# Patient Record
Sex: Female | Born: 1970 | Race: White | Hispanic: No | Smoking: Former smoker
Health system: Southern US, Community
[De-identification: ages and names within clinical notes are randomized; demographics above are authoritative.]

## PROBLEM LIST (undated history)

## (undated) DIAGNOSIS — R102 Pelvic and perineal pain: Secondary | ICD-10-CM

## (undated) DIAGNOSIS — Z973 Presence of spectacles and contact lenses: Secondary | ICD-10-CM

## (undated) DIAGNOSIS — R350 Frequency of micturition: Secondary | ICD-10-CM

## (undated) DIAGNOSIS — F419 Anxiety disorder, unspecified: Secondary | ICD-10-CM

## (undated) DIAGNOSIS — Z8659 Personal history of other mental and behavioral disorders: Secondary | ICD-10-CM

## (undated) DIAGNOSIS — R3915 Urgency of urination: Secondary | ICD-10-CM

## (undated) DIAGNOSIS — F329 Major depressive disorder, single episode, unspecified: Secondary | ICD-10-CM

## (undated) DIAGNOSIS — L309 Dermatitis, unspecified: Secondary | ICD-10-CM

## (undated) DIAGNOSIS — F411 Generalized anxiety disorder: Secondary | ICD-10-CM

## (undated) DIAGNOSIS — F4310 Post-traumatic stress disorder, unspecified: Secondary | ICD-10-CM

## (undated) DIAGNOSIS — K589 Irritable bowel syndrome without diarrhea: Secondary | ICD-10-CM

## (undated) DIAGNOSIS — F32A Depression, unspecified: Secondary | ICD-10-CM

## (undated) DIAGNOSIS — G629 Polyneuropathy, unspecified: Secondary | ICD-10-CM

## (undated) DIAGNOSIS — E785 Hyperlipidemia, unspecified: Secondary | ICD-10-CM

## (undated) DIAGNOSIS — E119 Type 2 diabetes mellitus without complications: Secondary | ICD-10-CM

## (undated) DIAGNOSIS — K219 Gastro-esophageal reflux disease without esophagitis: Secondary | ICD-10-CM

## (undated) HISTORY — DX: Depression, unspecified: F32.A

## (undated) HISTORY — PX: VAGINAL HYSTERECTOMY: SUR661

## (undated) HISTORY — DX: Hyperlipidemia, unspecified: E78.5

## (undated) HISTORY — DX: Anxiety disorder, unspecified: F41.9

## (undated) HISTORY — PX: GYNECOLOGIC CRYOSURGERY: SHX857

## (undated) HISTORY — DX: Major depressive disorder, single episode, unspecified: F32.9

---

## 1997-11-17 ENCOUNTER — Other Ambulatory Visit: Admission: RE | Admit: 1997-11-17 | Discharge: 1997-11-17 | Payer: Self-pay | Admitting: Obstetrics and Gynecology

## 2000-04-02 ENCOUNTER — Other Ambulatory Visit: Admission: RE | Admit: 2000-04-02 | Discharge: 2000-04-02 | Payer: Self-pay | Admitting: Obstetrics and Gynecology

## 2000-06-10 ENCOUNTER — Ambulatory Visit: Admission: RE | Admit: 2000-06-10 | Discharge: 2000-06-10 | Payer: Self-pay | Admitting: Internal Medicine

## 2000-10-31 ENCOUNTER — Inpatient Hospital Stay (HOSPITAL_COMMUNITY): Admission: AD | Admit: 2000-10-31 | Discharge: 2000-10-31 | Payer: Self-pay | Admitting: Obstetrics and Gynecology

## 2000-11-14 ENCOUNTER — Ambulatory Visit (HOSPITAL_COMMUNITY): Admission: RE | Admit: 2000-11-14 | Discharge: 2000-11-14 | Payer: Self-pay | Admitting: Obstetrics and Gynecology

## 2000-12-06 ENCOUNTER — Ambulatory Visit (HOSPITAL_COMMUNITY): Admission: RE | Admit: 2000-12-06 | Discharge: 2000-12-06 | Payer: Self-pay | Admitting: Obstetrics and Gynecology

## 2000-12-06 ENCOUNTER — Encounter: Payer: Self-pay | Admitting: Obstetrics and Gynecology

## 2001-02-06 ENCOUNTER — Ambulatory Visit (HOSPITAL_COMMUNITY): Admission: AD | Admit: 2001-02-06 | Discharge: 2001-02-06 | Payer: Self-pay | Admitting: Obstetrics and Gynecology

## 2001-02-22 ENCOUNTER — Ambulatory Visit (HOSPITAL_COMMUNITY): Admission: RE | Admit: 2001-02-22 | Discharge: 2001-02-22 | Payer: Self-pay | Admitting: Obstetrics and Gynecology

## 2001-02-22 ENCOUNTER — Encounter: Payer: Self-pay | Admitting: Obstetrics and Gynecology

## 2001-03-03 ENCOUNTER — Inpatient Hospital Stay (HOSPITAL_COMMUNITY): Admission: AD | Admit: 2001-03-03 | Discharge: 2001-03-03 | Payer: Self-pay | Admitting: Obstetrics and Gynecology

## 2001-04-01 ENCOUNTER — Inpatient Hospital Stay (HOSPITAL_COMMUNITY): Admission: AD | Admit: 2001-04-01 | Discharge: 2001-04-01 | Payer: Self-pay | Admitting: Obstetrics and Gynecology

## 2001-04-05 ENCOUNTER — Inpatient Hospital Stay (HOSPITAL_COMMUNITY): Admission: AD | Admit: 2001-04-05 | Discharge: 2001-04-05 | Payer: Self-pay | Admitting: Obstetrics and Gynecology

## 2001-04-05 ENCOUNTER — Encounter: Payer: Self-pay | Admitting: Obstetrics and Gynecology

## 2001-05-03 ENCOUNTER — Inpatient Hospital Stay (HOSPITAL_COMMUNITY): Admission: AD | Admit: 2001-05-03 | Discharge: 2001-05-05 | Payer: Self-pay | Admitting: Obstetrics and Gynecology

## 2001-05-03 ENCOUNTER — Encounter (INDEPENDENT_AMBULATORY_CARE_PROVIDER_SITE_OTHER): Payer: Self-pay | Admitting: Specialist

## 2001-05-04 HISTORY — PX: TUBAL LIGATION: SHX77

## 2001-06-13 ENCOUNTER — Other Ambulatory Visit: Admission: RE | Admit: 2001-06-13 | Discharge: 2001-06-13 | Payer: Self-pay | Admitting: Obstetrics and Gynecology

## 2002-07-18 ENCOUNTER — Encounter (INDEPENDENT_AMBULATORY_CARE_PROVIDER_SITE_OTHER): Payer: Self-pay | Admitting: Specialist

## 2002-07-18 ENCOUNTER — Inpatient Hospital Stay (HOSPITAL_COMMUNITY): Admission: EM | Admit: 2002-07-18 | Discharge: 2002-07-19 | Payer: Self-pay | Admitting: Emergency Medicine

## 2002-07-18 ENCOUNTER — Encounter: Payer: Self-pay | Admitting: Emergency Medicine

## 2002-07-18 ENCOUNTER — Encounter: Payer: Self-pay | Admitting: General Surgery

## 2002-07-18 HISTORY — PX: LAPAROSCOPIC CHOLECYSTECTOMY: SUR755

## 2003-07-13 ENCOUNTER — Other Ambulatory Visit: Admission: RE | Admit: 2003-07-13 | Discharge: 2003-07-13 | Payer: Self-pay | Admitting: Obstetrics and Gynecology

## 2004-02-28 ENCOUNTER — Ambulatory Visit (HOSPITAL_COMMUNITY): Admission: RE | Admit: 2004-02-28 | Discharge: 2004-02-28 | Payer: Self-pay | Admitting: Internal Medicine

## 2004-07-11 ENCOUNTER — Other Ambulatory Visit: Admission: RE | Admit: 2004-07-11 | Discharge: 2004-07-11 | Payer: Self-pay | Admitting: Obstetrics and Gynecology

## 2005-02-24 ENCOUNTER — Encounter: Admission: RE | Admit: 2005-02-24 | Discharge: 2005-02-24 | Payer: Self-pay | Admitting: Family Medicine

## 2005-06-26 DIAGNOSIS — E119 Type 2 diabetes mellitus without complications: Secondary | ICD-10-CM | POA: Insufficient documentation

## 2005-11-24 ENCOUNTER — Ambulatory Visit: Payer: Self-pay | Admitting: Family Medicine

## 2005-11-30 ENCOUNTER — Ambulatory Visit: Payer: Self-pay | Admitting: Family Medicine

## 2005-12-14 ENCOUNTER — Encounter: Admission: RE | Admit: 2005-12-14 | Discharge: 2006-03-14 | Payer: Self-pay | Admitting: Family Medicine

## 2006-01-01 ENCOUNTER — Ambulatory Visit: Payer: Self-pay | Admitting: Family Medicine

## 2006-01-15 ENCOUNTER — Ambulatory Visit: Payer: Self-pay | Admitting: Family Medicine

## 2006-03-04 ENCOUNTER — Inpatient Hospital Stay (HOSPITAL_COMMUNITY): Admission: AD | Admit: 2006-03-04 | Discharge: 2006-03-04 | Payer: Self-pay | Admitting: Obstetrics and Gynecology

## 2006-03-20 ENCOUNTER — Ambulatory Visit: Payer: Self-pay | Admitting: Family Medicine

## 2006-05-29 ENCOUNTER — Ambulatory Visit: Payer: Self-pay | Admitting: Family Medicine

## 2006-07-09 ENCOUNTER — Encounter: Admission: RE | Admit: 2006-07-09 | Discharge: 2006-07-09 | Payer: Self-pay | Admitting: Family Medicine

## 2006-07-09 ENCOUNTER — Ambulatory Visit: Payer: Self-pay | Admitting: Family Medicine

## 2006-10-09 ENCOUNTER — Ambulatory Visit: Payer: Self-pay | Admitting: Family Medicine

## 2006-11-28 ENCOUNTER — Ambulatory Visit: Payer: Self-pay | Admitting: Family Medicine

## 2007-03-13 ENCOUNTER — Ambulatory Visit: Payer: Self-pay | Admitting: Family Medicine

## 2007-03-15 ENCOUNTER — Ambulatory Visit: Payer: Self-pay | Admitting: Family Medicine

## 2007-05-01 ENCOUNTER — Ambulatory Visit: Payer: Self-pay | Admitting: Family Medicine

## 2007-07-12 ENCOUNTER — Ambulatory Visit: Payer: Self-pay | Admitting: Family Medicine

## 2007-07-12 ENCOUNTER — Encounter: Admission: RE | Admit: 2007-07-12 | Discharge: 2007-07-12 | Payer: Self-pay | Admitting: Family Medicine

## 2007-07-17 ENCOUNTER — Ambulatory Visit: Payer: Self-pay | Admitting: Family Medicine

## 2007-07-22 ENCOUNTER — Ambulatory Visit: Payer: Self-pay | Admitting: Family Medicine

## 2007-07-22 ENCOUNTER — Encounter: Admission: RE | Admit: 2007-07-22 | Discharge: 2007-07-22 | Payer: Self-pay | Admitting: Family Medicine

## 2007-07-24 ENCOUNTER — Encounter: Admission: RE | Admit: 2007-07-24 | Discharge: 2007-07-24 | Payer: Self-pay | Admitting: Family Medicine

## 2007-10-01 ENCOUNTER — Ambulatory Visit: Payer: Self-pay | Admitting: Family Medicine

## 2007-10-14 ENCOUNTER — Ambulatory Visit: Payer: Self-pay | Admitting: Family Medicine

## 2007-10-31 ENCOUNTER — Ambulatory Visit: Payer: Self-pay | Admitting: Family Medicine

## 2007-11-14 ENCOUNTER — Ambulatory Visit: Payer: Self-pay | Admitting: Family Medicine

## 2007-11-19 ENCOUNTER — Ambulatory Visit: Payer: Self-pay | Admitting: Family Medicine

## 2008-02-20 ENCOUNTER — Ambulatory Visit: Payer: Self-pay | Admitting: Family Medicine

## 2008-04-02 ENCOUNTER — Ambulatory Visit: Payer: Self-pay | Admitting: Family Medicine

## 2008-06-12 ENCOUNTER — Ambulatory Visit: Payer: Self-pay | Admitting: Family Medicine

## 2008-08-21 ENCOUNTER — Ambulatory Visit: Payer: Self-pay | Admitting: Nurse Practitioner

## 2008-08-21 DIAGNOSIS — F410 Panic disorder [episodic paroxysmal anxiety] without agoraphobia: Secondary | ICD-10-CM | POA: Insufficient documentation

## 2008-08-21 DIAGNOSIS — F17200 Nicotine dependence, unspecified, uncomplicated: Secondary | ICD-10-CM | POA: Insufficient documentation

## 2008-08-21 DIAGNOSIS — E7800 Pure hypercholesterolemia, unspecified: Secondary | ICD-10-CM | POA: Insufficient documentation

## 2008-08-21 DIAGNOSIS — J209 Acute bronchitis, unspecified: Secondary | ICD-10-CM | POA: Insufficient documentation

## 2008-08-21 DIAGNOSIS — F341 Dysthymic disorder: Secondary | ICD-10-CM | POA: Insufficient documentation

## 2008-08-21 LAB — CONVERTED CEMR LAB
Blood in Urine, dipstick: NEGATIVE
Glucose, Urine, Semiquant: NEGATIVE
Ketones, urine, test strip: NEGATIVE
Nitrite: NEGATIVE
Urobilinogen, UA: 0.2
WBC Urine, dipstick: NEGATIVE

## 2008-08-24 ENCOUNTER — Encounter (INDEPENDENT_AMBULATORY_CARE_PROVIDER_SITE_OTHER): Payer: Self-pay | Admitting: Nurse Practitioner

## 2008-08-24 LAB — CONVERTED CEMR LAB
BUN: 17 mg/dL (ref 6–23)
CO2: 25 meq/L (ref 19–32)
Calcium: 9.2 mg/dL (ref 8.4–10.5)
Chloride: 104 meq/L (ref 96–112)
Creatinine, Ser: 0.73 mg/dL (ref 0.40–1.20)
Glucose, Bld: 203 mg/dL — ABNORMAL HIGH (ref 70–99)
Hemoglobin: 13.6 g/dL (ref 12.0–15.0)
Neutrophils Relative %: 53 % (ref 43–77)
Platelets: 197 10*3/uL (ref 150–400)
Potassium: 4.5 meq/L (ref 3.5–5.3)
RBC: 4.34 M/uL (ref 3.87–5.11)
RDW: 12.8 % (ref 11.5–15.5)
TSH: 0.652 microintl units/mL (ref 0.350–4.50)
Total Bilirubin: 0.3 mg/dL (ref 0.3–1.2)
WBC: 7.1 10*3/uL (ref 4.0–10.5)

## 2008-08-28 ENCOUNTER — Ambulatory Visit: Payer: Self-pay | Admitting: Nurse Practitioner

## 2008-09-01 ENCOUNTER — Encounter (INDEPENDENT_AMBULATORY_CARE_PROVIDER_SITE_OTHER): Payer: Self-pay | Admitting: Nurse Practitioner

## 2008-09-11 ENCOUNTER — Ambulatory Visit: Payer: Self-pay | Admitting: Nurse Practitioner

## 2008-09-11 LAB — CONVERTED CEMR LAB
Cholesterol, target level: 200 mg/dL
HDL goal, serum: 40 mg/dL
LDL Goal: 100 mg/dL

## 2008-09-24 ENCOUNTER — Ambulatory Visit: Payer: Self-pay | Admitting: Nurse Practitioner

## 2008-09-28 ENCOUNTER — Encounter (INDEPENDENT_AMBULATORY_CARE_PROVIDER_SITE_OTHER): Payer: Self-pay | Admitting: Nurse Practitioner

## 2008-09-28 LAB — CONVERTED CEMR LAB
Cholesterol: 196 mg/dL (ref 0–200)
HDL: 48 mg/dL (ref >39)
LDL Cholesterol: 127 mg/dL — ABNORMAL HIGH (ref 0–99)
Total CHOL/HDL Ratio: 4.1
Triglycerides: 107 mg/dL (ref <150)
VLDL: 21 mg/dL (ref 0–40)

## 2008-10-21 ENCOUNTER — Encounter (INDEPENDENT_AMBULATORY_CARE_PROVIDER_SITE_OTHER): Payer: Self-pay | Admitting: Nurse Practitioner

## 2008-10-22 ENCOUNTER — Encounter (INDEPENDENT_AMBULATORY_CARE_PROVIDER_SITE_OTHER): Payer: Self-pay | Admitting: Nurse Practitioner

## 2008-11-12 ENCOUNTER — Ambulatory Visit: Payer: Self-pay | Admitting: Nurse Practitioner

## 2008-11-12 LAB — CONVERTED CEMR LAB: Blood Glucose, Fingerstick: 68

## 2008-11-27 ENCOUNTER — Telehealth (INDEPENDENT_AMBULATORY_CARE_PROVIDER_SITE_OTHER): Payer: Self-pay | Admitting: Nurse Practitioner

## 2008-11-27 ENCOUNTER — Ambulatory Visit: Payer: Self-pay | Admitting: Nurse Practitioner

## 2008-11-27 DIAGNOSIS — R109 Unspecified abdominal pain: Secondary | ICD-10-CM | POA: Insufficient documentation

## 2008-11-27 LAB — CONVERTED CEMR LAB
Blood Glucose, Fingerstick: 90
Ketones, urine, test strip: NEGATIVE
Nitrite: NEGATIVE
Specific Gravity, Urine: >=1.03
Urobilinogen, UA: 0.2
WBC Urine, dipstick: NEGATIVE

## 2008-11-28 ENCOUNTER — Encounter (INDEPENDENT_AMBULATORY_CARE_PROVIDER_SITE_OTHER): Payer: Self-pay | Admitting: Nurse Practitioner

## 2009-01-07 ENCOUNTER — Telehealth (INDEPENDENT_AMBULATORY_CARE_PROVIDER_SITE_OTHER): Payer: Self-pay | Admitting: Nurse Practitioner

## 2009-01-07 ENCOUNTER — Ambulatory Visit: Payer: Self-pay | Admitting: Nurse Practitioner

## 2009-01-07 LAB — CONVERTED CEMR LAB
Albumin: 4.2 g/dL (ref 3.5–5.2)
Alkaline Phosphatase: 51 units/L (ref 39–117)
Bilirubin, Direct: 0.1 mg/dL (ref 0.0–0.3)
HDL: 45 mg/dL (ref >39)
Total Bilirubin: 0.5 mg/dL (ref 0.3–1.2)
Triglycerides: 103 mg/dL (ref <150)
VLDL: 21 mg/dL (ref 0–40)

## 2009-01-08 ENCOUNTER — Encounter (INDEPENDENT_AMBULATORY_CARE_PROVIDER_SITE_OTHER): Payer: Self-pay | Admitting: Nurse Practitioner

## 2009-01-14 ENCOUNTER — Telehealth (INDEPENDENT_AMBULATORY_CARE_PROVIDER_SITE_OTHER): Payer: Self-pay | Admitting: Internal Medicine

## 2009-02-22 ENCOUNTER — Ambulatory Visit: Payer: Self-pay | Admitting: Nurse Practitioner

## 2009-02-22 DIAGNOSIS — R21 Rash and other nonspecific skin eruption: Secondary | ICD-10-CM | POA: Insufficient documentation

## 2009-02-22 LAB — CONVERTED CEMR LAB: Blood Glucose, Fingerstick: 89

## 2009-03-04 ENCOUNTER — Telehealth (INDEPENDENT_AMBULATORY_CARE_PROVIDER_SITE_OTHER): Payer: Self-pay | Admitting: Nurse Practitioner

## 2009-03-05 ENCOUNTER — Ambulatory Visit: Payer: Self-pay | Admitting: Nurse Practitioner

## 2009-03-05 DIAGNOSIS — J309 Allergic rhinitis, unspecified: Secondary | ICD-10-CM | POA: Insufficient documentation

## 2009-04-20 ENCOUNTER — Emergency Department (HOSPITAL_COMMUNITY): Admission: EM | Admit: 2009-04-20 | Discharge: 2009-04-20 | Payer: Self-pay | Admitting: Emergency Medicine

## 2009-04-30 ENCOUNTER — Telehealth (INDEPENDENT_AMBULATORY_CARE_PROVIDER_SITE_OTHER): Payer: Self-pay | Admitting: Nurse Practitioner

## 2009-07-29 ENCOUNTER — Other Ambulatory Visit: Admission: RE | Admit: 2009-07-29 | Discharge: 2009-07-29 | Payer: Self-pay | Admitting: Family Medicine

## 2009-07-29 ENCOUNTER — Ambulatory Visit: Payer: Self-pay | Admitting: Family Medicine

## 2009-10-21 ENCOUNTER — Ambulatory Visit: Payer: Self-pay | Admitting: Family Medicine

## 2009-12-01 ENCOUNTER — Ambulatory Visit: Payer: Self-pay | Admitting: Family Medicine

## 2009-12-01 ENCOUNTER — Encounter: Admission: RE | Admit: 2009-12-01 | Discharge: 2009-12-01 | Payer: Self-pay | Admitting: Family Medicine

## 2010-02-25 ENCOUNTER — Ambulatory Visit: Payer: Self-pay | Admitting: Family Medicine

## 2010-03-22 ENCOUNTER — Ambulatory Visit: Payer: Self-pay | Admitting: Family Medicine

## 2010-05-18 ENCOUNTER — Ambulatory Visit: Payer: Self-pay | Admitting: Family Medicine

## 2010-07-04 ENCOUNTER — Ambulatory Visit
Admission: RE | Admit: 2010-07-04 | Discharge: 2010-07-04 | Payer: Self-pay | Source: Home / Self Care | Attending: Family Medicine | Admitting: Family Medicine

## 2010-07-21 ENCOUNTER — Ambulatory Visit: Admit: 2010-07-21 | Payer: Self-pay | Admitting: Family Medicine

## 2010-07-27 ENCOUNTER — Ambulatory Visit (INDEPENDENT_AMBULATORY_CARE_PROVIDER_SITE_OTHER): Payer: BC Managed Care – PPO | Admitting: Physician Assistant

## 2010-07-27 DIAGNOSIS — J069 Acute upper respiratory infection, unspecified: Secondary | ICD-10-CM

## 2010-07-28 ENCOUNTER — Ambulatory Visit: Payer: Self-pay | Admitting: Family Medicine

## 2010-09-29 LAB — GLUCOSE, CAPILLARY: Glucose-Capillary: 104 mg/dL — ABNORMAL HIGH (ref 70–99)

## 2010-10-19 ENCOUNTER — Encounter: Payer: BC Managed Care – PPO | Admitting: Family Medicine

## 2010-11-10 ENCOUNTER — Encounter: Payer: Self-pay | Admitting: Medical

## 2010-11-11 NOTE — H&P (Signed)
NAME:  [name], [name]                        ACCOUNT NO.:  [id number]   MEDICAL RECORD NO.:  [id number]                   PATIENT TYPE:  INP   LOCATION:  5007                                 FACILITY:  [facility]   PHYSICIAN:  Angelia Mould. Derrell Lolling, [name]             DATE OF BIRTH:  June 29, 1970   DATE OF ADMISSION:  07/18/2002  DATE OF DISCHARGE:                                HISTORY & PHYSICAL   CHIEF COMPLAINT:  Abdominal pain.   HISTORY OF PRESENT ILLNESS:  This is a 40 year old white female who had the  onset of epigastric pain at 10 p.m. last night after eating pizza for  supper.  The pain was steady in character, progressive in intensity, not  colicky.  She had nausea and vomited four times.  She took antacids but with  no relief.  She denies any recent diarrhea or constipation.  She has not had  any prior similar episodes but does have some mild indigestion and fatty  food intolerance.  She denies fever or chills.  She came to the emergency  room because of unrelenting pain.  She has been given narcotic analgesics  and the pain has essentially resolved now.   PAST MEDICAL HISTORY:  1. She had a bilateral tubal ligation.  2. She has had two pregnancies and two vaginal deliveries.  3. She has some anxiety.  4. She states she has a heart murmur and had an echocardiogram by Dr. Jarome Matin, which was unremarkable.  5. She has some mild gastroesophageal reflux disease.   CURRENT MEDICATIONS:  Effexor 225 mg q.d.   ALLERGIES:  CODEINE causes nausea.   SOCIAL HISTORY:  The patient is separated and has two children ages 1 and 1.  She lives in [place].  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  Denies the use of alcohol or tobacco.   FAMILY HISTORY:  Mother living, age 62, has hypertension.  Father living,  age 16, no medical problems.  One brother living and well.  One grandmother  with cancer of the thyroid.   REVIEW OF SYSTEMS:   GENERAL:  She is overweight.  EYES:  No visual problems  or eye problems.  ENT:  Denies any significant oral or pharyngeal problems.  Had mild sore throat yesterday that has resolved.  LUNGS:  Denies cough,  asthma, bronchitis, or other pulmonary problems.  HEART:  History of heart  murmur as above, otherwise asymptomatic.  ABDOMEN:  Mild gastroesophageal  reflux symptoms controlled with Tums.  No history of any ulcer, hepatitis,  liver disease, pancreatitis, colitis, Crohn's disease, or any other GI  problems.  GYN:  No active problems.  MUSCULOSKELETAL:  No joint or bone  pain.  NEUROLOGIC:  No history of seizures or syncopal episodes.   PHYSICAL EXAMINATION:  GENERAL:  Pleasant overweight young white female in  no distress  at this time.  She just received her second dose of IV narcotic.  VITAL SIGNS:  Temperature 97.9, pulse 90, respirations 18, blood pressure  115/61.  EYES:  Sclerae clear.  Extraocular movements intact.  ENT:  Oropharynx clear.  No lesions and no inflammation.  NECK:  Supple, nontender, no mass, no bruit, no jugular venous distention.  HEART:  Regular rhythm and rate, perhaps a very faint systolic murmur.  LUNGS:  Clear to auscultation.  BREASTS:  Not examined.  ABDOMEN:  Somewhat obese, soft, nontender, minimal bowel sounds, not  distended, no mass.  EXTREMITIES:  Free range of motion, no deformity, no edema, good pulses.  NEUROLOGIC:  Grossly within normal limits.   ADMISSION DATA:  ULTRASOUND:  Shows multiple gallstones with several  gallstones impacted in the gallbladder neck.   LABORATORY DATA:  White blood cell count is 12.2.  Liver function tests are  normal.  Serum amylase is 158.  Lipase is normal.   IMPRESSION:  1. Acute cholecystitis with cholelithiasis.  2. History of anxiety.  3. History of benign heart murmur.   PLAN:  1. The patient will be admitted to the hospital and will be taken to the     operating room today for a laparoscopic  cholecystectomy with     intraoperative cholangiogram.  2. I have discussed the indications and details of surgery with her.  The     risks and complications have been outlined, including but not limited to     bleeding, infection, conversion to open laparotomy, injury to the main     bile duct with major reconstructive surgery, injury to adjacent organs     such as the small intestine, large intestine, liver or stomach with     reconstruction, wound problems such as infection or hernia, and other     unforeseen problems.  She seems to understand these issues well.  At this     time, all of her questions are answered.  She would clearly like to go     ahead and have this done now and agrees with this plan.                                               Angelia Mould. Derrell Lolling, [name]    HMI/MEDQ  D:  07/18/2002  T:  07/18/2002  Job:  [id number]   cc:   Barry Dienes. Eloise Harman, [name]  [address]  [place]  [place] [address]  Fax: [phone]

## 2010-11-11 NOTE — Op Note (Signed)
[facility]  Patient:    [name], [name] Visit Number: [id number] MRN: [id number]          Service Type: OBS Location: 910A 9121 01 Attending Physician:  Oliver Pila Dictated by:   Malachi Pro. Ambrose Mantle, [name] Proc. Date: 05/04/01 Admit Date:  05/03/2001 Discharge Date: 05/05/2001                             Operative Report  PREOPERATIVE DIAGNOSES:       Voluntary sterilization.  POSTOPERATIVE DIAGNOSES:      Voluntary sterilization.  PROCEDURE:                    Bilateral tubal ligation.  OPERATOR:                     Malachi Pro. Ambrose Mantle, [name]  ANESTHESIA:                   Epidural.  PROCEDURE:                    The patient was brought to the operating room and placed under satisfactory epidural anesthesia.  The abdomen was prepped with Betadine solution and draped as a sterile field.  The patient was quite anxious, verging on having a panic attack.  We proceeded with the procedure. A semilunar incision was made in the inferior portion of the umbilicus.  It was carried down to the fascia.  The fascia was incised.  Peritoneum was opened.  I visualized both tubes and traced both tubes to their fimbriated ends.  I elevated the mid portion of both tubes, created a window in the mesosalpinx with the Bovie, and then placed two ties of 0 plain catgut proximally and distally in each tube and excised the central segment.  I confirmed that both were tubal segments.  There was no bleeding.  The ties were cut and the abdominal wall was closed.  I only saw a superior portion of the left ovary and a superior portion of the right ovary.  The abdominal wall was closed with interrupted figure-of-eight sutures of 0 Vicryl on the fascia and peritoneum.  I did not close the subcuticular tissue.  I placed 3-0 plain catgut in the skin.  The patient seemed to tolerate the procedure well.  Blood loss was less than 5 cc.  Sponge and needle counts were correct.   She was returned to recovery in satisfactory condition. Dictated by:   Malachi Pro. Ambrose Mantle, [name] Attending Physician:  Oliver Pila DD:  05/04/01 TD:  05/06/01 Job: [id number]

## 2010-11-11 NOTE — H&P (Signed)
[facility]  Patient:    [name], [name] Visit Number: [id number] MRN: [id number]          Service Type: OBS Location: 910A 9121 01 Attending Physician:  Oliver Pila Dictated by:   Malachi Pro. Ambrose Mantle, [name] Admit Date:  05/03/2001 Discharge Date: 05/05/2001                           History and Physical  HISTORY:                      A 40 year old white female para 1-0-0-1, gravida 2 with [facility] May 07, 2001 was scheduled for induction on May 03, 2001. However, she presented to labor and delivery at 1:30 a.m. with contractions every three minutes and the cervix 3 cm dilated.  She had spontaneous rupture of membranes after admission at 4:30 a.m.  Pregnancy was complicated by large for gestational age infant.  Estimated fetal weight by Leopolds was approximately 8.5-9 pounds.  Also had a history of depression.  She stopped Effexor at 14 weeks.  ALLERGIES:                    CODEINE, DOXYCYCLINE.  PAST OBSTETRICAL HISTORY:     Vaginal delivery in 1996 of an 8 pound 9 ounce infant.  PAST GYNECOLOGICAL HISTORY:   Cryo surgery at 40 years of age.  PAST MEDICAL HISTORY:         Depression.  PAST SURGICAL HISTORY:        None.  PHYSICAL EXAMINATION  VITAL SIGNS:                  Normal.  HEENT:                        Normal.  LUNGS:                        Clear.  HEART:                        Normal sinus and sounds.  ABDOMEN:                      Soft.  Fundal height now is postpartum at the umbilicus.  PELVIC:                       Deferred postpartum.  ADMITTING IMPRESSION:         Intrauterine pregnancy that was delivered 9 pound 0 ounce infant.  Voluntary sterilization.  The patient has been counseled thoroughly about the risks and benefits of proceeding with tubal ligation.  I told her that my personal feeling is that a couple should not have sterilization within a year of the delivery of the last child but she wants  to proceed.  She has been counseled again and she continues to want to proceed. Dictated by:   Malachi Pro. Ambrose Mantle, [name] Attending Physician:  Oliver Pila DD:  05/04/01 TD:  05/06/01 Job: [id number]

## 2010-11-11 NOTE — Op Note (Signed)
NAME:  [name], [name]                        ACCOUNT NO.:  [id number]   MEDICAL RECORD NO.:  [id number]                   PATIENT TYPE:  INP   LOCATION:  1845                                 FACILITY:  [facility]   PHYSICIAN:  Angelia Mould. Derrell Lolling, [name]             DATE OF BIRTH:  03/04/1971   DATE OF PROCEDURE:  07/18/2002  DATE OF DISCHARGE:                                 OPERATIVE REPORT   PREOPERATIVE DIAGNOSIS:  Acute cholecystitis with cholelithiasis.   POSTOPERATIVE DIAGNOSIS:  Acute cholecystitis with cholelithiasis.   OPERATION PERFORMED:  Laparoscopic cholecystectomy with intraoperative  cholangiogram.   SURGEON:  Angelia Mould. Derrell Lolling, [name]   FIRST ASSISTANT:  Ollen Gross. Carolynne Edouard, [name]   OPERATIVE INDICATIONS:  This is a 40 year old white female who presented to  the emergency room early this morning following a several-hour history of  progressive, steady epigastric pain and vomiting.  Blood work showed a white  count of 12,400, normal liver function tests, and negative pregnancy test.  A gallbladder ultrasound showed numerous gallstones, some of which were  impacted in the neck of the gallbladder.  Her abdominal pain improved with  narcotics.  I offered her a cholecystectomy, and she stated that she would  like to have that done at this time.  She was brought to the operating room  for that surgery.   OPERATIVE FINDINGS:  The gallbladder was acutely inflamed with significant  edema of the wall.  The liver looked healthy.  The hepatic artery was riding  high, and the cystic artery was somewhat short, but we were able to dissect  that out without too much trouble.  The anatomy of the cystic ducts, common  bile duct, and intrahepatic ducts were conventional. The cholangiogram was  normal, showing no filling defects, no obstruction, and normal intrahepatic  and extrahepatic bile duct anatomy.  The small intestine, large intestine,  and omentum were grossly normal, although the  omentum obscured much  visualization.   OPERATIVE TECHNIQUE:  Following the induction of general endotracheal  anesthesia, the patient's abdomen was prepped and draped in a sterile  fashion.  0.5% Marcaine with epinephrine was used as a local infiltration  anesthetic.  A transverse incision was made at the lower end of the  umbilicus, through the previous laparoscopy scar.  The fascia was incised in  the midline and the abdominal cavity entered under direct vision.  A 10 mm  Hasson trocar was inserted and secured with a pursestring suture of 0  Vicryl.  Pneumoperitoneum was treated.  The video camera was inserted with  visualization and findings as described above.  A 10 mm trocar was placed in  the subxiphoid region and two 5 mm trocars placed in the right midabdomen.  The gallbladder fundus was elevated.  We then dissected some adhesions off  of the infundibulum of the gallbladder and retracted that.  We then further  dissected adhesions down and  then carefully dissected out the cystic duct.  Neither the hepatic artery or an aberrant right hepatic artery was passing  behind the cystic duct, and we very carefully dissected the cystic duct away  from this.  A cholangiogram catheter was inserted into the cystic duct.  The  cholangiogram was obtained using the C-arm.  The cholangiogram was normal.  There was no filling defect.  There was prompt flow of contrast into the  duodenum, and the bile duct anatomy was normal.  The cholangiogram catheter  was removed.  The cystic duct was secured with multiple metal clips and  divided.  We then carefully dissected the cystic artery away from the  hepatic artery and isolated it with metal clips and divided it.  We were  then able to dissect the hepatic artery down away from the gallbladder and  the gallbladder up without too much trouble.  The gallbladder was dissected  from its bed with electrocautery and removed through the umbilical port.  The  operative field and subphrenic space was copiously irrigated with  saline.  At the completion of the case, there was no bleeding and no bile  leak whatsoever.  The irrigation fluid was completely clear.  The trocars  were removed under direct vision, and there was no bleeding from the trocar  sites.  Pneumoperitoneum was released.  The fascia at the umbilicus was  closed with 0 Vicryl sutures.  The skin incisions were closed with  subcuticular sutures of 4-0 Vicryl and Steri-Strips.  Clean bandages were  placed, and the patient was taken to the recovery room in a stable  condition.   ESTIMATED BLOOD LOSS:  About 10 cc.   COMPLICATIONS:  None.   SPONGE, NEEDLE, AND INSTRUMENT COUNTS:  Correct.                                               Angelia Mould. Derrell Lolling, [name]    HMI/MEDQ  D:  07/18/2002  T:  07/19/2002  Job:  [id number]   cc:   Barry Dienes. Eloise Harman, [name]  [address]  [place]  [place] [address]  Fax: [phone]

## 2010-11-11 NOTE — Discharge Summary (Signed)
[facility]  Patient:    [name], [name] Visit Number: [id number] MRN: [id number]          Service Type: OBS Location: 910A 9121 01 Attending Physician:  Oliver Pila Dictated by:   Malachi Pro. Ambrose Mantle, [name] Admit Date:  05/03/2001 Discharge Date: 05/05/2001                             Discharge Summary  REASON FOR ADMISSION:         This is a 40 year old white female, para 1, 0-0-1, gravida 2, admitted in labor. The patients blood group and type was 0- with a negative antibody. RPR was nonreactive.  Rubella immune. Hepatitis B surface antigen negative. HIV negative.  GC and Chlamydia negative. Group B strep negative.  One-hour Glucola was 178.  Three-hour GGT was 94, 182, 161, and 129.  Her prenatal course uncomplicated except for large for gestational age infant and history of depression. She had stopped Effexor at 14 weeks.  ALLERGIES:                    The patient was allergic to CODEINE and DOXYCYCLINE.  PAST MEDICAL HISTORY:         In 1996, she had had a spontaneous vaginal delivery of an 8 lb, 9 oz, infant.  She had had cryosurgery at age 49, history of depression.  No surgical history.  HOSPITAL COURSE:              The patient had been admitted with the cervix 3 cm.  She progressed to 9 cm and reached complete dilatation and pushed well with a spontaneous vaginal delivery of a vigorous female infant over a small, second-degree laceration by Alvino Chapel, [name]  The infant weighed 9 lb, 0 oz.  Apgars were 8 and 9 at 1 and 5 minutes.  Placenta delivered spontaneously.  A smaller second-degree laceration repaired with 2-0 Vicryl. The cervix and rectum were intact.  Blood loss was about 400 cc.  The patient requested tubal ligation.  I spoke to the patient on two occasions and emphasized the disadvantages, as well as advantages as well as advantages of proceeding with tubal ligation.  She wanted to proceed, so on the first  postpartum day, I performed a tubal ligation under epidural anesthesia.  On the first postop and second postpartum day, she was doing well and was ready for discharge.  Hemoglobin on admission was 9.1, hematocrit 27.0, white count 10,600, and platelet count 202,000.  The baby was Rh-, and the patient was not a candidate for RhoGAM.  FINAL DIAGNOSES:              1. Intrauterine pregnancy at 39 weeks,                                  delivered vertex.                               2. Voluntary sterilization.                               3. Spontaneous vaginal delivery.  4. Bilateral tubal ligation.  FINAL CONDITION:              Improved.  INSTRUCTIONS:                 Include our regular discharge instruction book. The patient is advised to avoid heavy lifting or strenuous activity.  Call with any fever above 100.4 degrees.  Call with any unusually heavy bleeding. Darvocet-N 100 16 tablets, one every 4-6 hours as needed for discharge, and the patient is advised to return in 10-14 days for followup examination. Dictated by:   Malachi Pro. Ambrose Mantle, [name] Attending Physician:  Oliver Pila DD:  05/05/01 TD:  05/06/01 Job: [id number]

## 2010-11-14 ENCOUNTER — Ambulatory Visit (INDEPENDENT_AMBULATORY_CARE_PROVIDER_SITE_OTHER): Payer: BC Managed Care – PPO | Admitting: Medical

## 2010-11-14 ENCOUNTER — Encounter: Payer: Self-pay | Admitting: Medical

## 2010-11-14 DIAGNOSIS — E785 Hyperlipidemia, unspecified: Secondary | ICD-10-CM

## 2010-11-14 DIAGNOSIS — F17200 Nicotine dependence, unspecified, uncomplicated: Secondary | ICD-10-CM

## 2010-11-14 DIAGNOSIS — E119 Type 2 diabetes mellitus without complications: Secondary | ICD-10-CM

## 2010-11-14 DIAGNOSIS — B360 Pityriasis versicolor: Secondary | ICD-10-CM

## 2010-11-14 DIAGNOSIS — Z0000 Encounter for general adult medical examination without abnormal findings: Secondary | ICD-10-CM

## 2010-11-14 LAB — POCT URINALYSIS DIPSTICK
Glucose, UA: NEGATIVE
Ketones, UA: NEGATIVE
Spec Grav, UA: 1.005
Urobilinogen, UA: NEGATIVE
pH, UA: 7

## 2010-11-14 LAB — CBC WITH DIFFERENTIAL/PLATELET
Eosinophils Relative: 3 % (ref 0–5)
HCT: 40.6 % (ref 36.0–46.0)
Hemoglobin: 13.5 g/dL (ref 12.0–15.0)
Lymphocytes Relative: 33 % (ref 12–46)
Lymphs Abs: 1.7 10*3/uL (ref 0.7–4.0)
Neutro Abs: 2.8 10*3/uL (ref 1.7–7.7)
Neutrophils Relative %: 52 % (ref 43–77)
RBC: 4.37 MIL/uL (ref 3.87–5.11)
RDW: 13.2 % (ref 11.5–15.5)
WBC: 5.3 10*3/uL (ref 4.0–10.5)

## 2010-11-14 LAB — COMPREHENSIVE METABOLIC PANEL
Calcium: 9.7 mg/dL (ref 8.4–10.5)
Chloride: 101 mEq/L (ref 96–112)
Creat: 0.64 mg/dL (ref 0.40–1.20)
Potassium: 4.4 mEq/L (ref 3.5–5.3)
Total Bilirubin: 0.5 mg/dL (ref 0.3–1.2)
Total Protein: 7 g/dL (ref 6.0–8.3)

## 2010-11-14 LAB — POCT GLYCOSYLATED HEMOGLOBIN (HGB A1C): Hemoglobin A1C: 5.4

## 2010-11-14 LAB — LIPID PANEL
LDL Cholesterol: 125 mg/dL — ABNORMAL HIGH (ref 0–99)
Total CHOL/HDL Ratio: 4.3 Ratio

## 2010-11-14 MED ORDER — PRAVASTATIN SODIUM 40 MG PO TABS: 40.0000 mg | ORAL_TABLET | Freq: Every day | ORAL | Status: DC

## 2010-11-14 MED ORDER — KETOCONAZOLE 2 % EX SHAM: MEDICATED_SHAMPOO | CUTANEOUS | Status: AC

## 2010-11-14 MED ORDER — METFORMIN HCL 1000 MG PO TABS: 1000.0000 mg | ORAL_TABLET | Freq: Two times a day (BID) | ORAL | Status: DC

## 2010-11-14 NOTE — Progress Notes (Signed)
Subjective:   HPI [name] is a 40 year old female here today for a complete physical. Overall she reports doing well, tries to eat pretty healthy, but does not exercise. She has lost some weight with diet changes. She is a smoker. She has had recent medical care through her gynecologist, and has been diagnosed with a rectocele and cystocele. She has followup pending with surgery for possible repair. She also sees her psychiatrist and counselor regularly for anxiety and depression.  Here for follow up of diabetes. Current symptoms include: Occasional hypoglycemia with Kombiglyze. Patient denies foot ulcerations, hyperglycemia, increased appetite, nausea, paresthesia of the feet and polydipsia. Evaluation to date has included: hemoglobin A1C.  Her last visit for diabetes was in January 2012. Her last hemoglobin A1c was 5.5% September 2011. Home sugars: BGs consistently in an acceptable range.  Last dilated eye exam over one year ago  She is also here today for followup on cholesterol. She takes pravastatin 40 mg, however she noticed that she has been out of this medicine for 6 months.  Thus, has not been taking it. She does try to eat fairly healthy. She has been using coconut milk to help with cholesterol and potassium. As she reports her mother runs a little potassium and uses coconut milk.  The following portions of the patient's history were reviewed and updated as appropriate: allergies, current medications, past family history, past medical history, past social history, past surgical history and problem list.  Past Medical History  Diagnosis Date  . Obesity   . Dyslipidemia   . Diabetes mellitus   . Hyperlipidemia   . Obesity   . Tobacco use disorder   . Anxiety     Dr. Donnie Aho, [facility]  . Depression     Clinical psychologist, Erlinda Hong  . ADD (attention deficit disorder)   . Cystocele     sees gyn, Dr. Reinaldo Meeker  . Rectocele      Review of  Systems Constitutional: denies fever, chills, sweats, unexpected weight change, anorexia, fatigue Allergy: +head pressure, itchy nose, sneezing, congestion;  Dermatology: denies changing moles, rash, lumps, new worrisome lesions ENT: +occasional sinus pain; no runny nose, ear pain, sore throat, hoarseness, teeth pain, tinnitus, hearing loss, epistaxis Cardiology: +occasional palpitations x years; denies chest pain, edema, orthopnea, paroxysmal nocturnal dyspnea Respiratory: denies cough, shortness of breath, dyspnea on exertion, wheezing, hemoptysis Gastroenterology: +constipation, occasional abdominal pain with constipation; denies nausea, vomiting, diarrhea, blood in stool, changes in bowel movement, dysphagia Hematology: denies bleeding or bruising problems Musculoskeletal: denies arthralgias, myalgias, joint swelling, back pain, neck pain, cramping, gait changes Ophthalmology: denies vision changes, eye redness, itching, discharge Urology: denies dysuria, difficulty urinating, hematuria, urinary frequency, urgency, incontinence Neurology: no headache, weakness, tingling, numbness, speech abnormality, memory loss, falls, dizziness Psychology: +anxiety, mood problems, sees psychiatry     Objective:   Physical Exam  General appearance: alert, no distress, WD/WN, white female , looks stated age, obese Skin: Upper chest and shoulders with scattered round, flat, hypopigmented 4-5 mm lesions that appears to be tinea versicolor, right lower back with raised pink/purple uniform appearing, papular lesion that appears to be a benign finding, otherwise a few scattered benign appearing macules throughout. HEENT: normocephalic, conjunctiva/corneas normal, sclerae anicteric, PERRLA, EOMi, nares patent, no discharge or erythema, pharynx normal Oral cavity: MMM, tongue normal, teeth normal Neck: supple, no lymphadenopathy, no thyromegaly, no masses, normal ROM Chest: non tender, normal shape and  expansion Heart: RRR, normal S1, S2, no murmurs Lungs: CTA bilaterally, no  wheezes, rhonchi, or rales Abdomen: +bs, soft, non tender, non distended, no masses, no hepatomegaly, no splenomegaly, no bruits Back: non tender, normal ROM, no scoliosis Musculoskeletal: upper extremities non tender, no obvious deformity, normal ROM throughout, lower extremities non tender, no obvious deformity, normal ROM throughout Extremities: no edema, no cyanosis, no clubbing Pulses: 2+ symmetric, upper and lower extremities, normal cap refill Neurological: alert, oriented x 3, CN2-12 intact, strength normal upper extremities and lower extremities, sensation normal throughout, DTRs 2+ throughout, no cerebellar signs, gait normal, normal monofilament sensory exam of the feet Psychiatric: normal affect, behavior normal, pleasant Gynecological: Deferred to gynecology Breast: Deferred to gynecology     Assessment & Plan:     Encounter Diagnoses  Name Primary?  . General medical examination Yes  . Type II or unspecified type diabetes mellitus without mention of complication, not stated as uncontrolled   . Hyperlipidemia   . Tinea versicolor   . Tobacco use disorder    Gen. medical exam-discussed preventative care, exercise, diet, weight loss, vaccines, safety. Handout given on preventative care. At that she followup with her gynecologist regarding her for screening mammogram.  Diabetes-advised to check her glucose once in the morning fasting and bring those numbers to her next visit, advised to follow up with her eye doctor and dentist for routine exam and care, discussed diabetic diet, exercise , weight loss, daily foot checks, and regular followup for diabetes care.  Hyperlipidemia-continue efforts to eat healthy and lose weight. Labs today and we will call results.  Tinea versicolor-Nizoral shampoo sent. Call or return if not improving.  Tobacco use disorder-advise smoking cessation, discussed the risk  of tobacco use. She is not ready to quit at this time.  Cystocele and rectocele-followup with surgereon as planned.  Anxiety, depression-she is followed by a psychiatrist and counselor , and has regular followup.

## 2010-11-14 NOTE — Patient Instructions (Addendum)
DIABETES CARE:  Schedule an appointment with your eye doctor for yearly diabetic eye exam  Schedule an appointment soon with your dentist for routine dental care  Check your glucose just fasting in the mornings only; bring your glucose numbers in at your next appointment  Eat healthy diabetic diet  You need to exercise, preferably 30-40 minutes daily such as walking  Continue your efforts to lose weight  The only medication changes today was that I switched you to Metformin 1000mg  twice daily instead of Kombigyze.  Preventative Care for Adults - Female      MAINTAIN REGULAR HEALTH EXAMS:  A routine yearly physical is a good way to check in with your primary care provider about your health and preventive screening. It is also an opportunity to share updates about your health and any concerns you have, and receive a thorough all-over exam.   Most health insurance companies pay for at least some preventative services.  Check with your health plan for specific coverages.  WHAT PREVENTATIVE SERVICES DO WOMEN NEED?  Adult women should have their weight and blood pressure checked regularly.   Women age 51 and older should have their cholesterol levels checked regularly.  Women should be screened for cervical cancer with a Pap smear and pelvic exam beginning at either age 17, or 3 years after they become sexually activity.    Breast cancer screening generally begins at age 50 with a mammogram and breast exam by your primary care provider.    Beginning at age 71 and continuing to age 58, women should be screened for colorectal cancer.  Certain people may need continued testing until age 73.  Updating vaccinations is part of preventative care.  Vaccinations help protect against diseases such as the flu.  Osteoporosis is a disease in which the bones lose minerals and strength as we age. Women ages 69 and over should discuss this with their caregivers, as should women after menopause who  have other risk factors.  Lab tests are generally done as part of preventative care to screen for anemia and blood disorders, to screen for problems with the kidneys and liver, to screen for bladder problems, to check blood sugar, and to check your cholesterol level.  Preventative services generally include counseling about diet, exercise, avoiding tobacco, drugs, excessive alcohol consumption, and sexually transmitted infections.    GENERAL RECOMMENDATIONS FOR GOOD HEALTH:  Healthy diet:  Eat a variety of foods, including fruit, vegetables, animal or vegetable protein, such as meat, fish, chicken, and eggs, or beans, lentils, tofu, and grains, such as rice.  Drink plenty of water daily.  Decrease saturated fat in the diet, avoid lots of red meat, processed foods, sweets, fast foods, and fried foods.  Exercise:  Aerobic exercise helps maintain good heart health. At least 30-40 minutes of moderate-intensity exercise is recommended. For example, a brisk walk that increases your heart rate and breathing. This should be done on most days of the week.   Find a type of exercise or a variety of exercises that you enjoy so that it becomes a part of your daily life.  Examples are running, walking, swimming, water aerobics, and biking.  For motivation and support, explore group exercise such as aerobic class, spin class, Zumba, Yoga,or  martial arts, etc.    Set exercise goals for yourself, such as a certain weight goal, walk or run in a race such as a 5k walk/run.  Speak to your primary care provider about exercise goals.  Disease  prevention:  If you smoke or chew tobacco, find out from your caregiver how to quit. It can literally save your life, no matter how long you have been a tobacco user. If you do not use tobacco, never begin.   Maintain a healthy diet and normal weight. Increased weight leads to problems with blood pressure and diabetes.   The Body Mass Index or BMI is a way of measuring  how much of your body is fat. Having a BMI above 27 increases the risk of heart disease, diabetes, hypertension, stroke and other problems related to obesity. Your caregiver can help determine your BMI and based on it develop an exercise and dietary program to help you achieve or maintain this important measurement at a healthful level.  High blood pressure causes heart and blood vessel problems.  Persistent high blood pressure should be treated with medicine if weight loss and exercise do not work.   Fat and cholesterol leaves deposits in your arteries that can block them. This causes heart disease and vessel disease elsewhere in your body.  If your cholesterol is found to be high, or if you have heart disease or certain other medical conditions, then you may need to have your cholesterol monitored frequently and be treated with medication.   Ask if you should have a cardiac stress test if your history suggests this. A stress test is a test done on a treadmill that looks for heart disease. This test can find disease prior to there being a problem.  Menopause can be associated with physical symptoms and risks. Hormone replacement therapy is available to decrease these. You should talk to your caregiver about whether starting or continuing to take hormones is right for you.   Osteoporosis is a disease in which the bones lose minerals and strength as we age. This can result in serious bone fractures. Risk of osteoporosis can be identified using a bone density scan. Women ages 65 and over should discuss this with their caregivers, as should women after menopause who have other risk factors. Ask your caregiver whether you should be taking a calcium supplement and Vitamin D, to reduce the rate of osteoporosis.   Avoid drinking alcohol in excess (more than two drinks per day).  Avoid use of street drugs. Do not share needles with anyone. Ask for professional help if you need assistance or instructions on  stopping the use of alcohol, cigarettes, and/or drugs.  Brush your teeth twice a day with fluoride toothpaste, and floss once a day. Good oral hygiene prevents tooth decay and gum disease. The problems can be painful, unattractive, and can cause other health problems. Visit your dentist for a routine oral and dental check up and preventive care every 6-12 months.   Look at your skin regularly.  Use a mirror to look at your back. Notify your caregivers of changes in moles, especially if there are changes in shapes, colors, a size larger than a pencil eraser, an irregular border, or development of new moles.  Safety:  Use seatbelts 100% of the time, whether driving or as a passenger.  Use safety devices such as hearing protection if you work in environments with loud noise or significant background noise.  Use safety glasses when doing any work that could send debris in to the eyes.  Use a helmet if you ride a bike or motorcycle.  Use appropriate safety gear for contact sports.  Talk to your caregiver about gun safety.  Use sunscreen with a SPF (  or skin protection factor) of 15 or greater.  Lighter skinned people are at a greater risk of skin cancer. Don't forget to also wear sunglasses in order to protect your eyes from too much damaging sunlight. Damaging sunlight can accelerate cataract formation.   Practice safe sex. Use condoms. Condoms are used for birth control and to help reduce the spread of sexually transmitted infections (or STIs).  Some of the STIs are gonorrhea (the clap), chlamydia, syphilis, trichomonas, herpes, HPV (human papilloma virus) and HIV (human immunodeficiency virus) which causes AIDS. The herpes, HIV and HPV are viral illnesses that have no cure. These can result in disability, cancer and death.   Keep carbon monoxide and smoke detectors in your home functioning at all times. Change the batteries every 6 months or use a model that plugs into the wall.   Vaccinations:  Stay  up to date with your tetanus shots and other required immunizations. You should have a booster for tetanus every 10 years. Be sure to get your flu shot every year, since 5%-20% of the [place] population comes down with the flu. The flu vaccine changes each year, so being vaccinated once is not enough. Get your shot in the fall, before the flu season peaks.   Other vaccines to consider:  Human Papilloma Virus or HPV causes cancer of the cervix, and other infections that can be transmitted from person to person. There is a vaccine for HPV, and females should get immunized between the ages of 64 and 58. It requires a series of 3 shots.   Pneumococcal vaccine to protect against certain types of pneumonia.  This is normally recommended for adults age 36 or older.  However, adults younger than 41 years old with certain underlying conditions such as diabetes, heart or lung disease should also receive the vaccine.  Shingles vaccine to protect against Varicella Zoster if you are older than age 40, or younger than 40 years old with certain underlying illness.  Hepatitis A vaccine to protect against a form of infection of the liver by a virus acquired from food.  Hepatitis B vaccine to protect against a form of infection of the liver by a virus acquired from blood or body fluids, particularly if you work in health care.  If you plan to travel internationally, check with your local health department for specific vaccination recommendations.  Cancer Screening:  Breast cancer screening is essential to preventive care for women. All women age 70 and older should perform a breast self-exam every month. At age 39 and older, women should have their caregiver complete a breast exam each year. Women at ages 14 and older should have a mammogram (x-ray film) of the breasts. Your caregiver can discuss how often you need mammograms.    Cervical cancer screening includes taking a Pap smear (sample of cells examined under a  microscope) from the cervix (end of the uterus). It also includes testing for HPV (Human Papilloma Virus, which can cause cervical cancer). Screening and a pelvic exam should begin at age 68, or 3 years after a woman becomes sexually active. Screening should occur every year, with a Pap smear but no HPV testing, up to age 84. After age 30, you should have a Pap smear every 3 years with HPV testing, if no HPV was found previously.   Most routine colon cancer screening begins at the age of 37. On a yearly basis, doctors may provide special easy to use take-home tests to check for hidden blood  in the stool. Sigmoidoscopy or colonoscopy can detect the earliest forms of colon cancer and is life saving. These tests use a small camera at the end of a tube to directly examine the colon. Speak to your caregiver about this at age 68, when routine screening begins (and is repeated every 5 years unless early forms of pre-cancerous polyps or small growths are found).

## 2010-11-16 ENCOUNTER — Telehealth: Payer: Self-pay | Admitting: *Deleted

## 2010-11-16 NOTE — Telephone Encounter (Addendum)
Message copied by Ellsworth Lennox on Wed Nov 16, 2010  3:07 PM ------      Message from: Aleen Campi, DAVID      Created: Tue Nov 15, 2010  8:56 AM       Overall labs look good.  Liver, kidney, lytes, thyroid, urine all normal.  Her diabetes marker HgbA1C was 5.4% which is good, and bad cholesterol is 125.  At this point, lets just have her restart her Pravastatin 40mg  which was refilled yesterday, we switched to Metformin instead of Kombiglyze, and lets have her f/u in 57mo.  See if she picked up the generic Metformin, Pravastatin, and Nizoral shampoo (for rash)?   Patient informed of lab results.  Patient picked up all rx's and has started all medications. Scheduled to come in on 01-12-2011 at 9:30am for follow up.  CM, LPN

## 2010-12-01 ENCOUNTER — Ambulatory Visit (INDEPENDENT_AMBULATORY_CARE_PROVIDER_SITE_OTHER): Payer: BC Managed Care – PPO | Admitting: Medical

## 2010-12-01 ENCOUNTER — Encounter: Payer: Self-pay | Admitting: Medical

## 2010-12-01 VITALS — BP 96/62 | HR 88 | Temp 98.5°F | Wt 206.0 lb

## 2010-12-01 DIAGNOSIS — J069 Acute upper respiratory infection, unspecified: Secondary | ICD-10-CM

## 2010-12-01 MED ORDER — AZITHROMYCIN 500 MG PO TABS: 500.0000 mg | ORAL_TABLET | Freq: Every day | ORAL | Status: AC

## 2010-12-01 MED ORDER — BENZONATATE 200 MG PO CAPS: 200.0000 mg | ORAL_CAPSULE | Freq: Three times a day (TID) | ORAL | Status: AC | PRN

## 2010-12-01 NOTE — Patient Instructions (Signed)
Upper Respiratory Infection (URI), Adult An upper respiratory infection (URI) is also known as the common cold. It is often caused by a virus. Colds are easily spread (contagious). You can pass it to others by touch or by drinking out of the same glass. You may have:  A runny nose.  Sneezing.   Coughing.   A stuffy nose (nasal congestion).  A sinus infection.   A sore throat.  A scratchy voice (hoarseness).   You can also have:  Tiredness (fatigue).  Muscle aches.   A headache.  A mild fever.   Usually, you get well in a week or two. Your doctor will know if you have a URI by talking to you and examining you. HOME CARE  Inhale heated mist or steam (vaporizer or shower).   Sip chicken soup.   Get plenty of rest.   Use lozenges for throat comfort.   Rinse your mouth (gargle) with warm water or salt water (1/4 teaspoon salt in 8 ounces of water).   Only take medicine as told by your doctor.   Drink enough water and fluids to keep your pee (urine) clear or pale yellow.   Rest as needed.   Return to work when your temperature has returned to normal or as told by your doctor. Use a face mask and wash your hands to stop your cold from spreading.  GET HELP IF:  You have a temperature by mouth above 101.   After the first few days, you feel you are getting worse, not better.   You have questions about your medicine.  GET HELP RIGHT AWAY IF:  You have a temperature by mouth above 101, not controlled by medicine.   You have a bad or lasting headache, ear pain, sinus pain, or chest pain.   You have trouble breathing or get short of breath.   You have a lasting cough, cough up blood, or have a change in your usual mucus.   You have sore muscles, a stiff neck, or a very bad headache, not controlled with medicine.  MAKE SURE YOU:  Understand these instructions.   Will watch your condition.   Will get help right away if you are not doing well or get worse.  Document  Released: 11/29/2007 Document Re-Released: 09/06/2009 [employer] Patient Information 2011 [employer].  

## 2010-12-01 NOTE — Progress Notes (Signed)
Subjective:     [name] is a 40 y.o. female who presents for evaluation of symptoms of a URI. Symptoms include achiness, congestion, lightheadedness, productive cough with  yellow colored sputum, sneezing and sore throat, ear pressure, bad cough at night, fatigue.  Onset of symptoms was 2 days ago, and has been gradually worsening since that time. Treatment to date: Dayquil and Nyquil, ibuprofen for fever and aches. Denies sick contacts.  No other aggravating or relieving factors.  No other c/o.  The following portions of the patient's history were reviewed and updated as appropriate: allergies, current medications, past family history, past medical history, past social history, past surgical history and problem list.  Past Medical History  Diagnosis Date  . Obesity   . Dyslipidemia   . Diabetes mellitus   . Hyperlipidemia   . Obesity   . Tobacco use disorder   . Anxiety     Dr. Donnie Aho, [facility]  . Depression     Clinical psychologist, Erlinda Hong  . ADD (attention deficit disorder)   . Cystocele     sees gyn, Dr. Reinaldo Meeker  . Rectocele     Review of Systems Constitutional: denies chills, sweats, anorexia Skin: denies rash HEENT: denies itchy watery eyes Cardiovascular: denies chest pain Lungs: denies wheezing, SOB Abdomen: denies abdominal pain, nausea, vomiting, diarrhea GU: denies dysuria  Objective:   Filed Vitals:   12/01/10 1011  BP: 96/62  Pulse: 88  Temp: 98.5 F (36.9 C)    General appearance: Alert, WD/WN, no distress, mildly ill appearing                             Skin: warm, no rash                           Head: mild maxillary sinus tenderness                            Eyes: conjunctiva normal, corneas clear, PERRLA                            Ears: pearly TMs, external ear canals normal                          Nose: septum midline, turbinates swollen, with erythema and clear discharge             Mouth/throat: MMM,  tongue normal, mild pharyngeal erythema                           Neck: supple, no adenopathy, no thyromegaly, nontender                          Heart: RRR, normal S1, S2, no murmurs                         Lungs: somewhat bronchial sounds, otherwise, no wheezes, rales, or rhonchi     Assessment:   Encounter Diagnosis  Name Primary?  . URI (upper respiratory infection) Yes    Plan:   Discussed diagnosis and treatment of URI.  Suggested symptomatic OTC remedies.  Nasal saline spray for congestion.  Tylenol or Ibuprofen OTC for  fever and malaise.  Call/return in 2-3 days if symptoms aren't resolving. If worsening over the weekend, particularly fever and productive sputum, can begin Zpak.  tesslaon perles for cough.

## 2010-12-14 ENCOUNTER — Other Ambulatory Visit: Payer: Self-pay | Admitting: Obstetrics and Gynecology

## 2010-12-14 ENCOUNTER — Ambulatory Visit (HOSPITAL_COMMUNITY)
Admission: RE | Admit: 2010-12-14 | Discharge: 2010-12-14 | Disposition: A | Discharge status: Home / Self Care | Payer: BC Managed Care – PPO | Source: Ambulatory Visit | Attending: Obstetrics and Gynecology | Admitting: Obstetrics and Gynecology

## 2010-12-14 ENCOUNTER — Encounter (HOSPITAL_COMMUNITY): Payer: BC Managed Care – PPO

## 2010-12-14 ENCOUNTER — Other Ambulatory Visit: Payer: Self-pay | Admitting: Anesthesiology

## 2010-12-14 ENCOUNTER — Other Ambulatory Visit (HOSPITAL_COMMUNITY): Payer: Self-pay | Admitting: Obstetrics and Gynecology

## 2010-12-14 DIAGNOSIS — Z01811 Encounter for preprocedural respiratory examination: Secondary | ICD-10-CM | POA: Insufficient documentation

## 2010-12-14 DIAGNOSIS — N814 Uterovaginal prolapse, unspecified: Secondary | ICD-10-CM | POA: Insufficient documentation

## 2010-12-14 DIAGNOSIS — F17200 Nicotine dependence, unspecified, uncomplicated: Secondary | ICD-10-CM | POA: Insufficient documentation

## 2010-12-14 DIAGNOSIS — R059 Cough, unspecified: Secondary | ICD-10-CM

## 2010-12-14 DIAGNOSIS — R05 Cough: Secondary | ICD-10-CM

## 2010-12-14 DIAGNOSIS — Z01818 Encounter for other preprocedural examination: Secondary | ICD-10-CM | POA: Insufficient documentation

## 2010-12-14 DIAGNOSIS — Z01812 Encounter for preprocedural laboratory examination: Secondary | ICD-10-CM | POA: Insufficient documentation

## 2010-12-14 DIAGNOSIS — J40 Bronchitis, not specified as acute or chronic: Secondary | ICD-10-CM | POA: Insufficient documentation

## 2010-12-14 DIAGNOSIS — E119 Type 2 diabetes mellitus without complications: Secondary | ICD-10-CM | POA: Insufficient documentation

## 2010-12-14 LAB — CBC
HCT: 40.2 % (ref 36.0–46.0)
MCV: 90.7 fL (ref 78.0–100.0)
RBC: 4.43 MIL/uL (ref 3.87–5.11)
WBC: 7.4 10*3/uL (ref 4.0–10.5)

## 2010-12-14 LAB — DIFFERENTIAL
Basophils Absolute: 0.1 10*3/uL (ref 0.0–0.1)
Lymphocytes Relative: 34 % (ref 12–46)
Lymphs Abs: 2.5 10*3/uL (ref 0.7–4.0)
Neutro Abs: 3.9 10*3/uL (ref 1.7–7.7)
Neutrophils Relative %: 53 % (ref 43–77)

## 2010-12-14 LAB — COMPREHENSIVE METABOLIC PANEL
AST: 14 U/L (ref 0–37)
BUN: 12 mg/dL (ref 6–23)
CO2: 28 mEq/L (ref 19–32)
Calcium: 9.6 mg/dL (ref 8.4–10.5)
Chloride: 99 mEq/L (ref 96–112)
Creatinine, Ser: 0.61 mg/dL (ref 0.50–1.10)
GFR calc Af Amer: >60 mL/min (ref >60)
GFR calc non Af Amer: >60 mL/min (ref >60)
Glucose, Bld: 84 mg/dL (ref 70–99)
Total Bilirubin: 0.2 mg/dL — ABNORMAL LOW (ref 0.3–1.2)

## 2010-12-14 LAB — PREGNANCY, URINE: Preg Test, Ur: NEGATIVE

## 2010-12-14 LAB — SURGICAL PCR SCREEN
MRSA, PCR: NEGATIVE
Staphylococcus aureus: POSITIVE — AB

## 2010-12-14 LAB — TYPE AND SCREEN

## 2010-12-14 LAB — ABO/RH: ABO/RH(D): O NEG

## 2010-12-19 ENCOUNTER — Ambulatory Visit (HOSPITAL_COMMUNITY)
Admission: RE | Admit: 2010-12-19 | Discharge: 2010-12-20 | Disposition: A | Discharge status: Home / Self Care | Payer: BC Managed Care – PPO | Source: Ambulatory Visit | Attending: Obstetrics and Gynecology | Admitting: Obstetrics and Gynecology

## 2010-12-19 ENCOUNTER — Other Ambulatory Visit: Payer: Self-pay | Admitting: Obstetrics and Gynecology

## 2010-12-19 DIAGNOSIS — E119 Type 2 diabetes mellitus without complications: Secondary | ICD-10-CM | POA: Insufficient documentation

## 2010-12-19 DIAGNOSIS — F79 Unspecified intellectual disabilities: Secondary | ICD-10-CM | POA: Insufficient documentation

## 2010-12-19 DIAGNOSIS — F341 Dysthymic disorder: Secondary | ICD-10-CM | POA: Insufficient documentation

## 2010-12-19 DIAGNOSIS — N814 Uterovaginal prolapse, unspecified: Secondary | ICD-10-CM | POA: Insufficient documentation

## 2010-12-19 DIAGNOSIS — N393 Stress incontinence (female) (male): Secondary | ICD-10-CM | POA: Insufficient documentation

## 2010-12-19 DIAGNOSIS — E7800 Pure hypercholesterolemia, unspecified: Secondary | ICD-10-CM | POA: Insufficient documentation

## 2010-12-19 DIAGNOSIS — Z79899 Other long term (current) drug therapy: Secondary | ICD-10-CM | POA: Insufficient documentation

## 2010-12-19 LAB — CBC
HCT: 33.1 % — ABNORMAL LOW (ref 36.0–46.0)
Hemoglobin: 10.8 g/dL — ABNORMAL LOW (ref 12.0–15.0)
MCH: 29.9 pg (ref 26.0–34.0)
MCV: 91.7 fL (ref 78.0–100.0)
RBC: 3.61 MIL/uL — ABNORMAL LOW (ref 3.87–5.11)

## 2010-12-19 LAB — GLUCOSE, CAPILLARY: Glucose-Capillary: 123 mg/dL — ABNORMAL HIGH (ref 70–99)

## 2010-12-20 LAB — CBC
HCT: 29.8 % — ABNORMAL LOW (ref 36.0–46.0)
Hemoglobin: 9.9 g/dL — ABNORMAL LOW (ref 12.0–15.0)
MCHC: 32.8 g/dL (ref 30.0–36.0)
MCHC: 32.6 g/dL (ref 30.0–36.0)
RBC: 3.27 MIL/uL — ABNORMAL LOW (ref 3.87–5.11)
RDW: 12.6 % (ref 11.5–15.5)
WBC: 9.4 10*3/uL (ref 4.0–10.5)

## 2010-12-20 LAB — GLUCOSE, CAPILLARY
Glucose-Capillary: 114 mg/dL — ABNORMAL HIGH (ref 70–99)
Glucose-Capillary: 117 mg/dL — ABNORMAL HIGH (ref 70–99)

## 2010-12-21 NOTE — Discharge Summary (Signed)
[name], [name] NO.:  [id number]  MEDICAL RECORD NO.:  [id number]  LOCATION:  1524                         FACILITY:  [facility]  PHYSICIAN:  Malachi Pro. Ambrose Mantle, [name] DATE OF BIRTH:  01-27-1971  DATE OF ADMISSION:  12/19/2010 DATE OF DISCHARGE:  12/20/2010                              DISCHARGE SUMMARY   This is a 40 year old white female para 2-0-2 who was admitted to the hospital for vaginal hysterectomy, A and P repair because of a large rectocele, cervical prolapse, and smaller cystocele and a sling procedure for stress urinary incontinence.  The patient underwent the vaginal hysterectomy, A and P repair by Dr. Ambrose Mantle with Dr. Senaida Ores assisting and the sling procedure by Dr. Vernie Ammons.  During the surgery, I estimated the blood loss at about 600 cc.  Tissue seemed to be quite vascular.  Preoperatively, MRSA by PCR was negative, but her Staphylococcus aureus by PCR was positive and she was treated with a mupirocin ointment.  Postoperatively, the patient initially had a slightly low urine output, but this responded quite well.  She passed flatus freely.  She ambulated without difficulty.  After catheter was removed, she voided well.  She did not have much of an appetite, but she was able to drink liquids, did not feel like eating much.  On the evening of the first postop day, she was feeling well enough for discharge.  Her hemoglobin had been checked 3 times postoperatively. Her initial hemoglobin was 13.4, hematocrit 40.2, white count 7400, and platelet count 239,000 with a normal differential.  Her followup hemoglobin on the afternoon of surgery was 10.8, hematocrit 33.1, white count 12,200.  Subsequent hemoglobin on the morning of the first postop day was 9.9 and since there had been a considerable drop, I wanted to observe her for 8-10 hours and her hemoglobin at 4 p.m. on the first postop day was 9.7.  Her capillary blood glucoses were 123, 116,  144, 114, 117, and 110.  Comprehensive metabolic profile was completely normal.  Pregnancy test was negative.  Blood group and type was O negative.  The pack was removed on the first postop day and she is being discharged in improved condition.  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  OPERATION:  Vaginal hysterectomy, A and P repair, sling cystourethropexy.  MEDICATIONS AT DISCHARGE:  She is to continue her, 1. Probiotics. 2. Prilosec 20 mg daily. 3. Ibuprofen 600 mg twice daily as needed. 4. Clonazepam 0.5 mg one and half tablets in the morning, one half     tablet in the afternoon, half tablet in the evening. 5. Pravachol 40 mg daily. 6. Melatonin 6 mg daily. 7. Metformin 1000 mg twice daily. 8. Celexa 40 mg twice daily. 9. Vicodin 5/325, 30 tablets one every 4-6 hours as needed for pain. 10.Phenergan 25 mg 12 tablets one every 6-8 hours as needed for     nausea.  The patient is advised to return to the office in 1 week for followup examination.     Malachi Pro. Ambrose Mantle, [name]     TFH/MEDQ  D:  12/20/2010  T:  12/21/2010  Job:  [id number]  Electronically Signed by Tracey Harries [name] on 12/21/2010 12:03:30 PM

## 2010-12-21 NOTE — Op Note (Signed)
[name], [name] NO.:  [id number]  MEDICAL RECORD NO.:  [id number]  LOCATION:  DAYL                         FACILITY:  [facility]  PHYSICIAN:  Malachi Pro. Ambrose Mantle, [name] DATE OF BIRTH:  03-13-1971  DATE OF PROCEDURE:  12/19/2010 DATE OF DISCHARGE:                              OPERATIVE REPORT   PREOPERATIVE DIAGNOSIS:  Pelvic relaxation, cystocele, rectocele, cervical prolapse, stress urinary incontinence.  POSTOPERATIVE DIAGNOSIS:  Pelvic relaxation, cystocele, rectocele, cervical prolapse, stress urinary incontinence.  OPERATION:  Vaginal hysterectomy, A&P repair, sling cystourethropexy.  OPERATOR FOR THE VAGINAL HYSTERECTOMY A&P REPAIR:  Malachi Pro. Ambrose Mantle, [name]  Threasa HeadsSenaida Ores.  OPERATOR FOR THE SLING:  Mark C. Vernie Ammons, [name]  PROCEDURE IN DETAIL:  The patient was brought to the operating room, placed under satisfactory general anesthesia, placed in lithotomy position in the [place] stirrups.  The vulva, vagina, perineum and urethra were prepped with Betadine solution and draped as a sterile field.  A Foley catheter was inserted to straight drain.  Exam revealed a third to fourth degree rectocele, third-degree cervical prolapse, smaller cystocele.  The uterus felt to be upper limit of normal size.  The adnexa were free of masses.  A weighted speculum was used posteriorly. Cervix was grasped with Leahy clamps.  A dilute solution of Neo- Synephrine was injected at the cervicovaginal junction and a circumferential incision was made around the cervix at the cervicovaginal junction.  The bladder was pushed anteriorly.  The posterior cul-de-sac was entered with sharp dissection.  The uterosacral ligaments were clamped, cut and suture ligated and held.  The cardinal ligaments were clamped, cut and suture ligated.  The anterior peritoneum was searched for, I could not see it, so I made additional bites of the side of the uterus, clamping, cutting and suture  ligating with 0 Vicryl. I then entered the anterior peritoneum, retracted the bladder away, inverted the uterus through the incision in the cul-de-sac and with one clamp, was able to clamp across both upper pedicles.  The uterus was removed.  The upper pedicles were doubly suture ligated with 0 Vicryl. After this, there seemed to be some bleeding from the right upper pedicle.  I inspected it and with several interrupted figure-of-eight sutures of 0 Vicryl, felt I had the bleeding completely controlled.  In order not to mask any bleeding, I left the peritoneal cavity open.  I then ran the posterior vaginal cuff with a running locked suture of 0 Vicryl from uterosacral ligament to uterosacral ligament, reinspected the upper pedicles, found no significant bleeding, and I left the peritoneal cavity open.  I then dissected the anterior vaginal mucosa away from the smaller cystocele, developed the cystocele and imbricated a cystocele in the midline with interrupted sutures of 0 Vicryl.  I did cut away redundant vaginal mucosa but I did not close the entire anterior vaginal mucosa.  I began the closure at the cuff and proceeded anteriorly after I had placed a suture through the uterosacral ligaments above where they had been cut and obliterated the cul-de-sac with this sutured.  Then, I tied the initial uterosacral ligament sutures together in the midline, closed more vaginal mucosa and then turned  my attention to the rectocele.  I dissected the rectocele all the way to the vaginal cuff, cut away redundant mucosa, developed the rectocele, did a rectal exam, confirmed there was no rectal injury, imbricated the rectocele with multiple interrupted sutures of 0 Vicryl.  The vaginal mucosa was then closed in the midline with interrupted figure-of-eight sutures of 0 Vicryl and the perineum was sutured with 3-0 Vicryl.  At this point, Dr. Vernie Ammons came into the operating room and he suggested I go ahead  and close the entire vaginal mucosa because he would actually be working closer to the urethra than I had dissected the vaginal mucosa.  So, I closed the vaginal mucosa with interrupted figure-of-eight sutures of 0 Vicryl during the procedure.  Both ovaries were seen and were normal. She had a previous tubal ligation.  Sponge and needle counts were correct at this point.  Dr. Vernie Ammons did his procedure, came back into the operating room.  He had already closed his incision in the vaginal mucosa and I checked for hemostasis, placed one more figure-of-eight suture through the vaginal mucosa posteriorly where there seemed to be a little bit of bleeding and placed 2 inches iodoform backpack into the vagina.  The patient seemed to tolerate the procedure well.  Blood loss was estimated to be about 600 mL, the anesthetist felt it was more like 400 or 450.  Sponge and needle counts were correct and she was returned to recovery in satisfactory condition.     Malachi Pro. Ambrose Mantle, [name]     TFH/MEDQ  D:  12/19/2010  T:  12/19/2010  Job:  [id number]  cc:   Loraine Leriche C. Vernie Ammons, [name] Fax: [phone]  Electronically Signed by Tracey Harries [name] on 12/21/2010 12:03:23 PM

## 2010-12-21 NOTE — Op Note (Signed)
NAMEALTIE, [name] NO.:  [id number]  MEDICAL RECORD NO.:  [id number]  LOCATION:  DAYL                         FACILITY:  [facility]  PHYSICIAN:  [name], [name]  DATE OF BIRTH:  1971-04-12  DATE OF PROCEDURE:  12/19/2010 DATE OF DISCHARGE:                              OPERATIVE REPORT   PREOPERATIVE DIAGNOSIS:  Stress urinary incontinence.  POSTOPERATIVE DIAGNOSIS:  Stress urinary incontinence.  PROCEDURES: 1. Transobturator sling. 2. Cystoscopy.  SURGEON:  [name], [name]  ANESTHESIA:  General.  SPECIMENS:  None.  DRAINS:  A 16-French Foley catheter.  BLOOD LOSS:  Minimal.  COMPLICATIONS:  None.  INDICATIONS:  The patient is a 40 year old female who reported stress urinary incontinence.  In addition, she had pelvic prolapse and had elected to proceed with surgical correction of that.  She had experienced incontinence that occurred with laughing, coughing and horseback riding.  She said it improved slightly with weight loss but persisted and was evaluated with urodynamics that revealed pure stress urinary incontinence with no detrusor instability.  We discussed the treatment options and she has elected to proceed with mid urethral sling surgery.  The risks, complications and alternatives have been discussed.  DESCRIPTION OF OPERATION:  After informed consent, the patient was brought to the major OR, placed on table, administered general anesthesia and then moved to the dorsal lithotomy position.  Genitalia and vagina were sterilely prepped and draped and a 16-French Foley catheter was inserted in the bladder.  Dr. Ambrose Nelson then proceeded with his portion of the surgery which will be dictated separately by him.  I palpated the mid urethral region by placing a gentle traction on the Foley catheter and then injected 0.5% Marcaine with epinephrine in the subvaginal mucosa.  After allowing adequate time for epinephrine effect, a midline  incision was then made over the mid urethra and then using Metzenbaum scissors, I dissected laterally beneath the vaginal mucosa and around the urethra which was palpable with Foley catheter in place. This allowed in-situation of my index finger and I could palpate the undersurface of the symphysis pubis and its posterior aspect.  This was performed on the right and then left side identically.  I then palpated the location of the obturator fossa and at the level of the clitoris made a [name] in the medial aspect of the obturator fossa and injected local anesthetic in this location and then made stab incisions on each side.  The bladder was then fully drained with the catheter and I passed trocar through this the skin incision, through the obturator fossa and posterior behind the symphysis pubis and then directed this out at the mid urethral level, first on the left side and then right side.  The sling material was attached to the trocar and then this was withdrawn back through the skin incisions.  The catheter was removed and cystoscopy was performed using a 22-French cystoscope and a 70-degree lens.  The bladder was fully inspected and noted to be free of any tumor, stones or inflammatory lesions.  There was no evidence of injury or perforation.  There was no bleeding. Ureteral orifices were in normal configuration and position.  The cystoscope was removed and the Foley catheter was reinserted.  I then positioned the sling over the mid urethra and then removed the plastic sheathing over each side of the sling.  This allowed the sling to be positioned over the mid urethra with minimal tension.  I therefore irrigated with antibiotic solution and closed the skin incisions with Dermabond.  The vaginal incision was then closed with running 2-0 Vicryl suture and the catheter was left indwelling.  The patient tolerated the procedure well.  There were no intraoperative complications.  She  will remain in hospital overnight and undergo voiding trial in the morning.     [name] C. Vernie Nelson, [name]     MCO/MEDQ  D:  12/19/2010  T:  12/19/2010  Job:  [id number]  cc:   [name], [name] Fax: [phone]  Electronically Signed by [name] [name] on 12/21/2010 05:32:12 PM

## 2010-12-21 NOTE — H&P (Signed)
NAMEGRAYLYN, [name] NO.:  [id number]  MEDICAL RECORD NO.:  [id number]  LOCATION:  DAY                          FACILITY:  [facility]  PHYSICIAN:  Malachi Pro. Ambrose Mantle, [name] DATE OF BIRTH:  [birth year]  DATE OF ADMISSION:  12/09/2010 DATE OF DISCHARGE:                             HISTORY & PHYSICAL   HISTORY OF PRESENT ILLNESS:  This is a 40 year old white female para 2-0- 0-2, who is admitted for vaginal hysterectomy, A and P repair because of a large rectocele, uterine prolapse, and smaller cystocele as well as stress urinary incontinence.  This patient's last menstrual period was November 30, 2010 that lasted 5 days.  Her periods have severe cramps and moderate to heavy flow.  Previous period was 26 days prior lasting 5 days, same amount of flow and pain.  This patient has been noted to have a rectocele and prolapse of the cervix for over 3 years.  She came to the office one time complaining that there were whelps close to the opening to the vagina, which turned out to be the rectocele.  She also complains of stress urinary incontinence and she has been evaluated by Dr. Vernie Ammons and he felt that she has pure stress urinary incontinence and should have a mid urethral sling at the time of the hysterectomy if she chose to proceed with hysterectomy.  She is decided to proceed with the surgery and she is admitted now.  MEDICATIONS:  Celexa, metformin, Klonopin, pravastatin, and ibuprofen.  PAST MEDICAL HISTORY:  High cholesterol, diabetes, anxiety, depression, and recently diagnosed with mental retardation.  PAST SURGICAL HISTORY:  Cholecystectomy, tubal ligation.  ALLERGIES:  She is allergic to [facility].  Additional allergies will be listed on her chart at the hospital.  I do not have any additional ones at this point.  FAMILY HISTORY:  Mother with diabetes, high blood pressure, and high cholesterol.  Father with high cholesterol.  SOCIAL HISTORY:  The patient does  smoke 1-1/2 packs of cigarettes a day, but does not drink or take drugs.  PHYSICAL EXAM:  GENERAL:  A Well-developed, somewhat obese white female in no distress. VITAL SIGNS:  She is afebrile.  Blood pressure is 120/80, pulse is 80. HEAD, EYES, EARS, NOSE, AND THROAT:  No cranial abnormalities. Extraocular movements are intact.  Nose and pharynx clear. LUNGS:  Clear to auscultation. HEART:  Normal size and sounds.  No murmurs. ABDOMEN:  Soft and flat, nontender.  It is obese. GU:  Vulva and vagina are clean.  There is a third-degree rectocele and third-degree prolapse of the cervix.  There is a smaller cystocele.  The uterus is difficult to feel, but is thought to be midplane and normal size.  Adnexa are free of masses. RECTAL:  Exam confirms the rectocele.  ADMITTING IMPRESSION:  Pelvic relaxation with a third-degree rectocele, third-degree cervical prolapse, and smaller cystocele, stress urinary incontinence, mild mental retardation, diabetes, or hyperlipidemia.  The patient is admitted for vaginal hysterectomy, A and P repair and sling cyst urethropexy.  She has been informed of the risks of surgery including, but not limited to thromboembolic phenomenon, blood loss with need for reoperation and/or transfusion, fistula formation, nerve  injury, intestinal obstruction, failure of the procedure to correct her hernias or stress urinary incontinence, shortening and narrowing of the vagina.  The patient understands and wishes to proceed with the surgery.     Malachi Pro. Ambrose Mantle, [name]     TFH/MEDQ  D:  12/18/2010  T:  12/18/2010  Job:  [id number]  cc:   Veverly Fells. Vernie Ammons, [name] Fax: [phone]  Electronically Signed by Tracey Harries [name] on 12/21/2010 12:03:16 PM

## 2011-01-07 ENCOUNTER — Encounter: Payer: Self-pay | Admitting: Internal Medicine

## 2011-01-12 ENCOUNTER — Ambulatory Visit: Payer: BC Managed Care – PPO | Admitting: Internal Medicine

## 2011-01-26 ENCOUNTER — Telehealth: Payer: Self-pay | Admitting: *Deleted

## 2011-01-26 NOTE — Telephone Encounter (Signed)
I called pt back, and apparently she's had diarrhea for 6+wks since starting generic Metformin.  She also had surgery in the past month for cystocele.  She notes up to 6-7 loose stools daily for weeks.  Denies fever.  She was on antibiotics during the surgery period.  I reviewed her last labs and physical visit in May.  I advised she stop the Metformin for now, continue monitoring glucose and call if much different than normal, such as above 200+ fasting, hydrate well with water, G2 Gatorade, milk, etc., and call mid next wk for update.  If worse or not improving, may need stool studies.

## 2011-02-01 ENCOUNTER — Telehealth: Payer: Self-pay | Admitting: *Deleted

## 2011-02-01 NOTE — Telephone Encounter (Addendum)
Message copied by Dorthula Perfect on Wed Feb 01, 2011  8:26 AM ------      Message from: Aleen Campi, DAVID S      Created: Wed Feb 01, 2011  7:59 AM       Since her diabetic marker looked so good, just stop Metformin, c/t trying to eat healthy diabetic diet, exercise regularly, and lets recheck in 19mo.  Have her check fasting glucose daily.   As long as the morning fasting numbers are less than 130, then just f/u in 19mo.  Otherwise, if running higher than 130 regularly in the morning, let me know.  Have her check glucose in mornings only, 3-4 times per week, not necessarily every day.   Pt notified of above message.  Pt is already scheduled to come in at the end of the month.  Pt will write down morning glucose and bring with her at visit.  CM, LPN

## 2011-02-15 ENCOUNTER — Telehealth: Payer: Self-pay | Admitting: Medical

## 2011-02-15 NOTE — Telephone Encounter (Signed)
DONE

## 2011-02-20 ENCOUNTER — Ambulatory Visit (INDEPENDENT_AMBULATORY_CARE_PROVIDER_SITE_OTHER): Payer: BC Managed Care – PPO | Admitting: Medical

## 2011-02-20 ENCOUNTER — Encounter: Payer: Self-pay | Admitting: Medical

## 2011-02-20 VITALS — BP 110/78 | HR 64 | Temp 98.1°F | Resp 16 | Ht 66.0 in | Wt 199.0 lb

## 2011-02-20 DIAGNOSIS — E785 Hyperlipidemia, unspecified: Secondary | ICD-10-CM

## 2011-02-20 DIAGNOSIS — E119 Type 2 diabetes mellitus without complications: Secondary | ICD-10-CM

## 2011-02-20 DIAGNOSIS — R3129 Other microscopic hematuria: Secondary | ICD-10-CM

## 2011-02-20 DIAGNOSIS — Z23 Encounter for immunization: Secondary | ICD-10-CM

## 2011-02-20 DIAGNOSIS — K5900 Constipation, unspecified: Secondary | ICD-10-CM

## 2011-02-20 LAB — POCT URINALYSIS DIPSTICK
Bilirubin, UA: POSITIVE
Blood, UA: NEGATIVE
Ketones, UA: NEGATIVE
Nitrite, UA: NEGATIVE
pH, UA: 7

## 2011-02-20 LAB — POCT GLYCOSYLATED HEMOGLOBIN (HGB A1C): Hemoglobin A1C: 6

## 2011-02-20 NOTE — Progress Notes (Signed)
Subjective:   HPI [name] is a 40 y.o. female who presents for routine f/u on chronic issues.  Here for recheck on diabetes.  Since last visit had started back on Metformin, but this caused lots of diarrhea daily x over 32mo.  At this point she stopped Metformin 2-3 wks ago and diarrhea has completely resolved.  Fasting glucose has been running in 114-130 range routinely.  Denies hypoglycemia. She notes hx/o IBS, has seen Dr. Lavonia Drafts in the past.  Was initially put on regimen of probiotic and Miralax but hasn't been taking Miralax regularly.  She notes lately getting constipation, having BM about 3 x/ wk.  Denies blood in stool.  No other aggravating or relieving factors.  No other c/o.  Of note, since last visit had gynecological and urologic surgery for various pelvic abnormalities/weakness in pelvic wall.  The following portions of the patient's history were reviewed and updated as appropriate: allergies, current medications, past family history, past medical history, past social history, past surgical history and problem list.  Past Medical History  Diagnosis Date  . Obesity   . Dyslipidemia   . Diabetes mellitus   . Hyperlipidemia   . Obesity   . Tobacco use disorder   . Anxiety     Dr. Donnie Aho, [facility]  . Depression     Clinical psychologist, Erlinda Hong  . ADD (attention deficit disorder)   . Cystocele     sees gyn, Dr. Reinaldo Meeker  . Rectocele     Review of Systems Gen: No fever, chills, sweats, weight changes Skin: no foot lesions Lungs: No cough, shortness of breath Heart: No chest pain, palpitations, lower extremity edema Abdomen: No abdominal pain Neuro: no weakness, numbness GU: mild pressure with voiding. No burning, frequency, urgency     Objective:   Physical Exam  General appearance: alert, no distress, WD/WN, white female Oral: MMM, no lesions Lungs: CTA Heart: RRR, normal S1, S2 Extremities: no edema, cyanosis, clubbing Neuro: no  focal findings Abdomen: nontender, no masses, no organomegaly, non distended    Assessment :    Encounter Diagnoses  Name Primary?  . Hyperlipidemia Yes  . Type II or unspecified type diabetes mellitus without mention of complication, not stated as uncontrolled   . Constipation   . Microscopic hematuria   . Need for prophylactic vaccination and inoculation against influenza      Plan:     Hyperlipidemia - started on Pravastatin last visit.  Repeat labs today.  C/t low fat/low chol diet.  DM type II - due to diarrhea, she stopped Metformin and diarrhea resolved.  HgbA1C 6.0% today.  Advised diet control for now, c/t checking morning fasting glucose.  As long as she is running <130 will hold off on medication.  If worsening, will consider restarting Kombiglyze or other oral medication.  C/t diabetic diet, exercise regularly.  Constipation - begin Miralax daily.  C/t probiotic.  Call if not improving in 3-4 wks.  Microscopic hematuria - no hematuria on urinalysis today.  However, given other findings on UA, will send for culture.  Flu shot given today, VIS given. She notes hx/o pneumococcal vaccine in last 5 years or so.

## 2011-02-21 LAB — LIPID PANEL
HDL: 46 mg/dL (ref >39)
LDL Cholesterol: 64 mg/dL (ref 0–99)
Total CHOL/HDL Ratio: 2.8 Ratio
VLDL: 19 mg/dL (ref 0–40)

## 2011-02-21 LAB — ALT: ALT: 11 U/L (ref 0–35)

## 2011-02-21 NOTE — Progress Notes (Signed)
Pt notified of lab results.  Will call when urine culture results come in.  CM, LPN

## 2011-02-24 ENCOUNTER — Telehealth: Payer: Self-pay | Admitting: *Deleted

## 2011-02-24 ENCOUNTER — Other Ambulatory Visit: Payer: Self-pay | Admitting: Medical

## 2011-02-24 MED ORDER — SULFAMETHOXAZOLE-TRIMETHOPRIM 800-160 MG PO TABS: 1.0000 | ORAL_TABLET | Freq: Two times a day (BID) | ORAL | Status: AC

## 2011-02-24 NOTE — Telephone Encounter (Addendum)
Message copied by Dorthula Perfect on Fri Feb 24, 2011  5:24 PM ------      Message from: Jac Canavan      Created: Fri Feb 24, 2011  6:32 AM       Urine culture has multiple bacteria colonies and a decent amount of bacteria. Thus, call out Macrobid 100mg  BID x 7 days, #14, no refill.  Have her repeat UA and possibly culture in 2wk.  Recheck OV with me in 57mo.   Pt informed Septra Ds was sent to pharmacy.  Will return in 2 weeks for a repeat UA.  CM, LPN

## 2011-03-01 ENCOUNTER — Telehealth: Payer: Self-pay | Admitting: Family Medicine

## 2011-03-01 NOTE — Telephone Encounter (Signed)
Lets hold off on any further antibiotic for now.

## 2011-03-01 NOTE — Telephone Encounter (Signed)
Called pt and pt does not have any symptoms.  Pt did state that she has taken Cipro in the past.  The  Bactrim is making her anxiety worse.  What would you like to do?  CM, LPN

## 2011-03-01 NOTE — Telephone Encounter (Signed)
How many days has she taken the Bactrim?  Is she still having any urinary symptoms such as burning, pain, frequency, urgency?    Lets confirm, she can't take Macrobid or Bactrim at this point correct?  Has she been on Cipro in the past without problems?

## 2011-03-01 NOTE — Telephone Encounter (Signed)
Pt informed to hold on antibiotic for now.  CM,LPN

## 2011-03-01 NOTE — Telephone Encounter (Signed)
Pt called and said Bactrim is making her anxiety 3 times worse.  Please call [phone]

## 2011-03-07 ENCOUNTER — Other Ambulatory Visit: Payer: Self-pay | Admitting: *Deleted

## 2011-03-07 ENCOUNTER — Telehealth: Payer: Self-pay | Admitting: Medical

## 2011-03-07 ENCOUNTER — Other Ambulatory Visit: Payer: Self-pay | Admitting: Medical

## 2011-03-07 ENCOUNTER — Encounter: Payer: Self-pay | Admitting: Medical

## 2011-03-07 DIAGNOSIS — N390 Urinary tract infection, site not specified: Secondary | ICD-10-CM

## 2011-03-07 MED ORDER — SULFAMETHOXAZOLE-TRIMETHOPRIM 800-160 MG PO TABS: 1.0000 | ORAL_TABLET | Freq: Two times a day (BID) | ORAL | Status: AC

## 2011-03-07 MED ORDER — CIPROFLOXACIN HCL 500 MG PO TABS: 500.0000 mg | ORAL_TABLET | Freq: Two times a day (BID) | ORAL | Status: AC

## 2011-03-07 NOTE — Telephone Encounter (Signed)
Please call, UTI back.  Had to stop previous rx due to reaction  [facility]  In [place]

## 2011-03-07 NOTE — Telephone Encounter (Signed)
Pt notified of prescription sent to pharmacy and to hydrate well.  Advised pt to drink cranberry juice for the next 2-3 days and to call if not resolving in 3-4 days.  CM, LPN

## 2011-03-07 NOTE — Telephone Encounter (Signed)
i sent Bactrim antibiotic to pharmacy.  Hydrate well, drink some cranberry juice daily next 2-3 days.  Call if not resolving in 3-4 days.

## 2011-03-07 NOTE — Telephone Encounter (Signed)
Called in Cipro 500 mg 1 po BID x 5 days #10 with 0 refills to [facility] at [phone] and pt is aware.  CM, LPN

## 2011-03-24 ENCOUNTER — Encounter: Payer: BC Managed Care – PPO | Admitting: Internal Medicine

## 2011-03-24 ENCOUNTER — Encounter: Payer: Self-pay | Admitting: Internal Medicine

## 2011-03-30 ENCOUNTER — Ambulatory Visit: Payer: BC Managed Care – PPO | Admitting: Medical

## 2011-05-12 ENCOUNTER — Ambulatory Visit (INDEPENDENT_AMBULATORY_CARE_PROVIDER_SITE_OTHER): Payer: Self-pay | Admitting: Family Medicine

## 2011-05-12 ENCOUNTER — Encounter: Payer: Self-pay | Admitting: Family Medicine

## 2011-05-12 DIAGNOSIS — F410 Panic disorder [episodic paroxysmal anxiety] without agoraphobia: Secondary | ICD-10-CM

## 2011-05-12 DIAGNOSIS — R300 Dysuria: Secondary | ICD-10-CM

## 2011-05-12 DIAGNOSIS — E119 Type 2 diabetes mellitus without complications: Secondary | ICD-10-CM

## 2011-05-12 DIAGNOSIS — F17200 Nicotine dependence, unspecified, uncomplicated: Secondary | ICD-10-CM

## 2011-05-12 DIAGNOSIS — N390 Urinary tract infection, site not specified: Secondary | ICD-10-CM | POA: Insufficient documentation

## 2011-05-12 LAB — POCT UA - MICROSCOPIC ONLY

## 2011-05-12 LAB — POCT URINALYSIS DIPSTICK
Bilirubin, UA: NEGATIVE
Glucose, UA: NEGATIVE
Ketones, UA: NEGATIVE
Spec Grav, UA: 1.015

## 2011-05-12 MED ORDER — CEPHALEXIN 500 MG PO CAPS: 500.0000 mg | ORAL_CAPSULE | Freq: Two times a day (BID) | ORAL | Status: AC

## 2011-05-12 NOTE — Assessment & Plan Note (Signed)
Intermittent urinary symptoms since last June (bladder sling/partial hysterectomy). Today urine positive for UTI Plan: U culture. Keflex 500 BID for 10 days.Increase liquid intake. Awaiting for UCx results.

## 2011-05-12 NOTE — Assessment & Plan Note (Signed)
Normal A1C. Non pharmacologic intervention since last August.  Plan: Close monitoring of CBG's. A1C every 3 months or sooner if warrant. If glycemia abnormal. Consider glyburide as alternative or start with low dose metformin if necessary to minimize GI symptoms.

## 2011-05-12 NOTE — Progress Notes (Signed)
  Subjective:    Patient ID: [name], female    DOB: [birth year], 40 y.o.   MRN: [id number]  HPI Pt that comes today as a new patient to establish care in our practice. She has hx of psychiatric disorders for which she has been followed by Psychology and Psychiatry specialists. She has also hx of DM and hyperlipidemia. Today her complaints are urinary symptoms that she has been having on and off for about every month since last June when she had  bladder sling and  partial hysterectomy surgeries. This symptoms are burning with urination and frequency; also occasional nausea with no vomiting. She has not had fevers, chills, abdominal or flank pain. DM: she was taking Metformin in the past and that was stopped by her prior PCP due to diarrhea. A1C last August was 6.0. Today her A1C still 6.0. No pharmacologic treatment for DM since August, no symptoms of hypoglycemia, no more diarrhea. Pt is doing exercise and states she has intentionally lost weight. Psychiatric disorders: Pt with hx of anxiety, depressive mood and panic attacks on celexa and clonazepam, also on psychotherapy treatment. She mentions she was on 3 mg clonazepam daily and she has been weaning down with specialist supervision to 1.5 mg daily, dose she is currently taking. She still has quite frequent episodes of panic attacks. She has no suicidal ideations or plans. Pt is a smoker and this relieves her anxiety, has showed interest in smoking cessation but has no immediate plans to quit  Review of Systems Per HPI    Objective:   Physical Exam  Constitutional: She is oriented to person, place, and time. No distress.  HENT:  Head: Normocephalic and atraumatic.  Right Ear: External ear normal.  Left Ear: External ear normal.  Mouth/Throat: Oropharynx is clear and moist. No oropharyngeal exudate.  Eyes: Conjunctivae and EOM are normal. Pupils are equal, round, and reactive to light.       Normal funduscopy bilaterally. No  hemorrhages or exudates.  Neck: Neck supple. No thyromegaly present.  Cardiovascular: Normal rate, regular rhythm and normal heart sounds.   No murmur heard. Pulmonary/Chest: Breath sounds normal. No respiratory distress. She has no wheezes. She has no rales.  Abdominal: Soft. Bowel sounds are normal. She exhibits no distension. There is no tenderness.  Genitourinary:       No CVA tenderness.   Musculoskeletal: Normal range of motion. She exhibits no edema.  Lymphadenopathy:    She has no cervical adenopathy.  Neurological: She is alert and oriented to person, place, and time.  Psychiatric: She has a normal mood and affect. Her behavior is normal. Judgment and thought content normal.          Assessment & Plan:

## 2011-05-12 NOTE — Patient Instructions (Addendum)
It has been a pleasure to see you today. Please take the medications as prescribed. I will call you if the urine culture comes back abnormal otherwise you will receive a letter Make your next appointment in 3 months to f/u your chronic medical problems or sooner if your symptoms don't improve with this treatment.    Urinary Tract Infection Infections of the urinary tract can start in several places. A bladder infection (cystitis), a kidney infection (pyelonephritis). They usually get better if treated with medicines (antibiotics) that kill germs. Take all the medicine until it is gone.  You may feel better in a few days, but TAKE ALL MEDICINE or the infection may not respond and may become more difficult to treat. HOME CARE INSTRUCTIONS   Drink enough water and fluids to keep the urine clear or pale yellow. Cranberry juice is especially recommended, in addition to large amounts of water.   Avoid caffeine, tea, and carbonated beverages. They tend to irritate the bladder.   Alcohol may irritate the prostate.   Only take over-the-counter or prescription medicines for pain, discomfort, or fever as directed by your caregiver.  To prevent further infections:  Empty the bladder often. Avoid holding urine for long periods of time.   After a bowel movement, women should cleanse from front to back. Use each tissue only once.   Empty the bladder before and after sexual intercourse.  FINDING OUT THE RESULTS OF YOUR TEST Not all test results are available during your visit. It is important for you to follow up on all test results. SEEK MEDICAL CARE IF:   There is back pain. SEEK IMMEDIATE MEDICAL CARE IF:   There is severe back pain or lower abdominal pain.   You develop chills.   You have a fever.   There is nausea or vomiting.   There is continued burning or discomfort with urination.  MAKE SURE YOU:   Understand these instructions.   Will watch your condition.   Will get help  right away if you are not doing well or get worse.  Document Released: 03/22/2005 Document Revised: 02/22/2011 Document Reviewed: 10/25/2006 [facility] Patient Information 2012 [place], [place].

## 2011-05-12 NOTE — Assessment & Plan Note (Signed)
She has them about 3 times per week. (she has been with this problem since she is 40 y/o). On Clonazepam,  Celexa max dose,  psychotherapy, and under psychiatry f/u. Plan: Continue with same treatment

## 2011-05-12 NOTE — Assessment & Plan Note (Addendum)
No intention of quitting at this time, but showed interest. Plan: We counseled about smoking cessation. We will address again on her next visit.

## 2011-05-17 ENCOUNTER — Telehealth: Payer: Self-pay | Admitting: Family Medicine

## 2011-05-17 MED ORDER — FLUCONAZOLE 150 MG PO TABS: 150.0000 mg | ORAL_TABLET | Freq: Once | ORAL | Status: AC

## 2011-05-17 NOTE — Telephone Encounter (Signed)
Was given abx last Friday and now has a yeast inf.  Wants to know if something can be called in. [facility]- [place], [place]

## 2011-05-17 NOTE — Telephone Encounter (Signed)
States she has vaginal itching, no discharge. She is not pregnant . Will send message to Dr. Leveda Anna to send in RX . [facility] , [place]. She has not finished antibiotic yet .

## 2011-05-17 NOTE — Telephone Encounter (Signed)
Rx sent 

## 2011-06-07 ENCOUNTER — Ambulatory Visit: Payer: Self-pay | Admitting: Family Medicine

## 2011-07-26 ENCOUNTER — Ambulatory Visit (INDEPENDENT_AMBULATORY_CARE_PROVIDER_SITE_OTHER): Payer: Self-pay | Admitting: Family Medicine

## 2011-07-26 VITALS — BP 111/73 | HR 72 | Wt 201.0 lb

## 2011-07-26 DIAGNOSIS — R350 Frequency of micturition: Secondary | ICD-10-CM

## 2011-07-26 DIAGNOSIS — E119 Type 2 diabetes mellitus without complications: Secondary | ICD-10-CM

## 2011-07-26 DIAGNOSIS — R300 Dysuria: Secondary | ICD-10-CM

## 2011-07-26 LAB — POCT URINALYSIS DIPSTICK
Leukocytes, UA: NEGATIVE
Urobilinogen, UA: 1
pH, UA: 7.5

## 2011-07-26 MED ORDER — IBUPROFEN 600 MG PO TABS: 600.0000 mg | ORAL_TABLET | Freq: Three times a day (TID) | ORAL | Status: DC | PRN

## 2011-07-26 NOTE — Assessment & Plan Note (Signed)
Urinary frequency and pelvic pressure most likely due to underlying pelvic weakness and history of surgically repaired cystocele,rectocele.   Will send urine for culture for reassurance.  Rxed ibuprofen.   Advised working on regular soft BM's to prevent undue pressure on bladder from constipation.  Reassured she does not have signs of mesh infection.  Will follow-up with PCP for symptoms after regulating BM's.  Unable to see urologist or pelvic PT due to uninsured.  May benefit from empiric treatment for overactive bladder.

## 2011-07-26 NOTE — Patient Instructions (Signed)
Aim for a bowel movement every other day Miralax daily, or colace (stool softener) Add fiber to diet Will send you an rx

## 2011-07-26 NOTE — Progress Notes (Signed)
  Subjective:    Patient ID: [name], female    DOB: 09-17-1970, 41 y.o.   MRN: [id number]  HPI 1 week of urinary frequency  Had hysterectomy and bladder sling in June 2012 for rectocele and cystocele with urologist.    Since the surgery has had intermittent urinary frequency, pinching feeling near urethra.  Also has low back pain and abdominal pain, intermittent, no aggravating or alleviating factors.  Blood sugars have been fasting unde 130's.  Has history of chronic constipation.  BM's twice per week  No vaginal discharge.  Very concerned for possible infection, possible "mesh failure" she read about.  States she is uninsured and unable to return to see urologist. Review of Systems     Objective:   Physical Exam GEN: Alert & Oriented, No acute distress, here with her mom for support. CV:  Regular Rate & Rhythm, no murmur Respiratory:  Normal work of breathing, CTAB Abd:  + BS, soft, no tenderness to palpation.  Mildly tender in lower pelvic area         Assessment & Plan:

## 2011-07-26 NOTE — Assessment & Plan Note (Signed)
Has proteinuria today.  Would advise recheck at next appointment and consider low dose ACE-I given DM for renal protection

## 2011-07-27 LAB — URINE CULTURE: Colony Count: 30000

## 2011-07-28 ENCOUNTER — Telehealth: Payer: Self-pay | Admitting: Family Medicine

## 2011-07-28 NOTE — Telephone Encounter (Signed)
Pt is asking for results of labs °

## 2011-08-03 ENCOUNTER — Telehealth: Payer: Self-pay | Admitting: *Deleted

## 2011-08-03 NOTE — Telephone Encounter (Signed)
Called to inform pt that she Has proteinuria. Will need to check urine again at next office visit.Laureen Ochs, Viann Shove

## 2011-08-07 ENCOUNTER — Telehealth: Payer: Self-pay | Admitting: Family Medicine

## 2011-08-07 ENCOUNTER — Ambulatory Visit (INDEPENDENT_AMBULATORY_CARE_PROVIDER_SITE_OTHER): Payer: Self-pay | Admitting: Family Medicine

## 2011-08-07 ENCOUNTER — Encounter: Payer: Self-pay | Admitting: Family Medicine

## 2011-08-07 VITALS — BP 109/73 | HR 76 | Temp 98.1°F | Ht 66.0 in | Wt 202.8 lb

## 2011-08-07 DIAGNOSIS — J209 Acute bronchitis, unspecified: Secondary | ICD-10-CM | POA: Insufficient documentation

## 2011-08-07 MED ORDER — ALBUTEROL SULFATE HFA 108 (90 BASE) MCG/ACT IN AERS: 2.0000 | INHALATION_SPRAY | RESPIRATORY_TRACT | Status: DC | PRN

## 2011-08-07 MED ORDER — AZITHROMYCIN 250 MG PO TABS: ORAL_TABLET | ORAL | Status: AC

## 2011-08-07 NOTE — Progress Notes (Signed)
  Subjective:     [name] is a 41 y.o. female here for evaluation of a cough. Onset of symptoms was 4 days ago. Symptoms have been gradually worsening since that time. The cough is productive of green sputum and is aggravated by nothing. Associated symptoms include: sputum production, wheezing and cough, congestion. Patient does not have a history of asthma. Patient does not have a history of environmental allergens. Patient has not traveled recently. Patient does have a history of smoking. Patient has not had a previous chest x-ray. Patient has not had a PPD done. She is actively smoking despite current symptoms.   Review of Systems Pertinent items are noted in HPI. No fever, chills, night sweats, weight loss.   Objective:   Filed Vitals:   08/07/11 1341  BP: 109/73  Pulse: 76  Temp: 98.1 F (36.7 C)  TempSrc: Oral  Height: 5\' 6"  (1.676 m)  Weight: 202 lb 12.8 oz (91.989 kg)  Lungs: wheezing in both lower lung fields bilaterally. No dullness to percussion, no egophony. Heart - Regular rate and rhythm.  No murmurs, gallops or rubs.    Neck:  No deformities, thyromegaly, masses, or tenderness noted.   Supple with full range of motion without pain. Mouth - no lesions, mucous membranes are moist, no decaying teeth  Nose:  External nasal examination shows no deformity or inflammation. Nasal mucosa are pink and moist without lesions or exudates. No septal dislocation or dislocation.No obstruction to airflow. Ears:  External ear exam shows no significant lesions or deformities.  Otoscopic examination reveals clear canals, tympanic membranes are intact bilaterally without bulging, retraction, inflammation or discharge. Hearing is grossly normal bilaterall   Assessment:

## 2011-08-07 NOTE — Telephone Encounter (Signed)
Pt was here 2 weeks ago and wants to know if she can have abx called in instead of coming in today.  Already has appt at 1:30

## 2011-08-07 NOTE — Telephone Encounter (Signed)
Returned call to patient.  States she has sinus pressure and green phlegm.  Wants to know if abx can be called in without office visit.  Informed patient that she will require appt to be evaluated to see if abx is needed.  Patient already has appt scheduled today at 1:30pm with Dr. Mikel Cella and will keep this appt.  Gaylene Brooks, RN

## 2011-08-07 NOTE — Assessment & Plan Note (Signed)
Advised to quit smoking and gave her the resources she needs to do this. Advised nicotine gum and quitline. Albuterol. Azithromycin for three days.

## 2011-08-07 NOTE — Patient Instructions (Signed)
It was great to see you today!  Schedule an appointment to see me as needed.  To quit smoking I suggest you call 1 800 quit now Please buy the 4mg  gum and chew only half every time you have a craving.   I have prescribed you antibiotics for 3 days and an albuterol inhaler.

## 2011-08-14 ENCOUNTER — Ambulatory Visit (INDEPENDENT_AMBULATORY_CARE_PROVIDER_SITE_OTHER): Payer: Self-pay | Admitting: Family Medicine

## 2011-08-14 ENCOUNTER — Encounter: Payer: Self-pay | Admitting: Family Medicine

## 2011-08-14 VITALS — BP 90/60 | HR 70 | Temp 98.8°F | Ht 67.0 in | Wt 202.0 lb

## 2011-08-14 DIAGNOSIS — E119 Type 2 diabetes mellitus without complications: Secondary | ICD-10-CM

## 2011-08-14 DIAGNOSIS — E7800 Pure hypercholesterolemia, unspecified: Secondary | ICD-10-CM

## 2011-08-14 DIAGNOSIS — F17200 Nicotine dependence, unspecified, uncomplicated: Secondary | ICD-10-CM

## 2011-08-14 DIAGNOSIS — F410 Panic disorder [episodic paroxysmal anxiety] without agoraphobia: Secondary | ICD-10-CM

## 2011-08-14 LAB — POCT GLYCOSYLATED HEMOGLOBIN (HGB A1C): Hemoglobin A1C: 6.1

## 2011-08-14 NOTE — Patient Instructions (Addendum)
It has been a pleasure to see you today. Please take the medications as prescribed. Make your next appointment in 3-4 months ro sooner if needed. Make a separate appointment for laboratory (lipid profile) Smoking cessation consult to help you maintain this achievement!

## 2011-08-15 ENCOUNTER — Telehealth: Payer: Self-pay | Admitting: Family Medicine

## 2011-08-15 NOTE — Telephone Encounter (Signed)
Spoke with patient about this issue, she says she has been waking up more frequently and is more irritable. I explained to her that stopping smoking may do this to her

## 2011-08-15 NOTE — Telephone Encounter (Signed)
Patient is calling because she forgot to ask a question yesterday.  She is wondering if stopping smoking could cause her panic attacks to get worse.

## 2011-08-19 NOTE — Progress Notes (Signed)
  Subjective:    Patient ID: [name], female    DOB: [birth year], 41 y.o.   MRN: [id number]  HPI Pt that came to the office today to f/o her chronic conditions of DM, HLP. She also mention since Friday she stopped smoking and is planing to quit. She has had a supply of Nicotine gum that she is ussing and has found effective. Also she called the 1800-quit-now line and will be receiving shortly the supply of nic- gum for 2 weeks or two months if she qualifies. She is very possitive in changes life style changes. #1 DM.  Controlling only with diet. No symptoms of polyuria, polyphagia or polydipsia. No visual difficulties, no change of sensation on extremities. No complaints. #2 HLP. On Pravastatin, no taking it consistently as prescribed. No muscular pain, no other side effects.  #3 Recurrent UTI s/p hysterectomy and  bladder sling surgery: No symptoms at this time, no other complaints. #4 Panic attacks:on Clonopin now on 1mg  daily. (reduced from 2mg ) with good symptom control.  #5 Bronchitis resolved.  Review of Systems  Constitutional: Negative for fatigue.  HENT: Negative.   Eyes: Negative.   Respiratory: Negative for cough, chest tightness, shortness of breath and wheezing.   Cardiovascular: Negative for chest pain, palpitations and leg swelling.  Genitourinary: Negative for dysuria, flank pain and difficulty urinating.  Musculoskeletal: Negative.   Neurological: Negative for dizziness, weakness, numbness and headaches.  Psychiatric/Behavioral: Negative for behavioral problems, confusion and agitation.       Objective:   Physical Exam  Constitutional: She is oriented to person, place, and time. No distress.  HENT:  Mouth/Throat: Oropharynx is clear and moist.  Eyes: Conjunctivae and EOM are normal. Pupils are equal, round, and reactive to light.  Neck: Neck supple.  Cardiovascular: Normal rate, regular rhythm, normal heart sounds and intact distal pulses.  Exam reveals no gallop  and no friction rub.   No murmur heard. Pulmonary/Chest: Effort normal and breath sounds normal. No respiratory distress. She has no wheezes. She has no rales.  Abdominal: Soft. Bowel sounds are normal. She exhibits no distension and no mass. There is no tenderness.  Musculoskeletal: Normal range of motion. She exhibits no edema.  Neurological: She is alert and oriented to person, place, and time. She has normal reflexes.       No neurologic focalization.          Assessment & Plan:

## 2011-08-21 NOTE — Assessment & Plan Note (Signed)
Stop smoking since last Friday Feb/15th 2013. Has joined 1800 quick now. Pt enthusiastic.  Plan: Discussed benefits and gave positive reinforcement during visit.

## 2011-08-21 NOTE — Assessment & Plan Note (Signed)
On Clonopin. Dose being reduced. Pt tolerating well.  Plan:  Continue Psychiatry f/u.

## 2011-08-21 NOTE — Assessment & Plan Note (Signed)
A1C 6.1 prior 6.0. Pt is not taking medication for this. Pt controlled on diet. Plan: Continue with diet and exercise.  Recheck A1C in 3 months if still on target we continue present management if not will consider restart oral DM therapy.

## 2011-08-21 NOTE — Assessment & Plan Note (Addendum)
Last check was 6 month ago. WNL. Pt taking inconsistently Pravastatin. Plan: Recheck in 3 months.

## 2011-10-14 ENCOUNTER — Encounter (HOSPITAL_COMMUNITY): Payer: Self-pay | Admitting: *Deleted

## 2011-10-14 ENCOUNTER — Emergency Department (INDEPENDENT_AMBULATORY_CARE_PROVIDER_SITE_OTHER)
Admission: EM | Admit: 2011-10-14 | Discharge: 2011-10-14 | Disposition: A | Discharge status: Home / Self Care | Payer: Self-pay | Source: Home / Self Care | Attending: Family Medicine | Admitting: Family Medicine

## 2011-10-14 DIAGNOSIS — K5900 Constipation, unspecified: Secondary | ICD-10-CM

## 2011-10-14 DIAGNOSIS — K5909 Other constipation: Secondary | ICD-10-CM

## 2011-10-14 DIAGNOSIS — J309 Allergic rhinitis, unspecified: Secondary | ICD-10-CM

## 2011-10-14 DIAGNOSIS — J302 Other seasonal allergic rhinitis: Secondary | ICD-10-CM

## 2011-10-14 LAB — POCT URINALYSIS DIP (DEVICE)
Glucose, UA: NEGATIVE mg/dL
Hgb urine dipstick: NEGATIVE
Nitrite: NEGATIVE
Specific Gravity, Urine: 1.01 (ref 1.005–1.030)
Urobilinogen, UA: 0.2 mg/dL (ref 0.0–1.0)
pH: 7 (ref 5.0–8.0)

## 2011-10-14 MED ORDER — FLUTICASONE PROPIONATE 50 MCG/ACT NA SUSP: 1.0000 | Freq: Two times a day (BID) | NASAL | Status: DC

## 2011-10-14 MED ORDER — CETIRIZINE HCL 10 MG PO TABS: 10.0000 mg | ORAL_TABLET | Freq: Every day | ORAL | Status: DC

## 2011-10-14 NOTE — ED Provider Notes (Signed)
History     CSN: [id number]  Arrival date & time 10/14/11  1130   First MD Initiated Contact with Patient 10/14/11 1152      Chief Complaint  Patient presents with  . Sore Throat  . Abdominal Pain  . Back Pain  . Otalgia    (Consider location/radiation/quality/duration/timing/severity/associated sxs/prior treatment) Patient is a 41 y.o. female presenting with pharyngitis, abdominal pain, back pain, and ear pain. The history is provided by the patient and a parent.  Sore Throat This is a new problem. The current episode started yesterday. The problem has not changed since onset.Associated symptoms include abdominal pain. The symptoms are aggravated by swallowing.  Abdominal Pain The primary symptoms of the illness include abdominal pain. The primary symptoms of the illness do not include nausea, vomiting or diarrhea. The current episode started more than 2 days ago. The onset of the illness was gradual. The problem has not changed since onset. The patient has had a change in bowel habit. Additional symptoms associated with the illness include constipation and back pain.  Back Pain  Associated symptoms include abdominal pain.  Otalgia Associated symptoms include rhinorrhea, sore throat and abdominal pain. Pertinent negatives include no diarrhea and no vomiting.    Past Medical History  Diagnosis Date  . Obesity   . Dyslipidemia   . Diabetes mellitus   . Hyperlipidemia   . Obesity   . Tobacco use disorder   . Anxiety     Dr. Donnie Aho, [facility]  . Depression     Clinical psychologist, Erlinda Hong  . ADD (attention deficit disorder)   . Cystocele     sees gyn, Dr. Reinaldo Meeker  . Rectocele     Past Surgical History  Procedure Date  . Tubal ligation   . Cholecystectomy   . Tubal ligation   . Diagnostic mammogram     never; sees Dr. Reinaldo Meeker  . Abdominal hysterectomy   . Incontinence surgery June 2012    Family History  Problem Relation Age of Onset    . Depression Mother   . Hyperlipidemia Mother   . Hypertension Mother   . Diabetes Mother   . Hyperlipidemia Father   . Depression Father   . Nephrolithiasis Brother     History  Substance Use Topics  . Smoking status: Former Smoker -- 1.0 packs/day for 7 years    Quit date: 09/13/2011  . Smokeless tobacco: Former Neurosurgeon    Quit date: 08/11/2011  . Alcohol Use: No    OB History    Grav Para Term Preterm Abortions TAB SAB Ect Mult Living                  Review of Systems  Constitutional: Negative.   HENT: Positive for ear pain, congestion, sore throat, rhinorrhea and postnasal drip.   Gastrointestinal: Positive for abdominal pain and constipation. Negative for nausea, vomiting and diarrhea.  Musculoskeletal: Positive for back pain.    Allergies  Bactrim; Codeine; Macrobid; and Nitrofurantoin  Home Medications   Current Outpatient Rx  Name Route Sig Dispense Refill  . CITALOPRAM HYDROBROMIDE 40 MG PO TABS Oral Take 40 mg by mouth daily.      Marland Kitchen CLONAZEPAM 2 MG PO TABS Oral Take 1.5 mg by mouth daily.     Marland Kitchen PRAVASTATIN SODIUM 40 MG PO TABS Oral Take 1 tablet (40 mg total) by mouth daily. 30 tablet 5  . RANITIDINE HCL 300 MG PO TABS Oral Take 300 mg by mouth at  bedtime.    . ALBUTEROL SULFATE HFA 108 (90 BASE) MCG/ACT IN AERS Inhalation Inhale 2 puffs into the lungs every 4 (four) hours as needed for wheezing. 1 Inhaler 0  . CETIRIZINE HCL 10 MG PO TABS Oral Take 1 tablet (10 mg total) by mouth daily. One tab daily for allergies 30 tablet 1  . VITAMIN D-3 PO Oral Take 1 tablet by mouth daily.      Marland Kitchen FLUTICASONE PROPIONATE 50 MCG/ACT NA SUSP Nasal Place 1 spray into the nose 2 (two) times daily. 1 g 2  . IBUPROFEN 600 MG PO TABS Oral Take 1 tablet (600 mg total) by mouth every 8 (eight) hours as needed for pain. 30 tablet 0  . MELATONIN 3 MG PO CAPS Oral Take by mouth 1 dose over 24 hours.      Marland Kitchen PREDNISONE 50 MG PO TABS  1 tab daily for 2 days, then 1/2 tab daily for 2  days. 3 tablet 1  . SOLUBLE FIBER/PROBIOTICS PO Oral Take 1 tablet by mouth daily.        BP 111/73  Pulse 86  Temp(Src) 98.6 F (37 C) (Oral)  Resp 16  SpO2 99%  LMP 11/12/2010  Physical Exam  Nursing note and vitals reviewed. Constitutional: She appears well-developed and well-nourished.  HENT:  Head: Normocephalic.  Right Ear: External ear normal.  Left Ear: External ear normal.  Nose: Mucosal edema and rhinorrhea present.  Mouth/Throat: Oropharynx is clear and moist.  Abdominal: Soft. Bowel sounds are normal. She exhibits no distension and no mass. There is no hepatosplenomegaly. There is tenderness in the right upper quadrant and left upper quadrant. There is no rebound, no guarding and no CVA tenderness.  Skin: Skin is warm and dry.    ED Course  Procedures (including critical care time)   Labs Reviewed  POCT URINALYSIS DIP (DEVICE)  LAB REPORT - SCANNED   No results found.   1. Seasonal allergic rhinitis   2. Constipation, chronic       MDM          Linna Hoff, MD 10/18/11 2047

## 2011-10-14 NOTE — ED Notes (Signed)
Pt with onset of back pain and abdominal pain x 1.5 months - sore throat onset last night - c/o bilateral ear pain

## 2011-10-17 ENCOUNTER — Emergency Department (INDEPENDENT_AMBULATORY_CARE_PROVIDER_SITE_OTHER)
Admission: EM | Admit: 2011-10-17 | Discharge: 2011-10-17 | Disposition: A | Discharge status: Home / Self Care | Payer: Self-pay | Source: Home / Self Care | Attending: Family Medicine | Admitting: Family Medicine

## 2011-10-17 ENCOUNTER — Encounter (HOSPITAL_COMMUNITY): Payer: Self-pay | Admitting: *Deleted

## 2011-10-17 DIAGNOSIS — H6990 Unspecified Eustachian tube disorder, unspecified ear: Secondary | ICD-10-CM

## 2011-10-17 DIAGNOSIS — H6980 Other specified disorders of Eustachian tube, unspecified ear: Secondary | ICD-10-CM

## 2011-10-17 MED ORDER — TRIAMCINOLONE ACETONIDE 40 MG/ML IJ SUSP
40.0000 mg | Freq: Once | INTRAMUSCULAR | Status: AC
  Administered 2011-10-17: 40 mg via INTRAMUSCULAR

## 2011-10-17 MED ORDER — TRIAMCINOLONE ACETONIDE 40 MG/ML IJ SUSP
INTRAMUSCULAR | Status: AC
  Filled 2011-10-17: qty 5

## 2011-10-17 MED ORDER — PREDNISONE 50 MG PO TABS: ORAL_TABLET | ORAL | Status: AC

## 2011-10-17 NOTE — ED Provider Notes (Signed)
History     CSN: [id number]  Arrival date & time 10/17/11  1191   First MD Initiated Contact with Patient 10/17/11 [phone]      Chief Complaint  Patient presents with  . Otalgia    (Consider location/radiation/quality/duration/timing/severity/associated sxs/prior treatment) Patient is a 41 y.o. female presenting with plugged ear sensation. The history is provided by the patient.  Ear Fullness This is a new problem. The current episode started more than 2 days ago. The problem has not changed since onset.   Past Medical History  Diagnosis Date  . Obesity   . Dyslipidemia   . Diabetes mellitus   . Hyperlipidemia   . Obesity   . Tobacco use disorder   . Anxiety     Dr. Donnie Aho, [facility]  . Depression     Clinical psychologist, Erlinda Hong  . ADD (attention deficit disorder)   . Cystocele     sees gyn, Dr. Reinaldo Meeker  . Rectocele     Past Surgical History  Procedure Date  . Tubal ligation   . Cholecystectomy   . Tubal ligation   . Diagnostic mammogram     never; sees Dr. Reinaldo Meeker  . Abdominal hysterectomy   . Incontinence surgery June 2012    Family History  Problem Relation Age of Onset  . Depression Mother   . Hyperlipidemia Mother   . Hypertension Mother   . Diabetes Mother   . Hyperlipidemia Father   . Depression Father   . Nephrolithiasis Brother     History  Substance Use Topics  . Smoking status: Former Smoker -- 1.0 packs/day for 7 years    Quit date: 09/13/2011  . Smokeless tobacco: Former Neurosurgeon    Quit date: 08/11/2011  . Alcohol Use: No    OB History    Grav Para Term Preterm Abortions TAB SAB Ect Mult Living                  Review of Systems  Constitutional: Negative.   HENT: Positive for ear pain, congestion and postnasal drip.     Allergies  Bactrim; Codeine; Macrobid; and Nitrofurantoin  Home Medications   Current Outpatient Rx  Name Route Sig Dispense Refill  . CETIRIZINE HCL 10 MG PO TABS Oral Take 1  tablet (10 mg total) by mouth daily. One tab daily for allergies 30 tablet 1  . VITAMIN D-3 PO Oral Take 1 tablet by mouth daily.      Marland Kitchen CITALOPRAM HYDROBROMIDE 40 MG PO TABS Oral Take 40 mg by mouth daily.      Marland Kitchen CLONAZEPAM 2 MG PO TABS Oral Take 1.5 mg by mouth daily.     Marland Kitchen FLUTICASONE PROPIONATE 50 MCG/ACT NA SUSP Nasal Place 1 spray into the nose 2 (two) times daily. 1 g 2  . PRAVASTATIN SODIUM 40 MG PO TABS Oral Take 1 tablet (40 mg total) by mouth daily. 30 tablet 5  . SOLUBLE FIBER/PROBIOTICS PO Oral Take 1 tablet by mouth daily.      Marland Kitchen RANITIDINE HCL 300 MG PO TABS Oral Take 300 mg by mouth at bedtime.    . ALBUTEROL SULFATE HFA 108 (90 BASE) MCG/ACT IN AERS Inhalation Inhale 2 puffs into the lungs every 4 (four) hours as needed for wheezing. 1 Inhaler 0  . IBUPROFEN 600 MG PO TABS Oral Take 1 tablet (600 mg total) by mouth every 8 (eight) hours as needed for pain. 30 tablet 0  . MELATONIN 3 MG PO CAPS Oral  Take by mouth 1 dose over 24 hours.      Marland Kitchen PREDNISONE 50 MG PO TABS  1 tab daily for 2 days, then 1/2 tab daily for 2 days. 3 tablet 1    BP 113/71  Pulse 70  Temp(Src) 98.6 F (37 C) (Oral)  Resp 16  SpO2 98%  LMP 11/12/2010  Physical Exam  Nursing note and vitals reviewed. Constitutional: She appears well-developed and well-nourished.  HENT:  Head: Normocephalic.  Right Ear: External ear normal. Tympanic membrane is retracted. Tympanic membrane is not injected and not erythematous.  Left Ear: External ear normal. Tympanic membrane is retracted. Tympanic membrane is not injected and not erythematous.  Nose: Nose normal.  Mouth/Throat: Oropharynx is clear and moist.    ED Course  Procedures (including critical care time)  Labs Reviewed - No data to display No results found.   1. Eustachian tube dysfunction       MDM          Linna Hoff, MD 10/18/11 2102

## 2011-10-17 NOTE — ED Notes (Signed)
Pt reports URI the past 5 days.  Today she c/o bilateral ear pressure and pain, sinus congestion and productive cough.   Denies fever

## 2011-10-17 NOTE — Discharge Instructions (Signed)
Use medicine as prescribed, return as needed. Continue your other medicines.

## 2011-11-17 ENCOUNTER — Encounter: Payer: Self-pay | Admitting: Family Medicine

## 2011-11-17 ENCOUNTER — Ambulatory Visit (INDEPENDENT_AMBULATORY_CARE_PROVIDER_SITE_OTHER): Payer: Medicare Other | Admitting: Family Medicine

## 2011-11-17 VITALS — BP 122/78 | HR 76 | Ht 65.0 in | Wt 211.0 lb

## 2011-11-17 DIAGNOSIS — S93409A Sprain of unspecified ligament of unspecified ankle, initial encounter: Secondary | ICD-10-CM

## 2011-11-17 DIAGNOSIS — S93401A Sprain of unspecified ligament of right ankle, initial encounter: Secondary | ICD-10-CM

## 2011-11-17 DIAGNOSIS — J309 Allergic rhinitis, unspecified: Secondary | ICD-10-CM

## 2011-11-17 NOTE — Progress Notes (Signed)
Chief Complaint  Patient presents with  . twisted right ankle    twisted right ankle yesterday around 3. brusied.   HPI:  R leg gave way after she came down the stairs, at bottom step. Twisted ankle.  She was able to bear weight on it the rest of the day. Didn't swell immediately.  Put icy hot on it (didn't have access to ice), and taking ibuprofen for the pain.  Took 2 pills every 6 hours. Last night, it looked bruised on the bottom of the foot. Has pain at lateral ankle and entire side of foot.  Hurts worse when barefoot, feels better when wearing a shoe. She woke up this morning, noticed more swelling, more bruising.  Pain is about the same--still hurts to walk on it, but able to bear weight.   Feeling tired today, more than usual.  She has been taking Zyrtec (for sneezing) along with her benadryl and klonopin, and wondering if that could be why.  Past Medical History  Diagnosis Date  . Obesity   . Dyslipidemia   . Diabetes mellitus   . Hyperlipidemia   . Obesity   . Current smoker   . Anxiety     Dr. Donnie Aho, [facility]  . Depression     Clinical psychologist, Erlinda Hong  . ADD (attention deficit disorder)   . Cystocele     sees gyn, Dr. Reinaldo Meeker  . Rectocele    History   Social History  . Marital Status: Married    Spouse Name: N/A    Number of Children: 2  . Years of Education: N/A   Occupational History  . Not on file.   Social History Main Topics  . Smoking status: Former Smoker -- 1.0 packs/day for 7 years    Quit date: 09/13/2011  . Smokeless tobacco: Former Neurosurgeon    Quit date: 08/11/2011  . Alcohol Use: No  . Drug Use: No  . Sexually Active: Yes    Birth Control/ Protection: Surgical     trying to get disability; lives with husband; has children, but mother has custody   Other Topics Concern  . Not on file   Social History Narrative  . No narrative on file   Current Outpatient Prescriptions on File Prior to Visit  Medication Sig  Dispense Refill  . cetirizine (ZYRTEC) 10 MG tablet Take 1 tablet (10 mg total) by mouth daily. One tab daily for allergies  30 tablet  1  . citalopram (CELEXA) 40 MG tablet Take 40 mg by mouth daily.        . clonazePAM (KLONOPIN) 2 MG tablet Take 1.5 mg by mouth daily.       . fluticasone (FLONASE) 50 MCG/ACT nasal spray Place 1 spray into the nose 2 (two) times daily.  1 g  2  . pravastatin (PRAVACHOL) 40 MG tablet Take 1 tablet (40 mg total) by mouth daily.  30 tablet  5  . Probiotic Product (SOLUBLE FIBER/PROBIOTICS PO) Take 1 tablet by mouth daily.        Marland Kitchen albuterol (PROVENTIL HFA;VENTOLIN HFA) 108 (90 BASE) MCG/ACT inhaler Inhale 2 puffs into the lungs every 4 (four) hours as needed for wheezing.  1 Inhaler  0  . Cholecalciferol (VITAMIN D-3 PO) Take 1 tablet by mouth daily.        Marland Kitchen ibuprofen (ADVIL,MOTRIN) 600 MG tablet Take 1 tablet (600 mg total) by mouth every 8 (eight) hours as needed for pain.  30 tablet  0  .  Melatonin 3 MG CAPS Take by mouth 1 dose over 24 hours.        . ranitidine (ZANTAC) 300 MG tablet Take 300 mg by mouth at bedtime.       Allergies  Allergen Reactions  . Bactrim   . Codeine     vomiting  . Macrobid (Nitrofurantoin Monohydrate Macrocrystals)   . Nitrofurantoin    ROS:  Denies easy bleeding, bruising, fevers.  +whole leg pain.  +anxiety.  + sneezing  PHYSICAL EXAM: BP 122/78  Pulse 76  Ht 5\' 5"  (1.651 m)  Wt 211 lb (95.709 kg)  BMI 35.11 kg/m2  LMP 11/12/2010 Well developed, obese female, in no distress. Asking many questions, anxious R ankle--full range of motion. Some pain with ankle eversion. Tender at ATFL.  No bony tenderness.  Slight soft tissue swelling and hyperpigmentation vs bruise at same area as swelling.  Negative drawer test. Gait--walking normally down the hall.  ASSESSMENT/PLAN: 1. Ankle sprain, right, initial encounter   2. Allergic rhinitis, cause unspecified     ACE wrap--shown how to put on properly, and given one. Ice,  elevation  F/u if persists/worsens   Allergies--try changing to allegra or claritin (less sedating), or it may be having allergies itself that is causing you to feel tired (and not from the medications)

## 2011-11-17 NOTE — Patient Instructions (Signed)
Ankle Sprain An ankle sprain is an injury to the strong, fibrous tissues (ligaments) that hold the bones of your ankle joint together.  CAUSES Ankle sprain usually is caused by a fall or by twisting your ankle. People who participate in sports are more prone to these types of injuries.  SYMPTOMS  Symptoms of ankle sprain include:  Pain in your ankle. The pain may be present at rest or only when you are trying to stand or walk.   Swelling.   Bruising. Bruising may develop immediately or within 1 to 2 days after your injury.   Difficulty standing or walking.  DIAGNOSIS  Your caregiver will ask you details about your injury and perform a physical exam of your ankle to determine if you have an ankle sprain. During the physical exam, your caregiver will press and squeeze specific areas of your foot and ankle. Your caregiver will try to move your ankle in certain ways. An X-ray exam may be done to be sure a bone was not broken or a ligament did not separate from one of the bones in your ankle (avulsion).  TREATMENT  Certain types of braces can help stabilize your ankle. Your caregiver can make a recommendation for this. Your caregiver may recommend the use of medication for pain. If your sprain is severe, your caregiver may refer you to a surgeon who helps to restore function to parts of your skeletal system (orthopedist) or a physical therapist. HOME CARE INSTRUCTIONS  Apply ice to your injury for 1 to 2 days or as directed by your caregiver. Applying ice helps to reduce inflammation and pain.  Put ice in a plastic bag.   Place a towel between your skin and the bag.   Leave the ice on for 15 to 20 minutes at a time, every 2 hours while you are awake.   Take over-the-counter or prescription medicines for pain, discomfort, or fever only as directed by your caregiver.   Keep your injured leg elevated, when possible, to lessen swelling.   If your caregiver recommends crutches, use them as  instructed. Gradually, put weight on the affected ankle. Continue to use crutches or a cane until you can walk without feeling pain in your ankle.   If you have a plaster splint, wear the splint as directed by your caregiver. Do not rest it on anything harder than a pillow the first 24 hours. Do not put weight on it. Do not get it wet. You may take it off to take a shower or bath.   You may have been given an elastic bandage to wear around your ankle to provide support. If the elastic bandage is too tight (you have numbness or tingling in your foot or your foot becomes cold and blue), adjust the bandage to make it comfortable.   If you have an air splint, you may blow more air into it or let air out to make it more comfortable. You may take your splint off at night and before taking a shower or bath.   Wiggle your toes in the splint several times per day if you are able.  SEEK MEDICAL CARE IF:   You have an increase in bruising, swelling, or pain.   Your toes feel cold.   Pain relief is not achieved with medication.  SEEK IMMEDIATE MEDICAL CARE IF: Your toes are numb or blue or you have severe pain. MAKE SURE YOU:   Understand these instructions.   Will watch your condition.     Will get help right away if you are not doing well or get worse.  Document Released: 06/12/2005 Document Revised: 06/01/2011 Document Reviewed: 01/15/2008 [facility] Patient Information 2012 [place], [place].   Allergies--try changing to allegra or claritin (less sedating), or it may be having allergies itself that is causing you to feel tired (and not from the medications)  Ice, elevate, ibuprofen for pain  Follow up if ankle pain persists/worsens

## 2011-11-27 ENCOUNTER — Encounter: Payer: Self-pay | Admitting: Family Medicine

## 2011-11-27 ENCOUNTER — Ambulatory Visit (INDEPENDENT_AMBULATORY_CARE_PROVIDER_SITE_OTHER): Payer: Medicare Other | Admitting: Family Medicine

## 2011-11-27 VITALS — BP 112/70 | HR 68 | Ht 66.0 in | Wt 213.6 lb

## 2011-11-27 DIAGNOSIS — E119 Type 2 diabetes mellitus without complications: Secondary | ICD-10-CM

## 2011-11-27 DIAGNOSIS — T148 Other injury of unspecified body region: Secondary | ICD-10-CM

## 2011-11-27 DIAGNOSIS — E7800 Pure hypercholesterolemia, unspecified: Secondary | ICD-10-CM

## 2011-11-27 DIAGNOSIS — T148XXA Other injury of unspecified body region, initial encounter: Secondary | ICD-10-CM

## 2011-11-27 DIAGNOSIS — W57XXXA Bitten or stung by nonvenomous insect and other nonvenomous arthropods, initial encounter: Secondary | ICD-10-CM

## 2011-11-27 DIAGNOSIS — F17200 Nicotine dependence, unspecified, uncomplicated: Secondary | ICD-10-CM

## 2011-11-27 LAB — BASIC METABOLIC PANEL
BUN: 16 mg/dL (ref 6–23)
Chloride: 101 mEq/L (ref 96–112)
Creat: 0.63 mg/dL (ref 0.50–1.10)
Glucose, Bld: 101 mg/dL — ABNORMAL HIGH (ref 70–99)
Potassium: 4.2 mEq/L (ref 3.5–5.3)

## 2011-11-27 MED ORDER — MOMETASONE FUROATE 0.1 % EX CREA: TOPICAL_CREAM | Freq: Every day | CUTANEOUS | Status: DC

## 2011-11-27 NOTE — Progress Notes (Signed)
  Subjective:    Patient ID: [name], female    DOB: [birth year], 41 y.o.   MRN: [id number]  HPI Pt that comes today complaining of insect bites on her upper and lower extremities. She recently moved to a new house and has been outside more lately taking care of her dog. The skin lesions are multiple, small, very pruriginous and present only on exposed areas of upper and lower extremities. There is no fever, headaches, no joint or palms/soles involvement. Pt has also DM and HLP and also comes for f/u. DM: no symptoms of hyper or hypoglycemia. No vision changes. Pt is not currently on pharmacologic treatment. She continues control with diet and exercise regimen. She is concerned that since she has quit smoking has increased about 10 lbs.  HLP: inconsistent with Pravachol. No side effect reported. No chest pain or palpitations.    Review of Systems  Constitutional: Negative for fever, chills, activity change, appetite change and fatigue.  HENT: Negative.   Eyes: Negative.   Respiratory: Negative.   Cardiovascular: Negative.   Gastrointestinal: Negative.   Musculoskeletal: Negative.   Skin: Positive for rash.       Skin rash consistent with insect bites. Most likely mosquitoes due to distribution.  Neurological: Negative.        Objective:   Physical Exam  Constitutional: She is oriented to person, place, and time. No distress.  HENT:  Mouth/Throat: Oropharynx is clear and moist.  Eyes: Conjunctivae and EOM are normal. Pupils are equal, round, and reactive to light.  Neck: Normal range of motion. Neck supple.  Cardiovascular: Normal rate, regular rhythm and normal heart sounds.   Pulmonary/Chest: Effort normal and breath sounds normal.  Abdominal: Soft. Bowel sounds are normal.  Musculoskeletal: Normal range of motion. She exhibits no edema and no tenderness.  Neurological: She is alert and oriented to person, place, and time. She has normal reflexes.  Skin:       Multiple  papular lesions of about 0.2-0.5 cm diameter, with central excoriation due to intense pruritus. Different stages of lesions. Only present on areas of exposure of upper and lower extremities. No a single lesion on back, abdomen or proximal UE - LE.          Assessment & Plan:

## 2011-11-27 NOTE — Patient Instructions (Addendum)
It has been a pleasure to see you today Use OFF when going outdors Apply cream on legs and arms twice a day. Take zyrtec 10 mg daily. I will call you with the labs results if they come back abnormal otherwise you will receive a letter. Make a f/u appointment in 3-4 months.

## 2011-11-28 ENCOUNTER — Telehealth: Payer: Self-pay | Admitting: Family Medicine

## 2011-11-28 LAB — LIPID PANEL
Cholesterol: 177 mg/dL (ref 0–200)
Total CHOL/HDL Ratio: 3.1 Ratio
VLDL: 14 mg/dL (ref 0–40)

## 2011-11-28 NOTE — Assessment & Plan Note (Signed)
Last A1C was 6.1, today 5.5. Continue improving Plan: Continue diet and exercise. Recheck in 3-4 months

## 2011-11-28 NOTE — Assessment & Plan Note (Signed)
Last Lipid profile 9 months ago (WNL) Pt on Pravastatin inconsistently.  Plan: Lipid profile ordered.

## 2011-11-28 NOTE — Telephone Encounter (Signed)
Wants to know results of her labs 

## 2011-11-28 NOTE — Assessment & Plan Note (Signed)
Present on upper and lower extremities, only on exposed areas. No fever, no generalized symptoms. No other complaints. Plan: Mometasone cream on lesions. Use of OFF while outdoors as a preventive. Zyrtec 10 mg daily for pruritus. Red flags of worsening condition discussed. Pt voices understanding. F/u as needed.

## 2011-11-28 NOTE — Assessment & Plan Note (Addendum)
Pt has quit smoking since Feb/2013. Concerned of 10 lbs weight gain. Plan: Positive reinforcement with smoking cessation achievements. For weight gain we talk as this can be a side effect of quitting smoking but managing diet and exercise can help with this. Pt positive in continuing abstinent from smoking.

## 2011-11-29 NOTE — Telephone Encounter (Signed)
Called pt with results. Pt voiced understanding. The skin lesions have improved with treatment.

## 2011-11-30 ENCOUNTER — Other Ambulatory Visit: Payer: Self-pay | Admitting: Medical

## 2011-12-06 ENCOUNTER — Ambulatory Visit (INDEPENDENT_AMBULATORY_CARE_PROVIDER_SITE_OTHER): Payer: Medicare Other | Admitting: Family

## 2011-12-06 ENCOUNTER — Encounter: Payer: Self-pay | Admitting: Family

## 2011-12-06 VITALS — BP 110/70 | Ht 64.5 in | Wt 213.0 lb

## 2011-12-06 DIAGNOSIS — F341 Dysthymic disorder: Secondary | ICD-10-CM

## 2011-12-06 DIAGNOSIS — F481 Depersonalization-derealization syndrome: Secondary | ICD-10-CM

## 2011-12-06 DIAGNOSIS — J302 Other seasonal allergic rhinitis: Secondary | ICD-10-CM

## 2011-12-06 DIAGNOSIS — F419 Anxiety disorder, unspecified: Secondary | ICD-10-CM

## 2011-12-06 DIAGNOSIS — J309 Allergic rhinitis, unspecified: Secondary | ICD-10-CM

## 2011-12-06 DIAGNOSIS — F32A Depression, unspecified: Secondary | ICD-10-CM

## 2011-12-06 DIAGNOSIS — E119 Type 2 diabetes mellitus without complications: Secondary | ICD-10-CM

## 2011-12-06 DIAGNOSIS — I10 Essential (primary) hypertension: Secondary | ICD-10-CM

## 2011-12-06 DIAGNOSIS — F329 Major depressive disorder, single episode, unspecified: Secondary | ICD-10-CM

## 2011-12-06 NOTE — Progress Notes (Signed)
Subjective:    Patient ID: [name], female    DOB: Oct 04, 1970, 41 y.o.   MRN: [id number]  HPI 41 year old white female, one cigarette per day smoker, new patient to the practice is in to be established. She has a history of type 2 diabetes, depression, anxiety, GERD,   Derealization disorder. she currently sees psychiatry and is stable on her medications. Fasting blood sugars are typically in the 120s. Last A1c was 5.5. She's not currently exercising, but this time a followup diet. She has a family history of type 2 diabetes. In June 2012, she had a hysterectomy.    Review of Systems  Constitutional: Negative.   HENT: Negative.   Eyes: Negative.   Respiratory: Negative.   Cardiovascular: Negative.   Gastrointestinal: Negative.   Genitourinary: Negative.   Musculoskeletal: Negative.   Skin: Negative.   Neurological: Negative.   Hematological: Negative.   Psychiatric/Behavioral: Negative.    Past Medical History  Diagnosis Date  . Obesity   . Dyslipidemia   . Diabetes mellitus   . Hyperlipidemia   . Obesity   . Tobacco use disorder   . Anxiety     Dr. Donnie Aho, [facility]  . Depression     Clinical psychologist, Erlinda Hong  . ADD (attention deficit disorder)   . Cystocele     sees gyn, Dr. Reinaldo Meeker  . Rectocele     History   Social History  . Marital Status: Married    Spouse Name: N/A    Number of Children: 2  . Years of Education: N/A   Occupational History  . Not on file.   Social History Main Topics  . Smoking status: Former Smoker -- 1.0 packs/day for 7 years    Quit date: 09/13/2011  . Smokeless tobacco: Former Neurosurgeon    Quit date: 08/11/2011  . Alcohol Use: No  . Drug Use: No  . Sexually Active: Yes    Birth Control/ Protection: Surgical     trying to get disability; lives with husband; has children, but mother has custody   Other Topics Concern  . Not on file   Social History Narrative  . No narrative on file    Past  Surgical History  Procedure Date  . Tubal ligation   . Cholecystectomy   . Diagnostic mammogram     never; sees Dr. Reinaldo Meeker  . Abdominal hysterectomy   . Incontinence surgery June 2012    Family History  Problem Relation Age of Onset  . Depression Mother   . Hyperlipidemia Mother   . Hypertension Mother   . Diabetes Mother   . Hyperlipidemia Father   . Depression Father   . Nephrolithiasis Brother     Allergies  Allergen Reactions  . Bactrim   . Codeine     vomiting  . Macrobid (Nitrofurantoin Monohydrate Macrocrystals)   . Nitrofurantoin     Current Outpatient Prescriptions on File Prior to Visit  Medication Sig Dispense Refill  . albuterol (PROVENTIL HFA;VENTOLIN HFA) 108 (90 BASE) MCG/ACT inhaler Inhale 2 puffs into the lungs every 4 (four) hours as needed for wheezing.  1 Inhaler  0  . cetirizine (ZYRTEC) 10 MG tablet Take 1 tablet (10 mg total) by mouth daily. One tab daily for allergies  30 tablet  1  . Cholecalciferol (VITAMIN D-3 PO) Take 1 tablet by mouth daily.        . citalopram (CELEXA) 40 MG tablet Take 40 mg by mouth daily.        Marland Kitchen  clonazePAM (KLONOPIN) 2 MG tablet Take 1.5 mg by mouth daily.       . fluticasone (FLONASE) 50 MCG/ACT nasal spray Place 1 spray into the nose 2 (two) times daily.  1 g  2  . Melatonin 3 MG CAPS Take by mouth 1 dose over 24 hours.        . mometasone (ELOCON) 0.1 % cream Apply topically daily.  45 g  0  . pravastatin (PRAVACHOL) 40 MG tablet TAKE ONE TABLET BY MOUTH EVERY DAY  30 tablet  0  . Probiotic Product (SOLUBLE FIBER/PROBIOTICS PO) Take 1 tablet by mouth daily.        . ranitidine (ZANTAC) 300 MG tablet Take 300 mg by mouth at bedtime.      Marland Kitchen ibuprofen (ADVIL,MOTRIN) 600 MG tablet Take 1 tablet (600 mg total) by mouth every 8 (eight) hours as needed for pain.  30 tablet  0    BP 110/70  Ht 5' 4.5" (1.638 m)  Wt 213 lb (96.616 kg)  BMI 36.00 kg/m2  LMP 05/19/2012chart    Objective:   Physical Exam  Constitutional:  She is oriented to person, place, and time. She appears well-developed and well-nourished.  Neck: Normal range of motion. Neck supple.  Cardiovascular: Normal rate, regular rhythm and normal heart sounds.   Pulmonary/Chest: Effort normal and breath sounds normal.  Abdominal: Soft. Bowel sounds are normal.  Musculoskeletal: Normal range of motion.  Neurological: She is alert and oriented to person, place, and time.  Skin: Skin is warm.  Psychiatric: She has a normal mood and affect.          Assessment & Plan:  assessment: Type 2 diabetes, hyperlipidemia, anxiety, depression, tobacco abuse  Plan: She recently had labs done, therefore, will await 3 months and obtain with her complete physical exam. Continue current medications. Continue to see psychiatry. I'll schedule patient to have a mammogram. Followup with our office as needed prior to 3 months.

## 2011-12-06 NOTE — Patient Instructions (Signed)

## 2011-12-11 ENCOUNTER — Encounter (HOSPITAL_COMMUNITY): Payer: Self-pay | Admitting: *Deleted

## 2011-12-11 ENCOUNTER — Emergency Department (INDEPENDENT_AMBULATORY_CARE_PROVIDER_SITE_OTHER): Payer: Medicare Other

## 2011-12-11 ENCOUNTER — Emergency Department (HOSPITAL_COMMUNITY)
Admission: EM | Admit: 2011-12-11 | Discharge: 2011-12-11 | Disposition: A | Discharge status: Home / Self Care | Payer: Medicare Other | Source: Home / Self Care | Attending: Family Medicine | Admitting: Family Medicine

## 2011-12-11 DIAGNOSIS — S91009A Unspecified open wound, unspecified ankle, initial encounter: Secondary | ICD-10-CM

## 2011-12-11 DIAGNOSIS — S99929A Unspecified injury of unspecified foot, initial encounter: Secondary | ICD-10-CM

## 2011-12-11 DIAGNOSIS — S8990XA Unspecified injury of unspecified lower leg, initial encounter: Secondary | ICD-10-CM

## 2011-12-11 DIAGNOSIS — S81009A Unspecified open wound, unspecified knee, initial encounter: Secondary | ICD-10-CM

## 2011-12-11 DIAGNOSIS — Z23 Encounter for immunization: Secondary | ICD-10-CM

## 2011-12-11 DIAGNOSIS — S81019A Laceration without foreign body, unspecified knee, initial encounter: Secondary | ICD-10-CM

## 2011-12-11 HISTORY — DX: Post-traumatic stress disorder, unspecified: F43.10

## 2011-12-11 MED ORDER — HYDROCODONE-ACETAMINOPHEN 5-325 MG PO TABS
ORAL_TABLET | ORAL | Status: AC
  Filled 2011-12-11: qty 1

## 2011-12-11 MED ORDER — IBUPROFEN 600 MG PO TABS: 600.0000 mg | ORAL_TABLET | Freq: Three times a day (TID) | ORAL | Status: DC | PRN

## 2011-12-11 MED ORDER — CEPHALEXIN 500 MG PO CAPS: 500.0000 mg | ORAL_CAPSULE | Freq: Two times a day (BID) | ORAL | Status: AC

## 2011-12-11 MED ORDER — HYDROCODONE-ACETAMINOPHEN 5-325 MG PO TABS
1.0000 | ORAL_TABLET | Freq: Once | ORAL | Status: AC
Start: 2011-12-11 — End: 2011-12-11
  Administered 2011-12-11: 1 via ORAL

## 2011-12-11 MED ORDER — TRAMADOL HCL 50 MG PO TABS: 50.0000 mg | ORAL_TABLET | Freq: Four times a day (QID) | ORAL | Status: AC | PRN

## 2011-12-11 MED ORDER — TETANUS-DIPHTH-ACELL PERTUSSIS 5-2.5-18.5 LF-MCG/0.5 IM SUSP
0.5000 mL | Freq: Once | INTRAMUSCULAR | Status: AC
  Administered 2011-12-11: 0.5 mL via INTRAMUSCULAR

## 2011-12-11 MED ORDER — TETANUS-DIPHTH-ACELL PERTUSSIS 5-2.5-18.5 LF-MCG/0.5 IM SUSP
INTRAMUSCULAR | Status: AC
  Filled 2011-12-11: qty 0.5

## 2011-12-11 NOTE — ED Notes (Signed)
Patient states she would like to take the hydrocodone if we provide some crackers.  Husband is driving home.

## 2011-12-11 NOTE — Discharge Instructions (Signed)
There is no obvious fracture in your x-rays but there is an image that give the slight possibility of a fracture or in your lower leg bone. Call the number provided above to make an appointment with the orthopedic in the next few days. Avoid putting weight on your right knee by using crutches. Take the prescribed medications as instructed. Keep wound clean and dry. Can remove dressing tomorrow. Follow handout instructions for wound care. Return in 8-10 days for suture removal or return earlier if redness swelling or drainage around the wound. Go to the emergency department if worsening pain despite following treatment, if you cannot see orthopedics in the next 1-3 days.

## 2011-12-11 NOTE — ED Notes (Signed)
States was walking and suddenly fell with RLE against a cement cinder block @ approx 1700 today.  Laceration with slight oozing blood noted to right knee w/ surrounding tenderness, swelling, and erythema.  Large superficial abrasion noted to right anterior lower leg.  Small superficial abrasion noted to right forearm.  Unsure of tetanus status.

## 2011-12-11 NOTE — ED Notes (Signed)
Crackers provided.

## 2011-12-12 NOTE — ED Provider Notes (Signed)
History     CSN: [id number]  Arrival date & time 12/11/11  1813   First MD Initiated Contact with Patient 12/11/11 1821      Chief Complaint  Patient presents with  . Fall  . Laceration  . Knee Pain    (Consider location/radiation/quality/duration/timing/severity/associated sxs/prior treatment) HPI Comments: 41 y/o smoker and diabetic female here c/o right knee injury about 2 hours ago. Was getting out of home porch to smoke and tripped landing on right knee making contact first with floor step stone, causing a laceration and several  abrasions. Able to bear weight. Reports pain with touching the knee around the wound. Minimal bleeding still present. Denies trauma to the head or any other areas than right leg. Denies dizziness, syncope or palpitations before her fall.    Past Medical History  Diagnosis Date  . Obesity   . Dyslipidemia   . Diabetes mellitus   . Hyperlipidemia   . Obesity   . Tobacco use disorder   . Anxiety     Dr. Donnie Aho, [facility]  . Depression     Clinical psychologist, Erlinda Hong  . ADD (attention deficit disorder)   . Cystocele     sees gyn, Dr. Reinaldo Meeker  . Rectocele   . PTSD (post-traumatic stress disorder)     Past Surgical History  Procedure Date  . Tubal ligation   . Cholecystectomy   . Diagnostic mammogram     never; sees Dr. Reinaldo Meeker  . Abdominal hysterectomy   . Incontinence surgery June 2012  . Bladder suspension   . Rectocele repair   . Cystocele repair     Family History  Problem Relation Age of Onset  . Depression Mother   . Hyperlipidemia Mother   . Hypertension Mother   . Diabetes Mother   . Hyperlipidemia Father   . Depression Father   . Nephrolithiasis Brother     History  Substance Use Topics  . Smoking status: Current Everyday Smoker -- 1.0 packs/day for 7 years    Last Attempt to Quit: 09/13/2011  . Smokeless tobacco: Former Neurosurgeon    Quit date: 08/11/2011  . Alcohol Use: No    OB History      Grav Para Term Preterm Abortions TAB SAB Ect Mult Living                  Review of Systems  Constitutional:       10 systems reviewed and  pertinent negative and positive symptoms are as per HPI.     Musculoskeletal:       As per HPI  All other systems reviewed and are negative.    Allergies  Bactrim; Codeine; Macrobid; Nitrofurantoin; and Sulfa antibiotics  Home Medications   Current Outpatient Rx  Name Route Sig Dispense Refill  . ALBUTEROL SULFATE HFA 108 (90 BASE) MCG/ACT IN AERS Inhalation Inhale 2 puffs into the lungs every 4 (four) hours as needed for wheezing. 1 Inhaler 0  . CETIRIZINE HCL 10 MG PO TABS Oral Take 1 tablet (10 mg total) by mouth daily. One tab daily for allergies 30 tablet 1  . CITALOPRAM HYDROBROMIDE 40 MG PO TABS Oral Take 20 mg by mouth 2 (two) times daily.     Marland Kitchen CLONAZEPAM 2 MG PO TABS Oral Take 0.5 mg by mouth 3 (three) times daily as needed.     Marland Kitchen PRAVASTATIN SODIUM 40 MG PO TABS  TAKE ONE TABLET BY MOUTH EVERY DAY 30 tablet 0  Patient needs med check [phone]  . SOLUBLE FIBER/PROBIOTICS PO Oral Take 1 tablet by mouth daily.      . CEPHALEXIN 500 MG PO CAPS Oral Take 1 capsule (500 mg total) by mouth 2 (two) times daily. 14 capsule 0  . VITAMIN D-3 PO Oral Take 1 tablet by mouth daily.      Marland Kitchen FLUTICASONE PROPIONATE 50 MCG/ACT NA SUSP Nasal Place 1 spray into the nose 2 (two) times daily. 1 g 2  . IBUPROFEN 600 MG PO TABS Oral Take 1 tablet (600 mg total) by mouth every 8 (eight) hours as needed for pain. 20 tablet 0  . MELATONIN 3 MG PO CAPS Oral Take by mouth 1 dose over 24 hours.      . MOMETASONE FUROATE 0.1 % EX CREA Topical Apply topically daily. 45 g 0  . RANITIDINE HCL 300 MG PO TABS Oral Take 300 mg by mouth as needed.     Marland Kitchen TRAMADOL HCL 50 MG PO TABS Oral Take 1 tablet (50 mg total) by mouth every 6 (six) hours as needed for pain. 15 tablet 0    BP 128/70  Pulse 62  Temp 97.5 F (36.4 C) (Oral)  Resp 16  SpO2 100%  LMP  11/12/2010  Physical Exam  Nursing note and vitals reviewed. Constitutional: She is oriented to person, place, and time. She appears well-developed and well-nourished. No distress.  HENT:  Head: Normocephalic and atraumatic.  Eyes: Conjunctivae and EOM are normal. Pupils are equal, round, and reactive to light.  Neck: Neck supple.  Cardiovascular: Normal rate, regular rhythm and normal heart sounds.   Pulmonary/Chest: Effort normal and breath sounds normal.  Musculoskeletal:       Right knee: erythema with mild swelling over the patella. There is a 2 cm horizontal contused laceration with minimal bledding. Reported tenderness to palpation lateral and above patella. No patella dislocation no crepitus. Does not impress knee effusion, no hyperlaxity. Normal active and passive knee flexion and extension with FROM. There is a large superficial abrasion with no hematoma or associated swelling located over mid anterior lower leg.   Right fore arm: with superficial abrasion. Patient able to bear weight on right lower extremity with reported discomfort.   Neurological: She is alert and oriented to person, place, and time.  Skin:       As per MS exam above.    ED Course  LACERATION REPAIR Performed by: Sharin Grave Authorized by: Sharin Grave Consent: Verbal consent obtained. Risks and benefits: risks, benefits and alternatives were discussed Consent given by: patient Patient understanding: patient states understanding of the procedure being performed Patient consent: the patient's understanding of the procedure matches consent given Body area: lower extremity Location details: right knee Laceration length: 2 cm Contamination: The wound is contaminated. Foreign body present: dirt. Tendon involvement: none Nerve involvement: none Anesthesia: local infiltration Local anesthetic: lidocaine 2% with epinephrine Anesthetic total: 3 ml Preparation: Patient was prepped and draped  in the usual sterile fashion. Irrigation solution: saline Irrigation method: syringe Amount of cleaning: extensive Debridement: minimal Degree of undermining: minimal Skin closure: 3-0 Prolene Number of sutures: 4 Technique: simple Approximation: close Dressing: antibiotic ointment and 4x4 sterile gauze Patient tolerance: Patient tolerated the procedure well with no immediate complications. Comments: Elastic bandage was used for knee immobilization. Patient was placed on crutches. All abrasions were cleaned and covered with antibiotic ointment.   (including critical care time)  Labs Reviewed - No data to display Dg Knee Complete 4  Views Right  12/11/2011  *RADIOLOGY REPORT*  Clinical Data: Fall  RIGHT KNEE - COMPLETE 4+ VIEW  Comparison: None.  Findings: The knee is aligned.  There is no evidence of joint effusion.  There is slight angulation of the lateral cortex of the fibular neck that is favored to be within normal limits.  No definite fracture line is seen; however, suggest correlation with site of patient pain, as it is difficult to completely exclude a nondisplaced fracture.  The joint spaces of the knee are maintained.  IMPRESSION: Slight angulation of the lateral cortex of the fibular neck is favored to be within normal limits.  Suggest correlation with site of pain, as a nondisplaced fracture is difficult to completely exclude.  Original Report Authenticated By: Britta Mccreedy, [name]     1. Knee laceration   2. Knee injury       MDM  Right knee laceration repaired. No obvious fracture on X-rays. No focal pain reported by patient at side of possible angulation of fibula. Still decided to counsel patient to avoid weight bearing and provided crutches, until re-evaluated by orthopedic in next 1-3 days. Wound care, supportive care and pain management instructions provided.  Prescribed keflex for prophylaxis. Asked to return in 8-10 days for suture removal or earlier if redness,  swelling or drainage of the wound.         Sharin Grave, MD 12/12/11 1637

## 2011-12-13 ENCOUNTER — Telehealth: Payer: Self-pay | Admitting: Family

## 2011-12-13 NOTE — Telephone Encounter (Signed)
Called and spoke with patient . She actually just wanted to ask about results regarding calcium and potassium. Advised patient that these two results were in normal range as for 11/27/2011.

## 2011-12-13 NOTE — Telephone Encounter (Signed)
[name] has transferred care to [facility], but is requesting results from last visit for labs.  Would like to have a call back regarding this.  Informed her also she will need to sign a release of info to have records sent.

## 2011-12-14 ENCOUNTER — Ambulatory Visit: Payer: Medicare Other | Admitting: Family

## 2011-12-20 ENCOUNTER — Ambulatory Visit (INDEPENDENT_AMBULATORY_CARE_PROVIDER_SITE_OTHER): Payer: Medicare Other | Admitting: Family

## 2011-12-20 ENCOUNTER — Encounter: Payer: Self-pay | Admitting: Family

## 2011-12-20 VITALS — BP 108/70 | HR 88 | Wt 216.0 lb

## 2011-12-20 DIAGNOSIS — Z4802 Encounter for removal of sutures: Secondary | ICD-10-CM

## 2011-12-20 DIAGNOSIS — S81011A Laceration without foreign body, right knee, initial encounter: Secondary | ICD-10-CM

## 2011-12-20 DIAGNOSIS — S81009A Unspecified open wound, unspecified knee, initial encounter: Secondary | ICD-10-CM

## 2011-12-20 NOTE — Progress Notes (Signed)
  Subjective:    Patient ID: Dionisio David, female    DOB: Jan 02, 1971, 41 y.o.   MRN: [id number]  HPI 41 year old white female, and sent for suture removal. She was seen at urgent care clinic after a fall and had 4 sutures to her right knee. Area is healing well. Denies any concerns.   Review of Systems  Constitutional: Negative.   Respiratory: Negative.   Skin: Positive for wound.       To the right knee after a fall.       Objective:   Physical Exam  Constitutional: She is oriented to person, place, and time. She appears well-developed and well-nourished.  Cardiovascular: Normal rate, regular rhythm and normal heart sounds.   Pulmonary/Chest: Effort normal and breath sounds normal.  Neurological: She is alert and oriented to person, place, and time.  Skin: Skin is warm and dry.       Laceration to the right knee. 4 Sutures in right knee. Appears to be healing well. No drainage or discharge.  Psychiatric: She has a normal mood and affect.      4 sutures removed from the right knee. It is well approximated. No drainage or discharge. Healing well. Patient tolerated procedure well.    Assessment & Plan:  Assessment: Suture removal  Plan: Triple antibiotic ointment applied to the affected area twice daily. Call the office with any questions or concerns. Signs and symptoms of infection were discussed. Recheck as scheduled.

## 2012-01-31 ENCOUNTER — Telehealth: Payer: Self-pay | Admitting: Family

## 2012-01-31 NOTE — Telephone Encounter (Signed)
Caller: [name]/Patient; PCP: Adline Mango; CB#: [phone]; Call regarding Urinary Pain/Bleeding; Onset 01/29/12. Temp not taking.  Taking Tylenol q 4 hrs.  Symptoms include nausea, frequency, urgency, pelvic pressure, and urine odor.  LMP/hysterectomy.  No appointments remain for 01/31/12. Advised to see [facility] within 4 hours for urinary tract symptoms and low back pain per Urinary Symptoms Guideline. States may not go to Urgent Care tonight; if  does not go, will keep appt  already scheduled for 02/01/12 at 1415.

## 2012-02-01 ENCOUNTER — Ambulatory Visit (INDEPENDENT_AMBULATORY_CARE_PROVIDER_SITE_OTHER): Payer: BC Managed Care – PPO | Admitting: Family

## 2012-02-01 ENCOUNTER — Encounter: Payer: Self-pay | Admitting: Family

## 2012-02-01 VITALS — BP 110/78 | HR 93 | Temp 98.3°F | Wt 215.0 lb

## 2012-02-01 DIAGNOSIS — R3915 Urgency of urination: Secondary | ICD-10-CM

## 2012-02-01 DIAGNOSIS — N390 Urinary tract infection, site not specified: Secondary | ICD-10-CM

## 2012-02-01 DIAGNOSIS — R350 Frequency of micturition: Secondary | ICD-10-CM

## 2012-02-01 LAB — POCT URINALYSIS DIPSTICK
Glucose, UA: NEGATIVE
Leukocytes, UA: NEGATIVE
Nitrite, UA: NEGATIVE
Protein, UA: NEGATIVE
Urobilinogen, UA: 0.2

## 2012-02-01 MED ORDER — FLUCONAZOLE 150 MG PO TABS: 150.0000 mg | ORAL_TABLET | Freq: Once | ORAL | Status: AC

## 2012-02-01 MED ORDER — CEPHALEXIN 500 MG PO TABS: 500.0000 mg | ORAL_TABLET | Freq: Two times a day (BID) | ORAL | Status: AC

## 2012-02-01 NOTE — Progress Notes (Signed)
Subjective:    Patient ID: [name], female    DOB: May 19, 1971, 41 y.o.   MRN: [id number]  HPI 41 year old white female, nonsmoker, is in today with complaints of urinary frequency, urgency, pressure, abdominal pain and back pain x2 days. She called [facility] last evening who suggested she come in today. She had a leftover prescription for cephalexin but she's taken 2 doses. However, she does not have enough to complete a therapy. She denies any fever, muscle aches or pain.   Review of Systems  Constitutional: Negative.   Respiratory: Negative.   Cardiovascular: Negative.   Gastrointestinal: Positive for abdominal pain. Negative for nausea, vomiting, diarrhea and constipation.  Genitourinary: Positive for urgency and frequency. Negative for hematuria and vaginal discharge.  Musculoskeletal: Negative.   Hematological: Negative.   Psychiatric/Behavioral: Negative.    Past Medical History  Diagnosis Date  . Obesity   . Dyslipidemia   . Diabetes mellitus   . Hyperlipidemia   . Obesity   . Tobacco use disorder   . Anxiety     Dr. Donnie Aho, [facility]  . Depression     Clinical psychologist, Erlinda Hong  . ADD (attention deficit disorder)   . Cystocele     sees gyn, Dr. Reinaldo Meeker  . Rectocele   . PTSD (post-traumatic stress disorder)     History   Social History  . Marital Status: Married    Spouse Name: N/A    Number of Children: 2  . Years of Education: N/A   Occupational History  . Not on file.   Social History Main Topics  . Smoking status: Current Everyday Smoker -- 1.0 packs/day for 7 years    Last Attempt to Quit: 09/13/2011  . Smokeless tobacco: Former Neurosurgeon    Quit date: 08/11/2011  . Alcohol Use: No  . Drug Use: No  . Sexually Active: Not on file     trying to get disability; lives with husband; has children, but mother has custody   Other Topics Concern  . Not on file   Social History Narrative  . No narrative on file     Past Surgical History  Procedure Date  . Tubal ligation   . Cholecystectomy   . Diagnostic mammogram     never; sees Dr. Reinaldo Meeker  . Abdominal hysterectomy   . Incontinence surgery June 2012  . Bladder suspension   . Rectocele repair   . Cystocele repair     Family History  Problem Relation Age of Onset  . Depression Mother   . Hyperlipidemia Mother   . Hypertension Mother   . Diabetes Mother   . Hyperlipidemia Father   . Depression Father   . Nephrolithiasis Brother     Allergies  Allergen Reactions  . Bactrim   . Codeine     vomiting  . Macrobid (Nitrofurantoin Monohydrate Macrocrystals)   . Nitrofurantoin   . Sulfa Antibiotics     Current Outpatient Prescriptions on File Prior to Visit  Medication Sig Dispense Refill  . albuterol (PROVENTIL HFA;VENTOLIN HFA) 108 (90 BASE) MCG/ACT inhaler Inhale 2 puffs into the lungs every 4 (four) hours as needed for wheezing.  1 Inhaler  0  . cetirizine (ZYRTEC) 10 MG tablet Take 1 tablet (10 mg total) by mouth daily. One tab daily for allergies  30 tablet  1  . Cholecalciferol (VITAMIN D-3 PO) Take 1 tablet by mouth daily.        . citalopram (CELEXA) 40 MG tablet Take 20  mg by mouth 2 (two) times daily.       . clonazePAM (KLONOPIN) 2 MG tablet Take 0.5 mg by mouth 3 (three) times daily as needed.       . fluticasone (FLONASE) 50 MCG/ACT nasal spray Place 1 spray into the nose 2 (two) times daily.  1 g  2  . Melatonin 3 MG CAPS Take by mouth 1 dose over 24 hours.        . mometasone (ELOCON) 0.1 % cream Apply topically daily.  45 g  0  . pravastatin (PRAVACHOL) 40 MG tablet TAKE ONE TABLET BY MOUTH EVERY DAY  30 tablet  0  . Probiotic Product (SOLUBLE FIBER/PROBIOTICS PO) Take 1 tablet by mouth daily.        . ranitidine (ZANTAC) 300 MG tablet Take 300 mg by mouth as needed.       Marland Kitchen ibuprofen (ADVIL,MOTRIN) 600 MG tablet Take 1 tablet (600 mg total) by mouth every 8 (eight) hours as needed for pain.  20 tablet  0    BP  110/78  Pulse 93  Temp 98.3 F (36.8 C) (Oral)  Wt 215 lb (97.523 kg)  SpO2 95%  LMP 05/19/2012chart     Objective:   Physical Exam  Constitutional: She is oriented to person, place, and time. She appears well-developed and well-nourished.  Cardiovascular: Normal rate, regular rhythm and normal heart sounds.   Pulmonary/Chest: Effort normal and breath sounds normal.  Abdominal: Soft. Bowel sounds are normal. There is no tenderness. There is no rebound and no guarding.  Neurological: She is alert and oriented to person, place, and time.  Skin: Skin is warm and dry.  Psychiatric: She has a normal mood and affect.          Assessment & Plan:  Assessment: Urinary frequency, dysuria  Plan: Continue cephalexin 5 mg twice daily x7 days. Avoid caffeine. Drink plenty of water. Patient call the office if symptoms worsen or persist. Recheck a schedule, when necessary.

## 2012-02-08 ENCOUNTER — Other Ambulatory Visit: Payer: Self-pay | Admitting: Medical

## 2012-02-08 ENCOUNTER — Telehealth: Payer: Self-pay | Admitting: Family

## 2012-02-08 NOTE — Telephone Encounter (Signed)
Pt called and is going to need a refill of pravastatin (PRAVACHOL) 40 MG tablet to [facility] in [place], [place] [phone]. Pt has labs and cpx schd in Sept 2013. Pls call in asap.

## 2012-02-09 ENCOUNTER — Other Ambulatory Visit: Payer: Self-pay | Admitting: Family

## 2012-02-09 MED ORDER — PRAVASTATIN SODIUM 40 MG PO TABS: 40.0000 mg | ORAL_TABLET | Freq: Every day | ORAL | Status: DC

## 2012-02-09 NOTE — Telephone Encounter (Signed)
Megan from [facility] on [address] calling. Has received no answer on this. Can you ask Padonda please, and send in if ok to refill?

## 2012-02-09 NOTE — Telephone Encounter (Signed)
Sent by e-scribe. 

## 2012-02-20 ENCOUNTER — Telehealth: Payer: Self-pay | Admitting: Family

## 2012-02-20 NOTE — Telephone Encounter (Signed)
Pls advise.  

## 2012-02-20 NOTE — Telephone Encounter (Signed)
Pt called and said that she tried calling [facility] and could not get anyone. Pt says that she has finished the abx, but still has bladder inf. Pt is req refill of abx to [facility] in [place], [place].

## 2012-02-20 NOTE — Telephone Encounter (Signed)
Caller: [name]/Patient; Patient Name: [name], [name]; PCP: Hopper, William; Best Callback Phone Number: [phone] °Pt talked with the office yesterday (she was not seen) for back pain. Pt was prescribed Cyclobenzapine - she took 2 yesterday and 1 today. She was instructed to call back if the muscle relaxers worked and the MD would call in more medication. Pt states her back pain feels much better - sitting and walking is still painful but better than yesterday. Pt had been lifting boxes/helping someone move.  Pt uses [facility] on [address] [phone].  °

## 2012-02-21 ENCOUNTER — Telehealth: Payer: Self-pay | Admitting: Family

## 2012-02-21 ENCOUNTER — Ambulatory Visit (INDEPENDENT_AMBULATORY_CARE_PROVIDER_SITE_OTHER)
Admission: RE | Admit: 2012-02-21 | Discharge: 2012-02-21 | Disposition: A | Discharge status: Home / Self Care | Payer: Medicare Other | Source: Ambulatory Visit | Attending: Family | Admitting: Family

## 2012-02-21 ENCOUNTER — Ambulatory Visit (INDEPENDENT_AMBULATORY_CARE_PROVIDER_SITE_OTHER): Payer: BC Managed Care – PPO | Admitting: Family

## 2012-02-21 ENCOUNTER — Encounter: Payer: Self-pay | Admitting: Family

## 2012-02-21 VITALS — BP 100/70 | Temp 98.4°F | Wt 215.0 lb

## 2012-02-21 DIAGNOSIS — N390 Urinary tract infection, site not specified: Secondary | ICD-10-CM

## 2012-02-21 LAB — POCT URINALYSIS DIPSTICK
Leukocytes, UA: NEGATIVE
Nitrite, UA: POSITIVE
Protein, UA: NEGATIVE
Urobilinogen, UA: 1
pH, UA: 7

## 2012-02-21 MED ORDER — DICLOFENAC SODIUM 75 MG PO TBEC: 75.0000 mg | DELAYED_RELEASE_TABLET | Freq: Two times a day (BID) | ORAL | Status: DC

## 2012-02-21 MED ORDER — TRAMADOL HCL 50 MG PO TABS: 50.0000 mg | ORAL_TABLET | Freq: Three times a day (TID) | ORAL | Status: AC | PRN

## 2012-02-21 NOTE — Telephone Encounter (Signed)
Pt called req to get xray results. Pls call asap.  °

## 2012-02-21 NOTE — Telephone Encounter (Signed)
Patient has an OV today.

## 2012-02-21 NOTE — Telephone Encounter (Signed)
Called and spoke with pt about results. 

## 2012-02-21 NOTE — Progress Notes (Signed)
Subjective:    Patient ID: [name], female    DOB: 1970-08-01, 41 y.o.   MRN: [id number]  HPI  41 year old white female, is in with c/o low back pain that radiates down her left thigh x 2 weeks. She was seen 2 weeks ago and thought she had a urinary tract infection. She was treated and symptoms of low back pain continued. Rates her pain 5/10, worse with movement. Has been taking Ibuprofen with no relief. Has urinary frequency and urgency but has had a bladder sling placed a few years ago.  Review of Systems  Constitutional: Negative.   Respiratory: Negative.   Cardiovascular: Negative.   Gastrointestinal: Negative.   Genitourinary: Positive for urgency and frequency. Negative for dysuria and vaginal discharge.  Musculoskeletal: Positive for back pain. Negative for joint swelling.  Skin: Negative.   Neurological: Negative.   Hematological: Negative.   Psychiatric/Behavioral: Negative.    Past Medical History  Diagnosis Date  . Obesity   . Dyslipidemia   . Diabetes mellitus   . Hyperlipidemia   . Obesity   . Tobacco use disorder   . Anxiety     Dr. Donnie Aho, [facility]  . Depression     Clinical psychologist, Erlinda Hong  . ADD (attention deficit disorder)   . Cystocele     sees gyn, Dr. Reinaldo Meeker  . Rectocele   . PTSD (post-traumatic stress disorder)     History   Social History  . Marital Status: Married    Spouse Name: N/A    Number of Children: 2  . Years of Education: N/A   Occupational History  . Not on file.   Social History Main Topics  . Smoking status: Current Everyday Smoker -- 1.0 packs/day for 7 years    Last Attempt to Quit: 09/13/2011  . Smokeless tobacco: Former Neurosurgeon    Quit date: 08/11/2011  . Alcohol Use: No  . Drug Use: No  . Sexually Active: Not on file     trying to get disability; lives with husband; has children, but mother has custody   Other Topics Concern  . Not on file   Social History Narrative  . No  narrative on file    Past Surgical History  Procedure Date  . Tubal ligation   . Cholecystectomy   . Diagnostic mammogram     never; sees Dr. Reinaldo Meeker  . Abdominal hysterectomy   . Incontinence surgery June 2012  . Bladder suspension   . Rectocele repair   . Cystocele repair     Family History  Problem Relation Age of Onset  . Depression Mother   . Hyperlipidemia Mother   . Hypertension Mother   . Diabetes Mother   . Hyperlipidemia Father   . Depression Father   . Nephrolithiasis Brother     Allergies  Allergen Reactions  . Bactrim   . Codeine     vomiting  . Macrobid (Nitrofurantoin Monohydrate Macrocrystals)   . Nitrofurantoin   . Sulfa Antibiotics     Current Outpatient Prescriptions on File Prior to Visit  Medication Sig Dispense Refill  . albuterol (PROVENTIL HFA;VENTOLIN HFA) 108 (90 BASE) MCG/ACT inhaler Inhale 2 puffs into the lungs every 4 (four) hours as needed for wheezing.  1 Inhaler  0  . cetirizine (ZYRTEC) 10 MG tablet Take 1 tablet (10 mg total) by mouth daily. One tab daily for allergies  30 tablet  1  . Cholecalciferol (VITAMIN D-3 PO) Take 1 tablet by  mouth daily.        . citalopram (CELEXA) 40 MG tablet Take 20 mg by mouth 2 (two) times daily.       . clonazePAM (KLONOPIN) 2 MG tablet Take 0.5 mg by mouth 3 (three) times daily as needed.       . fluticasone (FLONASE) 50 MCG/ACT nasal spray Place 1 spray into the nose 2 (two) times daily.  1 g  2  . Melatonin 3 MG CAPS Take by mouth 1 dose over 24 hours.        . mometasone (ELOCON) 0.1 % cream Apply topically daily.  45 g  0  . pravastatin (PRAVACHOL) 40 MG tablet Take 1 tablet (40 mg total) by mouth daily.  30 tablet  0  . Probiotic Product (SOLUBLE FIBER/PROBIOTICS PO) Take 1 tablet by mouth daily.        . ranitidine (ZANTAC) 300 MG tablet Take 300 mg by mouth as needed.         BP 100/70  Temp 98.4 F (36.9 C) (Oral)  Wt 215 lb (97.523 kg)  LMP 05/19/2012chart    Objective:   Physical  Exam  Constitutional: She is oriented to person, place, and time. She appears well-developed and well-nourished.  Neck: Normal range of motion. Neck supple.  Cardiovascular: Normal rate, regular rhythm and normal heart sounds.   Pulmonary/Chest: Effort normal and breath sounds normal.  Abdominal: Soft. Bowel sounds are normal.  Musculoskeletal: She exhibits no edema and no tenderness.       Tenderness to palpation of the lower lumbar spine medially. Mild pain with rotation. She has a 90 degree flexion at the hip.   Neurological: She is alert and oriented to person, place, and time. She has normal reflexes. No cranial nerve deficit. Coordination normal.  Skin: Skin is warm and dry.  Psychiatric: She has a normal mood and affect.          Assessment & Plan:  Assessment: Low back Pain, Lumbar Radiculopathy  Plan: Xray lspine. Tramadol as needed. Voltaren 75mg  twice a day with food. Discontinue Ibuprofen. Ice and heat. Call the office if symptoms worsen or persist. Recheck as scheduled. Urine culture sent.

## 2012-02-21 NOTE — Patient Instructions (Addendum)

## 2012-02-21 NOTE — Progress Notes (Signed)
Quick Note:  Called and spoke with pt and pt is aware of x-ray results. ______

## 2012-02-23 LAB — URINE CULTURE: Colony Count: 15000

## 2012-02-29 ENCOUNTER — Telehealth: Payer: Self-pay

## 2012-02-29 ENCOUNTER — Other Ambulatory Visit (INDEPENDENT_AMBULATORY_CARE_PROVIDER_SITE_OTHER): Payer: BC Managed Care – PPO

## 2012-02-29 DIAGNOSIS — Z0000 Encounter for general adult medical examination without abnormal findings: Secondary | ICD-10-CM

## 2012-02-29 LAB — CBC WITH DIFFERENTIAL/PLATELET
Basophils Absolute: 0 10*3/uL (ref 0.0–0.1)
Basophils Relative: 0.8 % (ref 0.0–3.0)
Eosinophils Absolute: 0.3 10*3/uL (ref 0.0–0.7)
MCHC: 32.8 g/dL (ref 30.0–36.0)
MCV: 92.8 fl (ref 78.0–100.0)
Monocytes Absolute: 0.5 10*3/uL (ref 0.1–1.0)
Neutrophils Relative %: 59 % (ref 43.0–77.0)
RBC: 4.38 Mil/uL (ref 3.87–5.11)
RDW: 12.8 % (ref 11.5–14.6)

## 2012-02-29 LAB — POCT URINALYSIS DIPSTICK
Bilirubin, UA: NEGATIVE
Leukocytes, UA: NEGATIVE
Nitrite, UA: NEGATIVE
Protein, UA: NEGATIVE
pH, UA: 7

## 2012-02-29 LAB — LIPID PANEL
LDL Cholesterol: 74 mg/dL (ref 0–99)
Total CHOL/HDL Ratio: 3
VLDL: 19 mg/dL (ref 0.0–40.0)

## 2012-02-29 LAB — BASIC METABOLIC PANEL
BUN: 15 mg/dL (ref 6–23)
CO2: 28 mEq/L (ref 19–32)
Chloride: 101 mEq/L (ref 96–112)
Creatinine, Ser: 0.7 mg/dL (ref 0.4–1.2)
Glucose, Bld: 107 mg/dL — ABNORMAL HIGH (ref 70–99)

## 2012-02-29 LAB — HEPATIC FUNCTION PANEL
Albumin: 3.9 g/dL (ref 3.5–5.2)
Bilirubin, Direct: 0 mg/dL (ref 0.0–0.3)
Total Protein: 7 g/dL (ref 6.0–8.3)

## 2012-02-29 LAB — TSH: TSH: 0.84 u[IU]/mL (ref 0.35–5.50)

## 2012-02-29 NOTE — Telephone Encounter (Signed)
Pt requesting lab results.  Pt advised labs normal

## 2012-03-07 ENCOUNTER — Encounter: Payer: Medicare Other | Admitting: Family

## 2012-03-15 ENCOUNTER — Ambulatory Visit (INDEPENDENT_AMBULATORY_CARE_PROVIDER_SITE_OTHER): Payer: Medicare Other | Admitting: Family

## 2012-03-15 ENCOUNTER — Encounter: Payer: Self-pay | Admitting: Family

## 2012-03-15 VITALS — BP 116/70 | HR 84 | Temp 98.9°F | Ht 63.0 in | Wt 212.0 lb

## 2012-03-15 DIAGNOSIS — Z0000 Encounter for general adult medical examination without abnormal findings: Secondary | ICD-10-CM

## 2012-03-15 DIAGNOSIS — Z23 Encounter for immunization: Secondary | ICD-10-CM

## 2012-03-15 DIAGNOSIS — E119 Type 2 diabetes mellitus without complications: Secondary | ICD-10-CM

## 2012-03-15 NOTE — Progress Notes (Signed)
Subjective:    Patient ID: [name], female    DOB: January 07, 1971, 41 y.o.   MRN: [id number]  HPI This is a routine physical examination for this healthy  Female. Reviewed all health maintenance protocols including mammography and reviewed appropriate screening labs. Her immunization history was reviewed as well as her current medications and allergies refills of her chronic medications were given and the plan for yearly health maintenance was discussed all orders and referrals were made as appropriate.   Review of Systems  Constitutional: Negative.   HENT: Negative.   Eyes: Negative.   Respiratory: Negative.   Cardiovascular: Negative.   Gastrointestinal: Negative.   Genitourinary: Negative.   Musculoskeletal: Negative.   Skin: Negative.   Neurological: Negative.   Hematological: Negative.   Psychiatric/Behavioral: Negative.    Past Medical History  Diagnosis Date  . Obesity   . Dyslipidemia   . Diabetes mellitus   . Hyperlipidemia   . Obesity   . Tobacco use disorder   . Anxiety     Dr. Donnie Aho, [facility]  . Depression     Clinical psychologist, Erlinda Hong  . ADD (attention deficit disorder)   . Cystocele     sees gyn, Dr. Reinaldo Meeker  . Rectocele   . PTSD (post-traumatic stress disorder)     History   Social History  . Marital Status: Married    Spouse Name: N/A    Number of Children: 2  . Years of Education: N/A   Occupational History  . Not on file.   Social History Main Topics  . Smoking status: Current Every Day Smoker -- 1.0 packs/day for 7 years    Last Attempt to Quit: 09/13/2011  . Smokeless tobacco: Former Neurosurgeon    Quit date: 08/11/2011  . Alcohol Use: No  . Drug Use: No  . Sexually Active: Not on file     trying to get disability; lives with husband; has children, but mother has custody   Other Topics Concern  . Not on file   Social History Narrative  . No narrative on file    Past Surgical History  Procedure Date    . Tubal ligation   . Cholecystectomy   . Diagnostic mammogram     never; sees Dr. Reinaldo Meeker  . Abdominal hysterectomy   . Incontinence surgery June 2012  . Bladder suspension   . Rectocele repair   . Cystocele repair     Family History  Problem Relation Age of Onset  . Depression Mother   . Hyperlipidemia Mother   . Hypertension Mother   . Diabetes Mother   . Hyperlipidemia Father   . Depression Father   . Nephrolithiasis Brother     Allergies  Allergen Reactions  . Bactrim   . Codeine     vomiting  . Macrobid (Nitrofurantoin Monohydrate Macrocrystals)   . Nitrofurantoin   . Sulfa Antibiotics     Current Outpatient Prescriptions on File Prior to Visit  Medication Sig Dispense Refill  . albuterol (PROVENTIL HFA;VENTOLIN HFA) 108 (90 BASE) MCG/ACT inhaler Inhale 2 puffs into the lungs every 4 (four) hours as needed for wheezing.  1 Inhaler  0  . cetirizine (ZYRTEC) 10 MG tablet Take 1 tablet (10 mg total) by mouth daily. One tab daily for allergies  30 tablet  1  . Cholecalciferol (VITAMIN D-3 PO) Take 1 tablet by mouth daily.        . citalopram (CELEXA) 40 MG tablet Take 20 mg by mouth  2 (two) times daily.       . clonazePAM (KLONOPIN) 2 MG tablet Take 0.5 mg by mouth 3 (three) times daily as needed.       . diclofenac (VOLTAREN) 75 MG EC tablet Take 1 tablet (75 mg total) by mouth 2 (two) times daily.  30 tablet  2  . fluticasone (FLONASE) 50 MCG/ACT nasal spray Place 1 spray into the nose 2 (two) times daily.  1 g  2  . mometasone (ELOCON) 0.1 % cream Apply topically daily.  45 g  0  . pravastatin (PRAVACHOL) 40 MG tablet Take 1 tablet (40 mg total) by mouth daily.  30 tablet  0  . Probiotic Product (SOLUBLE FIBER/PROBIOTICS PO) Take 1 tablet by mouth daily.        . ranitidine (ZANTAC) 300 MG tablet Take 300 mg by mouth as needed.       . Melatonin 3 MG CAPS Take by mouth 1 dose over 24 hours.          BP 116/70  Pulse 84  Temp 98.9 F (37.2 C) (Oral)  Ht 5\' 3"   (1.6 m)  Wt 212 lb (96.163 kg)  BMI 37.55 kg/m2  SpO2 96%  LMP 05/19/2012chart    Objective:   Physical Exam  Constitutional: She is oriented to person, place, and time. She appears well-developed and well-nourished.  Neck: Normal range of motion. Neck supple.  Cardiovascular: Normal rate, regular rhythm and normal heart sounds.   Pulmonary/Chest: Effort normal and breath sounds normal.  Abdominal: Soft. Bowel sounds are normal.  Musculoskeletal: Normal range of motion.  Neurological: She is alert and oriented to person, place, and time.  Skin: Skin is warm and dry.  Psychiatric: She has a normal mood and affect.    Influenza Vaccine given      Assessment & Plan:  Assessment: Preventative Healthcare, Anxiety, GERD  Plan: Mammogram referral. Continue current meds. Recheck in 4 months and sooner as needed.

## 2012-03-26 ENCOUNTER — Telehealth: Payer: Self-pay | Admitting: Family

## 2012-03-26 ENCOUNTER — Other Ambulatory Visit: Payer: Self-pay

## 2012-03-26 MED ORDER — FREESTYLE LANCETS MISC: Status: DC

## 2012-03-26 MED ORDER — GLUCOSE BLOOD VI STRP: ORAL_STRIP | Status: DC

## 2012-03-26 NOTE — Telephone Encounter (Signed)
Done and pt aware

## 2012-03-26 NOTE — Telephone Encounter (Signed)
Pt called and was given samples of Freestyle Freedom Lite test strips and pt would like to get a rx called in to [facility] in [place], [place]. Pls notify pt when done. Pt is out of strips.

## 2012-03-27 ENCOUNTER — Telehealth: Payer: Self-pay | Admitting: Family

## 2012-03-27 ENCOUNTER — Other Ambulatory Visit: Payer: Self-pay

## 2012-03-27 MED ORDER — FREESTYLE LANCETS MISC: Status: DC

## 2012-03-27 MED ORDER — GLUCOSE BLOOD VI STRP: ORAL_STRIP | Status: DC

## 2012-03-27 NOTE — Telephone Encounter (Signed)
Pt called and said that [facility] needs dx code for Test strips and Lancets. Pls send a new script with that information on it.

## 2012-04-02 ENCOUNTER — Ambulatory Visit (INDEPENDENT_AMBULATORY_CARE_PROVIDER_SITE_OTHER): Payer: Medicare Other | Admitting: Family Medicine

## 2012-04-02 ENCOUNTER — Encounter: Payer: Self-pay | Admitting: Family Medicine

## 2012-04-02 VITALS — BP 120/74 | HR 106 | Temp 98.7°F | Wt 213.0 lb

## 2012-04-02 DIAGNOSIS — J069 Acute upper respiratory infection, unspecified: Secondary | ICD-10-CM

## 2012-04-02 MED ORDER — FLUTICASONE PROPIONATE 50 MCG/ACT NA SUSP: 2.0000 | Freq: Every day | NASAL | Status: DC

## 2012-04-02 MED ORDER — BENZONATATE 100 MG PO CAPS: 100.0000 mg | ORAL_CAPSULE | Freq: Two times a day (BID) | ORAL | Status: DC | PRN

## 2012-04-02 NOTE — Patient Instructions (Addendum)
INSTRUCTIONS FOR UPPER RESPIRATORY INFECTION:  -plenty of rest and fluids  -nasal saline (use prepackaged nasal saline or bottled/distilled water if making your own)  -clean nose with nasal saline before using the nasal steroid or sinex  -can use sinex nasal spray for drainage and nasal congestion - but do NOT use longer then 3-4 days  -can use tylenol or ibuprofen as directed for aches and sorethroat  -if you are taking a cough medication - use only as directed  -follow up if fevers, worsening or not better in in 7 days   

## 2012-04-02 NOTE — Progress Notes (Signed)
Chief Complaint  Patient presents with  . Cough    draiinage, ears stopped up     HPI: -started 2 days ago -symptoms: nasal congestion, cough - musinex made this worse, ear fullness -denies: fevers, wheezing, NVD, SOB -has tried musinex and benadryl  ROS: See pertinent positives and negatives per HPI.  Past Medical History  Diagnosis Date  . Obesity   . Dyslipidemia   . Diabetes mellitus   . Hyperlipidemia   . Obesity   . Tobacco use disorder   . Anxiety     Dr. Donnie Aho, [facility]  . Depression     Clinical psychologist, Erlinda Hong  . ADD (attention deficit disorder)   . Cystocele     sees gyn, Dr. Reinaldo Meeker  . Rectocele   . PTSD (post-traumatic stress disorder)     Family History  Problem Relation Age of Onset  . Depression Mother   . Hyperlipidemia Mother   . Hypertension Mother   . Diabetes Mother   . Hyperlipidemia Father   . Depression Father   . Nephrolithiasis Brother     History   Social History  . Marital Status: Married    Spouse Name: N/A    Number of Children: 2  . Years of Education: N/A   Social History Main Topics  . Smoking status: Current Every Day Smoker -- 1.0 packs/day for 7 years    Last Attempt to Quit: 09/13/2011  . Smokeless tobacco: Former Neurosurgeon    Quit date: 08/11/2011  . Alcohol Use: No  . Drug Use: No  . Sexually Active: None     trying to get disability; lives with husband; has children, but mother has custody   Other Topics Concern  . None   Social History Narrative  . None    Current outpatient prescriptions:busPIRone (BUSPAR) 10 MG tablet, Take 5 mg by mouth 2 (two) times daily. , Disp: , Rfl: ;  citalopram (CELEXA) 40 MG tablet, Take 20 mg by mouth 2 (two) times daily. , Disp: , Rfl: ;  clonazePAM (KLONOPIN) 2 MG tablet, Take 0.5 mg by mouth 3 (three) times daily as needed. , Disp: , Rfl: ;  diclofenac (VOLTAREN) 75 MG EC tablet, Take 1 tablet (75 mg total) by mouth 2 (two) times daily., Disp: 30  tablet, Rfl: 2 glucose blood (FREESTYLE LITE) test strip, Use to check glucose once daily, Disp: 32 each, Rfl: 6;  Lancets (FREESTYLE) lancets, Use to check glucose once daily, Disp: 100 each, Rfl: 2;  pravastatin (PRAVACHOL) 40 MG tablet, Take 1 tablet (40 mg total) by mouth daily., Disp: 30 tablet, Rfl: 0;  ranitidine (ZANTAC) 300 MG tablet, Take 300 mg by mouth as needed. , Disp: , Rfl:  albuterol (PROVENTIL HFA;VENTOLIN HFA) 108 (90 BASE) MCG/ACT inhaler, Inhale 2 puffs into the lungs every 4 (four) hours as needed for wheezing., Disp: 1 Inhaler, Rfl: 0;  benzonatate (TESSALON) 100 MG capsule, Take 1 capsule (100 mg total) by mouth 2 (two) times daily as needed for cough., Disp: 20 capsule, Rfl: 0;  cetirizine (ZYRTEC) 10 MG tablet, Take 1 tablet (10 mg total) by mouth daily. One tab daily for allergies, Disp: 30 tablet, Rfl: 1 Cholecalciferol (VITAMIN D-3 PO), Take 1 tablet by mouth daily.  , Disp: , Rfl: ;  fluticasone (FLONASE) 50 MCG/ACT nasal spray, Place 1 spray into the nose 2 (two) times daily., Disp: 1 g, Rfl: 2;  fluticasone (FLONASE) 50 MCG/ACT nasal spray, Place 2 sprays into the nose daily.,  Disp: 16 g, Rfl: 0;  Melatonin 3 MG CAPS, Take by mouth 1 dose over 24 hours.  , Disp: , Rfl:  mometasone (ELOCON) 0.1 % cream, Apply topically daily., Disp: 45 g, Rfl: 0;  Probiotic Product (SOLUBLE FIBER/PROBIOTICS PO), Take 1 tablet by mouth daily.  , Disp: , Rfl:   EXAM:  Filed Vitals:   04/02/12 1020  BP: 120/74  Pulse: 106  Temp: 98.7 F (37.1 C)    There is no height on file to calculate BMI.  GENERAL: vitals reviewed and listed above, alert, oriented, appears well hydrated and in no acute distress  HEENT: atraumatic, conjunttiva clear, no obvious abnormalities on inspection of external nose and ears, ear canals normal, TMs normal, clear rhinorrhea, PND, mild post oropharyngeal erythema  NECK: no obvious masses on inspection, No LAD  LUNGS: clear to auscultation bilaterally, no  wheezes, rales or rhonchi, good air movement  CV: HRRR, no peripheral edema  MS: moves all extremities without noticeable abnormality  PSYCH: pleasant and cooperative, no obvious depression or anxiety  ASSESSMENT AND PLAN:  Discussed the following assessment and plan:  1. Viral upper respiratory illness  benzonatate (TESSALON) 100 MG capsule, fluticasone (FLONASE) 50 MCG/ACT nasal spray  -discussed bactrim allergy as alert for flonase with this - pt reports made her feel sick and dehydration - no rash, swelling breathing problems and doubt would have an issue with flonase. Pt will decide whether or not she wants to try it.  -Patient advised to return to notify a doctor immediately if symptoms worsen or persist or new concerns arise.  Patient Instructions  INSTRUCTIONS FOR UPPER RESPIRATORY INFECTION:  -plenty of rest and fluids  -nasal saline (use prepackaged nasal saline or bottled/distilled water if making your own)  -clean nose with nasal saline before using the nasal steroid or sinex  -can use sinex nasal spray for drainage and nasal congestion - but do NOT use longer then 3-4 days  -can use tylenol or ibuprofen as directed for aches and sorethroat  -if you are taking a cough medication - use only as directed  -follow up if fevers, worsening or not better in in 7 days       Dago Jungwirth R.

## 2012-04-19 ENCOUNTER — Other Ambulatory Visit: Payer: Self-pay | Admitting: Internal Medicine

## 2012-05-13 ENCOUNTER — Ambulatory Visit: Payer: BC Managed Care – PPO | Admitting: Family

## 2012-05-20 ENCOUNTER — Ambulatory Visit (INDEPENDENT_AMBULATORY_CARE_PROVIDER_SITE_OTHER): Payer: Medicare Other | Admitting: Family

## 2012-05-20 ENCOUNTER — Encounter: Payer: Self-pay | Admitting: Family

## 2012-05-20 VITALS — BP 118/82 | HR 83 | Temp 99.0°F | Wt 216.0 lb

## 2012-05-20 DIAGNOSIS — J069 Acute upper respiratory infection, unspecified: Secondary | ICD-10-CM

## 2012-05-20 DIAGNOSIS — F17200 Nicotine dependence, unspecified, uncomplicated: Secondary | ICD-10-CM

## 2012-05-20 DIAGNOSIS — Z720 Tobacco use: Secondary | ICD-10-CM

## 2012-05-20 MED ORDER — PREDNISONE 20 MG PO TABS: ORAL_TABLET | ORAL | Status: DC

## 2012-05-20 NOTE — Patient Instructions (Addendum)
Smoking Cessation Quitting smoking is important to your health and has many advantages. However, it is not always easy to quit since nicotine is a very addictive drug. Often times, people try 3 times or more before being able to quit. This document explains the best ways for you to prepare to quit smoking. Quitting takes hard work and a lot of effort, but you can do it. ADVANTAGES OF QUITTING SMOKING  You will live longer, feel better, and live better.  Your body will feel the impact of quitting smoking almost immediately.  Within 20 minutes, blood pressure decreases. Your pulse returns to its normal level.  After 8 hours, carbon monoxide levels in the blood return to normal. Your oxygen level increases.  After 24 hours, the chance of having a heart attack starts to decrease. Your breath, hair, and body stop smelling like smoke.  After 48 hours, damaged nerve endings begin to recover. Your sense of taste and smell improve.  After 72 hours, the body is virtually free of nicotine. Your bronchial tubes relax and breathing becomes easier.  After 2 to 12 weeks, lungs can hold more air. Exercise becomes easier and circulation improves.  The risk of having a heart attack, stroke, cancer, or lung disease is greatly reduced.  After 1 year, the risk of coronary heart disease is cut in half.  After 5 years, the risk of stroke falls to the same as a nonsmoker.  After 10 years, the risk of lung cancer is cut in half and the risk of other cancers decreases significantly.  After 15 years, the risk of coronary heart disease drops, usually to the level of a nonsmoker.  If you are pregnant, quitting smoking will improve your chances of having a healthy baby.  The people you live with, especially any children, will be healthier.  You will have extra money to spend on things other than cigarettes. QUESTIONS TO THINK ABOUT BEFORE ATTEMPTING TO QUIT You may want to talk about your answers with your  caregiver.  Why do you want to quit?  If you tried to quit in the past, what helped and what did not?  What will be the most difficult situations for you after you quit? How will you plan to handle them?  Who can help you through the tough times? Your family? Friends? A caregiver?  What pleasures do you get from smoking? What ways can you still get pleasure if you quit? Here are some questions to ask your caregiver:  How can you help me to be successful at quitting?  What medicine do you think would be best for me and how should I take it?  What should I do if I need more help?  What is smoking withdrawal like? How can I get information on withdrawal? GET READY  Set a quit date.  Change your environment by getting rid of all cigarettes, ashtrays, matches, and lighters in your home, car, or work. Do not let people smoke in your home.  Review your past attempts to quit. Think about what worked and what did not. GET SUPPORT AND ENCOURAGEMENT You have a better chance of being successful if you have help. You can get support in many ways.  Tell your family, friends, and co-workers that you are going to quit and need their support. Ask them not to smoke around you.  Get individual, group, or telephone counseling and support. Programs are available at local hospitals and health centers. Call your local health department for   information about programs in your area.  Spiritual beliefs and practices may help some smokers quit.  Download a "quit meter" on your computer to keep track of quit statistics, such as how long you have gone without smoking, cigarettes not smoked, and money saved.  Get a self-help book about quitting smoking and staying off of tobacco. LEARN NEW SKILLS AND BEHAVIORS  Distract yourself from urges to smoke. Talk to someone, go for a walk, or occupy your time with a task.  Change your normal routine. Take a different route to work. Drink tea instead of coffee.  Eat breakfast in a different place.  Reduce your stress. Take a hot bath, exercise, or read a book.  Plan something enjoyable to do every day. Reward yourself for not smoking.  Explore interactive web-based programs that specialize in helping you quit. GET MEDICINE AND USE IT CORRECTLY Medicines can help you stop smoking and decrease the urge to smoke. Combining medicine with the above behavioral methods and support can greatly increase your chances of successfully quitting smoking.  Nicotine replacement therapy helps deliver nicotine to your body without the negative effects and risks of smoking. Nicotine replacement therapy includes nicotine gum, lozenges, inhalers, nasal sprays, and skin patches. Some may be available over-the-counter and others require a prescription.  Antidepressant medicine helps people abstain from smoking, but how this works is unknown. This medicine is available by prescription.  Nicotinic receptor partial agonist medicine simulates the effect of nicotine in your brain. This medicine is available by prescription. Ask your caregiver for advice about which medicines to use and how to use them based on your health history. Your caregiver will tell you what side effects to look out for if you choose to be on a medicine or therapy. Carefully read the information on the package. Do not use any other product containing nicotine while using a nicotine replacement product.  RELAPSE OR DIFFICULT SITUATIONS Most relapses occur within the first 3 months after quitting. Do not be discouraged if you start smoking again. Remember, most people try several times before finally quitting. You may have symptoms of withdrawal because your body is used to nicotine. You may crave cigarettes, be irritable, feel very hungry, cough often, get headaches, or have difficulty concentrating. The withdrawal symptoms are only temporary. They are strongest when you first quit, but they will go away within  10 14 days. To reduce the chances of relapse, try to:  Avoid drinking alcohol. Drinking lowers your chances of successfully quitting.  Reduce the amount of caffeine you consume. Once you quit smoking, the amount of caffeine in your body increases and can give you symptoms, such as a rapid heartbeat, sweating, and anxiety.  Avoid smokers because they can make you want to smoke.  Do not let weight gain distract you. Many smokers will gain weight when they quit, usually less than 10 pounds. Eat a healthy diet and stay active. You can always lose the weight gained after you quit.  Find ways to improve your mood other than smoking. FOR MORE INFORMATION  www.smokefree.gov  Document Released: 06/06/2001 Document Revised: 12/12/2011 Document Reviewed: 09/21/2011 [facility] Patient Information 2013 [place], [place].   Upper Respiratory Infection, Adult An upper respiratory infection (URI) is also known as the common cold. It is often caused by a type of germ (virus). Colds are easily spread (contagious). You can pass it to others by kissing, coughing, sneezing, or drinking out of the same glass. Usually, you get better in 1 or 2  weeks.  HOME CARE   Only take medicine as told by your doctor.  Use a warm mist humidifier or breathe in steam from a hot shower.  Drink enough water and fluids to keep your pee (urine) clear or pale yellow.  Get plenty of rest.  Return to work when your temperature is back to normal or as told by your doctor. You may use a face mask and wash your hands to stop your cold from spreading. GET HELP RIGHT AWAY IF:   After the first few days, you feel you are getting worse.  You have questions about your medicine.  You have chills, shortness of breath, or [place] or red spit (mucus).  You have yellow or [place] snot (nasal discharge) or pain in the face, especially when you bend forward.  You have a fever, puffy (swollen) neck, pain when you swallow, or white spots in the  back of your throat.  You have a bad headache, ear pain, sinus pain, or chest pain.  You have a high-pitched whistling sound when you breathe in and out (wheezing).  You have a lasting cough or cough up blood.  You have sore muscles or a stiff neck. MAKE SURE YOU:   Understand these instructions.  Will watch your condition.  Will get help right away if you are not doing well or get worse. Document Released: 11/29/2007 Document Revised: 09/04/2011 Document Reviewed: 10/17/2010 [facility] Patient Information 2013 [place], [place].

## 2012-05-20 NOTE — Progress Notes (Signed)
Subjective:    Patient ID: [name], female    DOB: December 09, 1970, 41 y.o.   MRN: [id number]  HPI  41 year old white female, one pack per day smoker, is in with complaints of worsening cough, congestion, sore throat and drainage x3 days. His intake and Benadryl over-the-counter with no relief.   Review of Systems  Constitutional: Negative.   HENT: Positive for congestion, sneezing and postnasal drip.   Respiratory: Positive for cough and shortness of breath. Negative for wheezing.   Cardiovascular: Negative.   Skin: Negative.   Hematological: Negative.   Psychiatric/Behavioral: Negative.    Past Medical History  Diagnosis Date  . Obesity   . Dyslipidemia   . Diabetes mellitus   . Hyperlipidemia   . Obesity   . Tobacco use disorder   . Anxiety     Dr. Donnie Aho, [facility]  . Depression     Clinical psychologist, Erlinda Hong  . ADD (attention deficit disorder)   . Cystocele     sees gyn, Dr. Reinaldo Meeker  . Rectocele   . PTSD (post-traumatic stress disorder)     History   Social History  . Marital Status: Married    Spouse Name: N/A    Number of Children: 2  . Years of Education: N/A   Occupational History  . Not on file.   Social History Main Topics  . Smoking status: Current Every Day Smoker -- 1.0 packs/day for 7 years    Last Attempt to Quit: 09/13/2011  . Smokeless tobacco: Former Neurosurgeon    Quit date: 08/11/2011  . Alcohol Use: No  . Drug Use: No  . Sexually Active: Not on file     Comment: trying to get disability; lives with husband; has children, but mother has custody   Other Topics Concern  . Not on file   Social History Narrative  . No narrative on file    Past Surgical History  Procedure Date  . Tubal ligation   . Cholecystectomy   . Diagnostic mammogram     never; sees Dr. Reinaldo Meeker  . Abdominal hysterectomy   . Incontinence surgery June 2012  . Bladder suspension   . Rectocele repair   . Cystocele repair     Family  History  Problem Relation Age of Onset  . Depression Mother   . Hyperlipidemia Mother   . Hypertension Mother   . Diabetes Mother   . Hyperlipidemia Father   . Depression Father   . Nephrolithiasis Brother     Allergies  Allergen Reactions  . Bactrim   . Codeine     vomiting  . Macrobid (Nitrofurantoin Monohydrate Macrocrystals)   . Nitrofurantoin   . Sulfa Antibiotics     Current Outpatient Prescriptions on File Prior to Visit  Medication Sig Dispense Refill  . albuterol (PROVENTIL HFA;VENTOLIN HFA) 108 (90 BASE) MCG/ACT inhaler Inhale 2 puffs into the lungs every 4 (four) hours as needed for wheezing.  1 Inhaler  0  . busPIRone (BUSPAR) 10 MG tablet Take 5 mg by mouth 2 (two) times daily.       . Cholecalciferol (VITAMIN D-3 PO) Take 1 tablet by mouth daily.        . citalopram (CELEXA) 40 MG tablet Take 20 mg by mouth 2 (two) times daily.       . clonazePAM (KLONOPIN) 2 MG tablet Take 0.5 mg by mouth 3 (three) times daily as needed.       . diclofenac (VOLTAREN) 75 MG EC  tablet Take 1 tablet (75 mg total) by mouth 2 (two) times daily.  30 tablet  2  . glucose blood (FREESTYLE LITE) test strip Use to check glucose once daily  32 each  6  . Lancets (FREESTYLE) lancets Use to check glucose once daily  100 each  2  . Melatonin 3 MG CAPS Take by mouth 1 dose over 24 hours.        . mometasone (ELOCON) 0.1 % cream Apply topically daily.  45 g  0  . pravastatin (PRAVACHOL) 40 MG tablet Take 1 tablet (40 mg total) by mouth daily.  30 tablet  0  . pravastatin (PRAVACHOL) 40 MG tablet TAKE ONE TABLET BY MOUTH EVERY DAY  30 tablet  3  . Probiotic Product (SOLUBLE FIBER/PROBIOTICS PO) Take 1 tablet by mouth daily.        . ranitidine (ZANTAC) 300 MG tablet Take 300 mg by mouth as needed.       . benzonatate (TESSALON) 100 MG capsule Take 1 capsule (100 mg total) by mouth 2 (two) times daily as needed for cough.  20 capsule  0  . cetirizine (ZYRTEC) 10 MG tablet Take 1 tablet (10 mg  total) by mouth daily. One tab daily for allergies  30 tablet  1  . fluticasone (FLONASE) 50 MCG/ACT nasal spray Place 1 spray into the nose 2 (two) times daily.  1 g  2  . fluticasone (FLONASE) 50 MCG/ACT nasal spray Place 2 sprays into the nose daily.  16 g  0    BP 118/82  Pulse 83  Temp 99 F (37.2 C) (Oral)  Wt 216 lb (97.977 kg)  SpO2 93%  LMP 05/19/2012chart    Objective:   Physical Exam  Constitutional: She is oriented to person, place, and time. She appears well-developed and well-nourished.  HENT:  Right Ear: External ear normal.  Left Ear: External ear normal.  Nose: Nose normal.  Mouth/Throat: Oropharynx is clear and moist.  Neck: Normal range of motion. Neck supple.  Cardiovascular: Normal rate, regular rhythm and normal heart sounds.   Pulmonary/Chest: Effort normal and breath sounds normal. She has no wheezes.  Neurological: She is alert and oriented to person, place, and time.  Skin: Skin is warm and dry.  Psychiatric: She has a normal mood and affect.          Assessment & Plan:  Assessment: Upper respiratory infection, tobacco abuse  Plan: Over-the-counter symptomatic treatment for relief. Prednisone 60x3, 40x3, 20x3. Patient call the office if symptoms worsen or persist. Smoking cessation suggested. Recheck a schedule, and when necessary.

## 2012-05-21 ENCOUNTER — Other Ambulatory Visit: Payer: Self-pay | Admitting: Family

## 2012-05-21 DIAGNOSIS — Z1231 Encounter for screening mammogram for malignant neoplasm of breast: Secondary | ICD-10-CM

## 2012-06-17 ENCOUNTER — Ambulatory Visit (INDEPENDENT_AMBULATORY_CARE_PROVIDER_SITE_OTHER): Payer: Medicare Other | Admitting: Family

## 2012-06-17 ENCOUNTER — Encounter: Payer: Self-pay | Admitting: Family

## 2012-06-17 VITALS — BP 112/80 | Temp 99.1°F | Wt 219.0 lb

## 2012-06-17 DIAGNOSIS — E669 Obesity, unspecified: Secondary | ICD-10-CM

## 2012-06-17 DIAGNOSIS — J019 Acute sinusitis, unspecified: Secondary | ICD-10-CM

## 2012-06-17 DIAGNOSIS — E119 Type 2 diabetes mellitus without complications: Secondary | ICD-10-CM

## 2012-06-17 MED ORDER — AMOXICILLIN 500 MG PO TABS: 1000.0000 mg | ORAL_TABLET | Freq: Two times a day (BID) | ORAL | Status: AC

## 2012-06-17 NOTE — Progress Notes (Signed)
Subjective:    Patient ID: [name], female    DOB: 1970/08/05, 41 y.o.   MRN: [id number]  HPI 41 year old white female, smoker, is in with worsening cough, congestion, postnasal drip and sinus pressure. She was seen last week and started on a Medrol Dosepak that has been ineffective. She's also noticed blood sugars in the 300s. She's had low-grade fever.  Patient has concerns of increased weight. She has a history of type 2 diabetes. She's requesting a referral to the bariatric clinic.   Review of Systems  Constitutional: Negative.   HENT: Positive for sore throat, rhinorrhea, sneezing, postnasal drip and sinus pressure.   Respiratory: Negative.   Cardiovascular: Negative.   Skin: Negative.   Neurological: Negative.   Psychiatric/Behavioral: Negative.    Past Medical History  Diagnosis Date  . Obesity   . Dyslipidemia   . Diabetes mellitus   . Hyperlipidemia   . Obesity   . Tobacco use disorder   . Anxiety     Dr. Donnie Aho, [facility]  . Depression     Clinical psychologist, Erlinda Hong  . ADD (attention deficit disorder)   . Cystocele     sees gyn, Dr. Reinaldo Meeker  . Rectocele   . PTSD (post-traumatic stress disorder)     History   Social History  . Marital Status: Married    Spouse Name: N/A    Number of Children: 2  . Years of Education: N/A   Occupational History  . Not on file.   Social History Main Topics  . Smoking status: Current Every Day Smoker -- 1.0 packs/day for 7 years    Last Attempt to Quit: 09/13/2011  . Smokeless tobacco: Former Neurosurgeon    Quit date: 08/11/2011  . Alcohol Use: No  . Drug Use: No  . Sexually Active: Not on file     Comment: trying to get disability; lives with husband; has children, but mother has custody   Other Topics Concern  . Not on file   Social History Narrative  . No narrative on file    Past Surgical History  Procedure Date  . Tubal ligation   . Cholecystectomy   . Diagnostic mammogram      never; sees Dr. Reinaldo Meeker  . Abdominal hysterectomy   . Incontinence surgery June 2012  . Bladder suspension   . Rectocele repair   . Cystocele repair     Family History  Problem Relation Age of Onset  . Depression Mother   . Hyperlipidemia Mother   . Hypertension Mother   . Diabetes Mother   . Hyperlipidemia Father   . Depression Father   . Nephrolithiasis Brother     Allergies  Allergen Reactions  . Bactrim   . Codeine     vomiting  . Macrobid (Nitrofurantoin Monohydrate Macrocrystals)   . Nitrofurantoin   . Sulfa Antibiotics     Current Outpatient Prescriptions on File Prior to Visit  Medication Sig Dispense Refill  . albuterol (PROVENTIL HFA;VENTOLIN HFA) 108 (90 BASE) MCG/ACT inhaler Inhale 2 puffs into the lungs every 4 (four) hours as needed for wheezing.  1 Inhaler  0  . benzonatate (TESSALON) 100 MG capsule Take 1 capsule (100 mg total) by mouth 2 (two) times daily as needed for cough.  20 capsule  0  . busPIRone (BUSPAR) 10 MG tablet Take 5 mg by mouth 2 (two) times daily.       . cetirizine (ZYRTEC) 10 MG tablet Take 1 tablet (10 mg  total) by mouth daily. One tab daily for allergies  30 tablet  1  . Cholecalciferol (VITAMIN D-3 PO) Take 1 tablet by mouth daily.        . citalopram (CELEXA) 40 MG tablet Take 20 mg by mouth 2 (two) times daily.       . clonazePAM (KLONOPIN) 2 MG tablet Take 0.5 mg by mouth 3 (three) times daily as needed.       . diclofenac (VOLTAREN) 75 MG EC tablet Take 1 tablet (75 mg total) by mouth 2 (two) times daily.  30 tablet  2  . fluticasone (FLONASE) 50 MCG/ACT nasal spray Place 1 spray into the nose 2 (two) times daily.  1 g  2  . fluticasone (FLONASE) 50 MCG/ACT nasal spray Place 2 sprays into the nose daily.  16 g  0  . glucose blood (FREESTYLE LITE) test strip Use to check glucose once daily  32 each  6  . Lancets (FREESTYLE) lancets Use to check glucose once daily  100 each  2  . Melatonin 3 MG CAPS Take by mouth 1 dose over 24  hours.        . mometasone (ELOCON) 0.1 % cream Apply topically daily.  45 g  0  . pravastatin (PRAVACHOL) 40 MG tablet Take 1 tablet (40 mg total) by mouth daily.  30 tablet  0  . pravastatin (PRAVACHOL) 40 MG tablet TAKE ONE TABLET BY MOUTH EVERY DAY  30 tablet  3  . Probiotic Product (SOLUBLE FIBER/PROBIOTICS PO) Take 1 tablet by mouth daily.        . ranitidine (ZANTAC) 300 MG tablet Take 300 mg by mouth as needed.       . predniSONE (DELTASONE) 20 MG tablet 60mg  PO qam x 3 days, 40mg  po qam x 3 days, 20mg  qam x 3 days  18 tablet  0    BP 112/80  Temp 99.1 F (37.3 C) (Oral)  Wt 219 lb (99.338 kg)  LMP 05/19/2012chart    Objective:   Physical Exam  Constitutional: She is oriented to person, place, and time. She appears well-developed and well-nourished.  HENT:  Right Ear: External ear normal.  Left Ear: External ear normal.  Nose: Nose normal.  Mouth/Throat: Oropharynx is clear and moist.  Neck: Normal range of motion. Neck supple.  Cardiovascular: Normal rate, regular rhythm and normal heart sounds.   Pulmonary/Chest: Effort normal and breath sounds normal.  Abdominal: Soft. Bowel sounds are normal.  Neurological: She is alert and oriented to person, place, and time.  Skin: Skin is warm and dry.  Psychiatric: She has a normal mood and affect.          Assessment & Plan:  Assessment: Acute sinusitis, Cough, Obesity, Type 2 diabetes  Plan: Amoxicillin 500 mg 2 capsules by mouth twice a day x10 days. Patient will like a referral to the bariatric clinic. BMI today is 39. Call the office if symptoms worsen or persist. Recheck as scheduled and sooner as needed.

## 2012-07-03 ENCOUNTER — Ambulatory Visit: Payer: Medicare Other | Admitting: Psychiatry

## 2012-07-03 ENCOUNTER — Telehealth: Payer: Self-pay | Admitting: Family

## 2012-07-03 DIAGNOSIS — R059 Cough, unspecified: Secondary | ICD-10-CM

## 2012-07-03 DIAGNOSIS — R05 Cough: Secondary | ICD-10-CM

## 2012-07-03 NOTE — Telephone Encounter (Signed)
Have a CXR done tomorrow.

## 2012-07-03 NOTE — Telephone Encounter (Signed)
Pt feels like she has gotten a worse cough. Pt wants to know if she should continue the antibiotics, or should she try to come back in. Pt was told to call if not better and perhaps get something else called in for her.  Phone [phone] Pharm: [facility]/ [place]

## 2012-07-03 NOTE — Telephone Encounter (Signed)
Please advise 

## 2012-07-03 NOTE — Telephone Encounter (Signed)
Pt will have xray done in the morning

## 2012-07-04 ENCOUNTER — Ambulatory Visit: Payer: Medicare Other

## 2012-07-04 ENCOUNTER — Ambulatory Visit: Payer: Medicare Other | Admitting: Psychiatry

## 2012-07-04 ENCOUNTER — Telehealth: Payer: Self-pay | Admitting: Family

## 2012-07-04 ENCOUNTER — Ambulatory Visit (INDEPENDENT_AMBULATORY_CARE_PROVIDER_SITE_OTHER)
Admission: RE | Admit: 2012-07-04 | Discharge: 2012-07-04 | Disposition: A | Discharge status: Home / Self Care | Payer: Medicare Other | Source: Ambulatory Visit | Attending: Family | Admitting: Family

## 2012-07-04 DIAGNOSIS — R05 Cough: Secondary | ICD-10-CM

## 2012-07-04 DIAGNOSIS — R059 Cough, unspecified: Secondary | ICD-10-CM

## 2012-07-04 NOTE — Telephone Encounter (Signed)
Pt would like results of Xray done this am. Pt would like call today either way if possible.

## 2012-07-04 NOTE — Telephone Encounter (Signed)
See results note. 

## 2012-07-09 ENCOUNTER — Ambulatory Visit (INDEPENDENT_AMBULATORY_CARE_PROVIDER_SITE_OTHER): Payer: Medicare Other | Admitting: Family

## 2012-07-09 ENCOUNTER — Encounter: Payer: Self-pay | Admitting: Family

## 2012-07-09 VITALS — BP 126/88 | HR 81 | Temp 98.4°F | Wt 219.0 lb

## 2012-07-09 DIAGNOSIS — R739 Hyperglycemia, unspecified: Secondary | ICD-10-CM

## 2012-07-09 DIAGNOSIS — F411 Generalized anxiety disorder: Secondary | ICD-10-CM

## 2012-07-09 DIAGNOSIS — M1990 Unspecified osteoarthritis, unspecified site: Secondary | ICD-10-CM

## 2012-07-09 DIAGNOSIS — F419 Anxiety disorder, unspecified: Secondary | ICD-10-CM

## 2012-07-09 DIAGNOSIS — E7800 Pure hypercholesterolemia, unspecified: Secondary | ICD-10-CM

## 2012-07-09 DIAGNOSIS — K219 Gastro-esophageal reflux disease without esophagitis: Secondary | ICD-10-CM

## 2012-07-09 DIAGNOSIS — R7309 Other abnormal glucose: Secondary | ICD-10-CM

## 2012-07-09 LAB — CBC WITH DIFFERENTIAL/PLATELET
Basophils Relative: 0.7 % (ref 0.0–3.0)
Eosinophils Relative: 4.4 % (ref 0.0–5.0)
Hemoglobin: 13.7 g/dL (ref 12.0–15.0)
Lymphocytes Relative: 29.1 % (ref 12.0–46.0)
MCHC: 33.4 g/dL (ref 30.0–36.0)
MCV: 91.4 fl (ref 78.0–100.0)
Monocytes Absolute: 0.7 10*3/uL (ref 0.1–1.0)
Neutro Abs: 4.9 10*3/uL (ref 1.4–7.7)
Neutrophils Relative %: 57.4 % (ref 43.0–77.0)
RBC: 4.49 Mil/uL (ref 3.87–5.11)
WBC: 8.6 10*3/uL (ref 4.5–10.5)

## 2012-07-09 LAB — HEPATIC FUNCTION PANEL
ALT: 21 U/L (ref 0–35)
Bilirubin, Direct: 0 mg/dL (ref 0.0–0.3)
Total Bilirubin: 0.5 mg/dL (ref 0.3–1.2)

## 2012-07-09 LAB — BASIC METABOLIC PANEL
Calcium: 9.2 mg/dL (ref 8.4–10.5)
Chloride: 103 mEq/L (ref 96–112)
Creatinine, Ser: 0.7 mg/dL (ref 0.4–1.2)

## 2012-07-09 NOTE — Patient Instructions (Addendum)
Smoking Cessation Quitting smoking is important to your health and has many advantages. However, it is not always easy to quit since nicotine is a very addictive drug. Often times, people try 3 times or more before being able to quit. This document explains the best ways for you to prepare to quit smoking. Quitting takes hard work and a lot of effort, but you can do it. ADVANTAGES OF QUITTING SMOKING  You will live longer, feel better, and live better.  Your body will feel the impact of quitting smoking almost immediately.  Within 20 minutes, blood pressure decreases. Your pulse returns to its normal level.  After 8 hours, carbon monoxide levels in the blood return to normal. Your oxygen level increases.  After 24 hours, the chance of having a heart attack starts to decrease. Your breath, hair, and body stop smelling like smoke.  After 48 hours, damaged nerve endings begin to recover. Your sense of taste and smell improve.  After 72 hours, the body is virtually free of nicotine. Your bronchial tubes relax and breathing becomes easier.  After 2 to 12 weeks, lungs can hold more air. Exercise becomes easier and circulation improves.  The risk of having a heart attack, stroke, cancer, or lung disease is greatly reduced.  After 1 year, the risk of coronary heart disease is cut in half.  After 5 years, the risk of stroke falls to the same as a nonsmoker.  After 10 years, the risk of lung cancer is cut in half and the risk of other cancers decreases significantly.  After 15 years, the risk of coronary heart disease drops, usually to the level of a nonsmoker.  If you are pregnant, quitting smoking will improve your chances of having a healthy baby.  The people you live with, especially any children, will be healthier.  You will have extra money to spend on things other than cigarettes. QUESTIONS TO THINK ABOUT BEFORE ATTEMPTING TO QUIT You may want to talk about your answers with your  caregiver.  Why do you want to quit?  If you tried to quit in the past, what helped and what did not?  What will be the most difficult situations for you after you quit? How will you plan to handle them?  Who can help you through the tough times? Your family? Friends? A caregiver?  What pleasures do you get from smoking? What ways can you still get pleasure if you quit? Here are some questions to ask your caregiver:  How can you help me to be successful at quitting?  What medicine do you think would be best for me and how should I take it?  What should I do if I need more help?  What is smoking withdrawal like? How can I get information on withdrawal? GET READY  Set a quit date.  Change your environment by getting rid of all cigarettes, ashtrays, matches, and lighters in your home, car, or work. Do not let people smoke in your home.  Review your past attempts to quit. Think about what worked and what did not. GET SUPPORT AND ENCOURAGEMENT You have a better chance of being successful if you have help. You can get support in many ways.  Tell your family, friends, and co-workers that you are going to quit and need their support. Ask them not to smoke around you.  Get individual, group, or telephone counseling and support. Programs are available at local hospitals and health centers. Call your local health department for   information about programs in your area.  Spiritual beliefs and practices may help some smokers quit.  Download a "quit meter" on your computer to keep track of quit statistics, such as how long you have gone without smoking, cigarettes not smoked, and money saved.  Get a self-help book about quitting smoking and staying off of tobacco. LEARN NEW SKILLS AND BEHAVIORS  Distract yourself from urges to smoke. Talk to someone, go for a walk, or occupy your time with a task.  Change your normal routine. Take a different route to work. Drink tea instead of coffee.  Eat breakfast in a different place.  Reduce your stress. Take a hot bath, exercise, or read a book.  Plan something enjoyable to do every day. Reward yourself for not smoking.  Explore interactive web-based programs that specialize in helping you quit. GET MEDICINE AND USE IT CORRECTLY Medicines can help you stop smoking and decrease the urge to smoke. Combining medicine with the above behavioral methods and support can greatly increase your chances of successfully quitting smoking.  Nicotine replacement therapy helps deliver nicotine to your body without the negative effects and risks of smoking. Nicotine replacement therapy includes nicotine gum, lozenges, inhalers, nasal sprays, and skin patches. Some may be available over-the-counter and others require a prescription.  Antidepressant medicine helps people abstain from smoking, but how this works is unknown. This medicine is available by prescription.  Nicotinic receptor partial agonist medicine simulates the effect of nicotine in your brain. This medicine is available by prescription. Ask your caregiver for advice about which medicines to use and how to use them based on your health history. Your caregiver will tell you what side effects to look out for if you choose to be on a medicine or therapy. Carefully read the information on the package. Do not use any other product containing nicotine while using a nicotine replacement product.  RELAPSE OR DIFFICULT SITUATIONS Most relapses occur within the first 3 months after quitting. Do not be discouraged if you start smoking again. Remember, most people try several times before finally quitting. You may have symptoms of withdrawal because your body is used to nicotine. You may crave cigarettes, be irritable, feel very hungry, cough often, get headaches, or have difficulty concentrating. The withdrawal symptoms are only temporary. They are strongest when you first quit, but they will go away within  10 14 days. To reduce the chances of relapse, try to:  Avoid drinking alcohol. Drinking lowers your chances of successfully quitting.  Reduce the amount of caffeine you consume. Once you quit smoking, the amount of caffeine in your body increases and can give you symptoms, such as a rapid heartbeat, sweating, and anxiety.  Avoid smokers because they can make you want to smoke.  Do not let weight gain distract you. Many smokers will gain weight when they quit, usually less than 10 pounds. Eat a healthy diet and stay active. You can always lose the weight gained after you quit.  Find ways to improve your mood other than smoking. FOR MORE INFORMATION  www.smokefree.gov  Document Released: 06/06/2001 Document Revised: 12/12/2011 Document Reviewed: 09/21/2011 [employer] Patient Information 2013 [employer].  

## 2012-07-09 NOTE — Progress Notes (Signed)
Subjective:    Patient ID: [name], female    DOB: July 30, 1970, 42 y.o.   MRN: [id number]  HPI 42 year old white female, half a pack per day smoker, is in for recheck of hyperlipidemia, hyperglycemia, GERD, anxiety, osteoarthritis, and allergic rhinitis. She's currently tolerating her medications well. Reports being more anxious recently. However, her psychiatrist has refused to adjust her medications any further at this point. Reports in addition to Klonopin in the past. She does not routinely exercise or follow any particular diet. Denies any feelings of helplessness, hopelessness, thoughts of death or dying.  Continues to have a persistent cough x1 month. Has mild wheezing and chest congestion. She's been on steroids and antibiotics in the past that have not helped. She continues to smoke and her husband smokes as well. Uses albuterol inhaler as needed.  Review of Systems  Constitutional: Negative.   HENT: Negative.   Eyes: Negative.   Respiratory: Negative.   Cardiovascular: Negative.   Gastrointestinal: Negative.   Genitourinary: Negative.   Musculoskeletal: Negative.   Skin: Negative.   Neurological: Negative.   Hematological: Negative.   Psychiatric/Behavioral: Negative.    Past Medical History  Diagnosis Date  . Obesity   . Dyslipidemia   . Diabetes mellitus   . Hyperlipidemia   . Obesity   . Tobacco use disorder   . Anxiety     Dr. Donnie Aho, [facility]  . Depression     Clinical psychologist, Erlinda Hong  . ADD (attention deficit disorder)   . Cystocele     sees gyn, Dr. Reinaldo Meeker  . Rectocele   . PTSD (post-traumatic stress disorder)     History   Social History  . Marital Status: Married    Spouse Name: N/A    Number of Children: 2  . Years of Education: N/A   Occupational History  . Not on file.   Social History Main Topics  . Smoking status: Current Every Day Smoker -- 1.0 packs/day for 7 years    Last Attempt to Quit:  09/13/2011  . Smokeless tobacco: Former Neurosurgeon    Quit date: 08/11/2011  . Alcohol Use: No  . Drug Use: No  . Sexually Active: Not on file     Comment: trying to get disability; lives with husband; has children, but mother has custody   Other Topics Concern  . Not on file   Social History Narrative  . No narrative on file    Past Surgical History  Procedure Date  . Tubal ligation   . Cholecystectomy   . Diagnostic mammogram     never; sees Dr. Reinaldo Meeker  . Abdominal hysterectomy   . Incontinence surgery June 2012  . Bladder suspension   . Rectocele repair   . Cystocele repair     Family History  Problem Relation Age of Onset  . Depression Mother   . Hyperlipidemia Mother   . Hypertension Mother   . Diabetes Mother   . Hyperlipidemia Father   . Depression Father   . Nephrolithiasis Brother     Allergies  Allergen Reactions  . Bactrim   . Codeine     vomiting  . Macrobid (Nitrofurantoin Monohydrate Macrocrystals)   . Nitrofurantoin   . Sulfa Antibiotics     Current Outpatient Prescriptions on File Prior to Visit  Medication Sig Dispense Refill  . albuterol (PROVENTIL HFA;VENTOLIN HFA) 108 (90 BASE) MCG/ACT inhaler Inhale 2 puffs into the lungs every 4 (four) hours as needed for wheezing.  1 Inhaler  0  . busPIRone (BUSPAR) 10 MG tablet Take 5 mg by mouth 2 (two) times daily.       . cetirizine (ZYRTEC) 10 MG tablet Take 1 tablet (10 mg total) by mouth daily. One tab daily for allergies  30 tablet  1  . Cholecalciferol (VITAMIN D-3 PO) Take 1 tablet by mouth daily.        . citalopram (CELEXA) 40 MG tablet Take 20 mg by mouth 2 (two) times daily.       . clonazePAM (KLONOPIN) 2 MG tablet Take 0.5 mg by mouth 3 (three) times daily as needed.       . fluticasone (FLONASE) 50 MCG/ACT nasal spray Place 1 spray into the nose 2 (two) times daily.  1 g  2  . fluticasone (FLONASE) 50 MCG/ACT nasal spray Place 2 sprays into the nose daily.  16 g  0  . glucose blood (FREESTYLE  LITE) test strip Use to check glucose once daily  32 each  6  . Lancets (FREESTYLE) lancets Use to check glucose once daily  100 each  2  . mometasone (ELOCON) 0.1 % cream Apply topically daily.  45 g  0  . pravastatin (PRAVACHOL) 40 MG tablet Take 1 tablet (40 mg total) by mouth daily.  30 tablet  0  . pravastatin (PRAVACHOL) 40 MG tablet TAKE ONE TABLET BY MOUTH EVERY DAY  30 tablet  3  . Probiotic Product (SOLUBLE FIBER/PROBIOTICS PO) Take 1 tablet by mouth daily.        . ranitidine (ZANTAC) 300 MG tablet Take 300 mg by mouth as needed.       . benzonatate (TESSALON) 100 MG capsule Take 1 capsule (100 mg total) by mouth 2 (two) times daily as needed for cough.  20 capsule  0  . diclofenac (VOLTAREN) 75 MG EC tablet Take 1 tablet (75 mg total) by mouth 2 (two) times daily.  30 tablet  2  . Melatonin 3 MG CAPS Take by mouth 1 dose over 24 hours.        . predniSONE (DELTASONE) 20 MG tablet 60mg  PO qam x 3 days, 40mg  po qam x 3 days, 20mg  qam x 3 days  18 tablet  0    BP 126/88  Pulse 81  Temp 98.4 F (36.9 C) (Oral)  Wt 219 lb (99.338 kg)  SpO2 97%  LMP 05/19/2012chart    Objective:   Physical Exam  Constitutional: She is oriented to person, place, and time. She appears well-developed and well-nourished.  HENT:  Right Ear: External ear normal.  Left Ear: External ear normal.  Nose: Nose normal.  Mouth/Throat: Oropharynx is clear and moist.  Neck: Normal range of motion. Neck supple.  Cardiovascular: Normal rate, regular rhythm and normal heart sounds.   Pulmonary/Chest: Effort normal and breath sounds normal.  Abdominal: Soft. Bowel sounds are normal.  Musculoskeletal: Normal range of motion.  Neurological: She is alert and oriented to person, place, and time.  Skin: Skin is warm and dry.  Psychiatric: She has a normal mood and affect.          Assessment & Plan:  Assessment: Hyperlipidemia, Hyperglycemia, GERD, Anxiety, Osteoarthritis, Allergic rhinitis tobacco abuse,  Persistent cough,   Plan: Continue current medications. Lab sent. Strongly encouraged smoking cessation. Symbicort 160/4.52 puffs twice a day x2 weeks. Albuterol as needed.

## 2012-07-10 ENCOUNTER — Telehealth: Payer: Self-pay | Admitting: Family

## 2012-07-10 ENCOUNTER — Ambulatory Visit: Payer: BC Managed Care – PPO | Admitting: Family

## 2012-07-10 NOTE — Telephone Encounter (Signed)
Patient would like to speak with the nurse

## 2012-07-10 NOTE — Telephone Encounter (Signed)
Pt c/o having a bad panic attack at 2:30 am having to call 911 to help calm her down. She states that her HR was in the 160's and that her anxiety meds do not work. Advised pt to schedule an appt with her psychiatrist for further evaluation. She states that she sees both psychology and psychiatry but she has a bad debt and can't pay. Advised pt on breathing technique to help with calming herself down. She verbalized understanding.  Pt would like to know if Padonda can suggest anything furhter. Please advise

## 2012-07-11 NOTE — Telephone Encounter (Signed)
Please call pt to schedule OV/30 min. Thanks!!

## 2012-07-11 NOTE — Telephone Encounter (Signed)
Needs OV.  

## 2012-07-11 NOTE — Telephone Encounter (Signed)
Pt is sch for 07-16-2012 11.15am

## 2012-07-16 ENCOUNTER — Encounter: Payer: Self-pay | Admitting: Family

## 2012-07-16 ENCOUNTER — Ambulatory Visit (INDEPENDENT_AMBULATORY_CARE_PROVIDER_SITE_OTHER): Payer: Medicare Other | Admitting: Family

## 2012-07-16 VITALS — BP 108/80 | HR 74 | Temp 98.7°F | Wt 219.0 lb

## 2012-07-16 DIAGNOSIS — F419 Anxiety disorder, unspecified: Secondary | ICD-10-CM

## 2012-07-16 DIAGNOSIS — F329 Major depressive disorder, single episode, unspecified: Secondary | ICD-10-CM

## 2012-07-16 DIAGNOSIS — F411 Generalized anxiety disorder: Secondary | ICD-10-CM

## 2012-07-16 DIAGNOSIS — F79 Unspecified intellectual disabilities: Secondary | ICD-10-CM | POA: Insufficient documentation

## 2012-07-16 DIAGNOSIS — F32A Depression, unspecified: Secondary | ICD-10-CM

## 2012-07-16 DIAGNOSIS — F3289 Other specified depressive episodes: Secondary | ICD-10-CM

## 2012-07-16 MED ORDER — CITALOPRAM HYDROBROMIDE 20 MG PO TABS: 20.0000 mg | ORAL_TABLET | Freq: Every day | ORAL | Status: DC

## 2012-07-16 MED ORDER — BUPROPION HCL ER (XL) 150 MG PO TB24: 150.0000 mg | ORAL_TABLET | Freq: Every day | ORAL | Status: DC
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
## 2012-07-16 NOTE — Patient Instructions (Addendum)
Anxiety and Panic Attacks Your caregiver has informed you that you are having an anxiety or panic attack. There may be many forms of this. Most of the time these attacks come suddenly and without warning. They come at any time of day, including periods of sleep, and at any time of life. They may be strong and unexplained. Although panic attacks are very scary, they are physically harmless. Sometimes the cause of your anxiety is not known. Anxiety is a protective mechanism of the body in its fight or flight mechanism. Most of these perceived danger situations are actually nonphysical situations (such as anxiety over losing a job). CAUSES  The causes of an anxiety or panic attack are many. Panic attacks may occur in otherwise healthy people given a certain set of circumstances. There may be a genetic cause for panic attacks. Some medications may also have anxiety as a side effect. SYMPTOMS  Some of the most common feelings are:  Intense terror.  Dizziness, feeling faint.  Hot and cold flashes.  Fear of going crazy.  Feelings that nothing is real.  Sweating.  Shaking.  Chest pain or a fast heartbeat (palpitations).  Smothering, choking sensations.  Feelings of impending doom and that death is near.  Tingling of extremities, this may be from over-breathing.  Altered reality (derealization).  Being detached from yourself (depersonalization). Several symptoms can be present to make up anxiety or panic attacks. DIAGNOSIS  The evaluation by your caregiver will depend on the type of symptoms you are experiencing. The diagnosis of anxiety or panic attack is made when no physical illness can be determined to be a cause of the symptoms. TREATMENT  Treatment to prevent anxiety and panic attacks may include:  Avoidance of circumstances that cause anxiety.  Reassurance and relaxation.  Regular exercise.  Relaxation therapies, such as yoga.  Psychotherapy with a psychiatrist or  therapist.  Avoidance of caffeine, alcohol and illegal drugs.  Prescribed medication. SEEK IMMEDIATE MEDICAL CARE IF:   You experience panic attack symptoms that are different than your usual symptoms.  You have any worsening or concerning symptoms. Document Released: 06/12/2005 Document Revised: 09/04/2011 Document Reviewed: 10/14/2009 [facility] Patient Information 2013 [place], [place].   National Anxiety/Mental Helpline 24 hours.  [phone]

## 2012-08-07 ENCOUNTER — Ambulatory Visit: Payer: Medicare Other | Admitting: Family

## 2012-08-12 ENCOUNTER — Telehealth: Payer: Self-pay | Admitting: Family

## 2012-08-12 NOTE — Telephone Encounter (Signed)
Pt would like you to call her. Pt is concerned she is going through menopause and it is affecting her blood sugars. Pt wanted to come in for a test to determine if she is going through menopause. Advised pt that MD had to order any test. Pt has appt Wed am.

## 2012-08-13 ENCOUNTER — Ambulatory Visit (INDEPENDENT_AMBULATORY_CARE_PROVIDER_SITE_OTHER): Payer: Medicare Other | Admitting: Family

## 2012-08-13 ENCOUNTER — Encounter: Payer: Self-pay | Admitting: Family

## 2012-08-13 VITALS — BP 100/76 | HR 86 | Wt 216.0 lb

## 2012-08-13 DIAGNOSIS — N951 Menopausal and female climacteric states: Secondary | ICD-10-CM

## 2012-08-13 DIAGNOSIS — F411 Generalized anxiety disorder: Secondary | ICD-10-CM

## 2012-08-13 LAB — LUTEINIZING HORMONE: LH: 5.52 m[IU]/mL

## 2012-08-13 NOTE — Patient Instructions (Addendum)

## 2012-08-13 NOTE — Progress Notes (Signed)
Subjective:    Patient ID: [name], female    DOB: [birth year], 42 y.o.   MRN: [id number]  HPI 42 year old WF, nonsmoker, is in today for a recheck of Anxiety. She has a 20+ year history of anxiety-uncontrolled. She was put on Wellbutrin and Celexa once a day. She then saw psychiatry that discontinued Wellbutrin and continued Celexa. She is now on Trazadone and has resumed seeing psychiatry. She continues to have increased anxiety, depression, and crying spells. She is due to see psychiatry again in April. However, she now has concerns of menopausal symptoms. Reports increased anxiety, palpitations, and night sweats. Patient has been on stable on meds for anxiety medication for quite some time. She has had a partial hysterectomy and has one ovary remaining. She believes she is going through menopause and would like to be checked today.   Review of Systems  Constitutional: Negative.   HENT: Negative.   Eyes: Negative.   Respiratory: Negative.   Cardiovascular: Positive for palpitations. Negative for chest pain and leg swelling.  Gastrointestinal: Negative.   Musculoskeletal: Negative.   Skin: Negative.   Neurological: Negative.   Hematological: Negative.   Psychiatric/Behavioral: Positive for sleep disturbance and agitation. Negative for suicidal ideas and self-injury. The patient is nervous/anxious and is hyperactive.    Past Medical History  Diagnosis Date  . Obesity   . Dyslipidemia   . Diabetes mellitus   . Hyperlipidemia   . Obesity   . Tobacco use disorder   . Anxiety     Dr. Donnie Aho, [facility]  . Depression     Clinical psychologist, Erlinda Hong  . ADD (attention deficit disorder)   . Cystocele     sees gyn, Dr. Reinaldo Meeker  . Rectocele   . PTSD (post-traumatic stress disorder)     History   Social History  . Marital Status: Married    Spouse Name: N/A    Number of Children: 2  . Years of Education: N/A   Occupational History  . Not on file.    Social History Main Topics  . Smoking status: Current Every Day Smoker -- 1.00 packs/day for 7 years    Last Attempt to Quit: 09/13/2011  . Smokeless tobacco: Former Neurosurgeon    Quit date: 08/11/2011  . Alcohol Use: No  . Drug Use: No  . Sexually Active: Not on file     Comment: trying to get disability; lives with husband; has children, but mother has custody   Other Topics Concern  . Not on file   Social History Narrative  . No narrative on file    Past Surgical History  Procedure Laterality Date  . Tubal ligation    . Cholecystectomy    . Diagnostic mammogram      never; sees Dr. Reinaldo Meeker  . Abdominal hysterectomy    . Incontinence surgery  June 2012  . Bladder suspension    . Rectocele repair    . Cystocele repair      Family History  Problem Relation Age of Onset  . Depression Mother   . Hyperlipidemia Mother   . Hypertension Mother   . Diabetes Mother   . Hyperlipidemia Father   . Depression Father   . Nephrolithiasis Brother     Allergies  Allergen Reactions  . Bactrim   . Codeine     vomiting  . Macrobid (Nitrofurantoin Monohydrate Macrocrystals)   . Nitrofurantoin   . Sulfa Antibiotics     Current Outpatient Prescriptions on File  Prior to Visit  Medication Sig Dispense Refill  . busPIRone (BUSPAR) 10 MG tablet Take 5 mg by mouth 2 (two) times daily.       . cetirizine (ZYRTEC) 10 MG tablet Take 1 tablet (10 mg total) by mouth daily. One tab daily for allergies  30 tablet  1  . Cholecalciferol (VITAMIN D-3 PO) Take 1 tablet by mouth daily.        . citalopram (CELEXA) 20 MG tablet Take 1 tablet (20 mg total) by mouth daily.  30 tablet  3  . clonazePAM (KLONOPIN) 2 MG tablet Take 0.5 mg by mouth 3 (three) times daily as needed.       . diclofenac (VOLTAREN) 75 MG EC tablet Take 1 tablet (75 mg total) by mouth 2 (two) times daily.  30 tablet  2  . fluticasone (FLONASE) 50 MCG/ACT nasal spray Place 1 spray into the nose 2 (two) times daily.  1 g  2  .  fluticasone (FLONASE) 50 MCG/ACT nasal spray Place 2 sprays into the nose daily.  16 g  0  . glucose blood (FREESTYLE LITE) test strip Use to check glucose once daily  32 each  6  . Lancets (FREESTYLE) lancets Use to check glucose once daily  100 each  2  . Melatonin 3 MG CAPS Take by mouth 1 dose over 24 hours.        . mometasone (ELOCON) 0.1 % cream Apply topically daily.  45 g  0  . pravastatin (PRAVACHOL) 40 MG tablet Take 1 tablet (40 mg total) by mouth daily.  30 tablet  0  . pravastatin (PRAVACHOL) 40 MG tablet TAKE ONE TABLET BY MOUTH EVERY DAY  30 tablet  3  . predniSONE (DELTASONE) 20 MG tablet 60mg  PO qam x 3 days, 40mg  po qam x 3 days, 20mg  qam x 3 days  18 tablet  0  . Probiotic Product (SOLUBLE FIBER/PROBIOTICS PO) Take 1 tablet by mouth daily.        . ranitidine (ZANTAC) 300 MG tablet Take 300 mg by mouth as needed.       Marland Kitchen albuterol (PROVENTIL HFA;VENTOLIN HFA) 108 (90 BASE) MCG/ACT inhaler Inhale 2 puffs into the lungs every 4 (four) hours as needed for wheezing.  1 Inhaler  0  . benzonatate (TESSALON) 100 MG capsule Take 1 capsule (100 mg total) by mouth 2 (two) times daily as needed for cough.  20 capsule  0  . buPROPion (WELLBUTRIN XL) 150 MG 24 hr tablet Take 1 tablet (150 mg total) by mouth daily.  30 tablet  1   No current facility-administered medications on file prior to visit.    BP 100/76  Pulse 86  Wt 216 lb (97.977 kg)  BMI 38.27 kg/m2  SpO2 97%  LMP 05/19/2012chart     Objective:   Physical Exam  Constitutional: She is oriented to person, place, and time. She appears well-developed and well-nourished.  HENT:  Right Ear: External ear normal.  Left Ear: External ear normal.  Nose: Nose normal.  Mouth/Throat: Oropharynx is clear and moist.  Neck: Normal range of motion. Neck supple. No thyromegaly present.  Cardiovascular: Regular rhythm and normal heart sounds.   Pulmonary/Chest: Effort normal and breath sounds normal.  Musculoskeletal: Normal range of  motion.  Neurological: She is alert and oriented to person, place, and time.  Skin: Skin is warm and dry.  Psychiatric: She has a normal mood and affect.  Assessment & Plan:  Assessment: 1. Generalized Anxiety Disorder 2. Menopausal Symptoms  Plan:  Continue to see psychiatry for posychiatric concerns. I have advised that 1 provider needs to be managing that aspect of her care, preferably psych. [facility] and [facility] sent although the bulk of her symtpoms appears to be anxiety related.

## 2012-08-13 NOTE — Telephone Encounter (Signed)
Pt has appt today

## 2012-08-14 ENCOUNTER — Telehealth: Payer: Self-pay | Admitting: Family

## 2012-08-14 ENCOUNTER — Ambulatory Visit: Payer: Self-pay | Admitting: Family

## 2012-08-14 NOTE — Telephone Encounter (Signed)
Pt would like results of labs done 08/13/12. (hormone levels)

## 2012-08-14 NOTE — Telephone Encounter (Signed)
See results note. 

## 2012-08-15 ENCOUNTER — Telehealth: Payer: Self-pay | Admitting: Family

## 2012-08-15 NOTE — Telephone Encounter (Signed)
Advised pt, per Padonda, menopause does not affect blood sugars. Pt must exercise, watch her intake of sugars and starches, stop stressing and QUIT SMOKING. Pt notes that she bought an exercise video and plans to start working out to that soon. She also verbalized understanding about everything else

## 2012-08-15 NOTE — Telephone Encounter (Signed)
Pt would like to know if menopause can effect your blood sugars? Pt has been having some issues w/ high blood sugars.Marland Kitchen

## 2012-09-04 ENCOUNTER — Telehealth: Payer: Self-pay | Admitting: Family

## 2012-09-04 NOTE — Telephone Encounter (Signed)
I will call her. She just had one in January. Thanks

## 2012-09-04 NOTE — Telephone Encounter (Signed)
Pt aware it is too early for her to have another a1c done

## 2012-09-04 NOTE — Telephone Encounter (Signed)
Pt would like to have A1C test. Can I sch?

## 2012-09-07 ENCOUNTER — Ambulatory Visit (INDEPENDENT_AMBULATORY_CARE_PROVIDER_SITE_OTHER): Payer: Medicare Other | Admitting: Family Medicine

## 2012-09-07 ENCOUNTER — Encounter: Payer: Self-pay | Admitting: Family Medicine

## 2012-09-07 VITALS — BP 110/70 | HR 78 | Temp 98.4°F | Wt 216.0 lb

## 2012-09-07 DIAGNOSIS — J019 Acute sinusitis, unspecified: Secondary | ICD-10-CM

## 2012-09-07 DIAGNOSIS — F410 Panic disorder [episodic paroxysmal anxiety] without agoraphobia: Secondary | ICD-10-CM

## 2012-09-07 DIAGNOSIS — F341 Dysthymic disorder: Secondary | ICD-10-CM

## 2012-09-07 MED ORDER — AMOXICILLIN 500 MG PO CAPS: 1000.0000 mg | ORAL_CAPSULE | Freq: Two times a day (BID) | ORAL | Status: DC

## 2012-09-07 NOTE — Patient Instructions (Signed)
AFRIN nasal spray: 2 sprays in each nostril twice a day BUT only for 3-4 days in a row.

## 2012-09-07 NOTE — Progress Notes (Signed)
Nature conservation officer at [facility] [address] [place] [address] Phone: [phone] Fax: [phone]  Date:  09/07/2012   Name:  [name]   DOB:  09/20/1970   MRN:  [id number] Gender: female Age: 42 y.o.  Primary Physician:  Janell Quiet, FNP  Evaluating MD: Hannah Beat, MD   Chief Complaint: Sinusitis and Panic Attack   History of Present Illness:  [name] is a 42 y.o. pleasant patient who presents with the following:  2 days of sinus pressure and pain. Feels like cannot breath through , and this is making her panic attacks much worse.  She has been trying some over-the-counter cough and cold medications with limited success. She is currently afebrile. She is only been sick for 2 days. She is eating and drinking normally. No nausea, vomiting, or diarrhea.  She also asked my opinion about the use of atypical antipsychotics in panic attacks and depression.  Patient Active Problem List  Diagnosis  . DIABETES MELLITUS  . HYPERCHOLESTEROLEMIA  . PANIC ATTACK  . ANXIETY DEPRESSION  . TOBACCO ABUSE  . ALLERGIC RHINITIS  . Multiple insect bites  . Mental retardation    Past Medical History  Diagnosis Date  . Obesity   . Dyslipidemia   . Diabetes mellitus   . Hyperlipidemia   . Obesity   . Tobacco use disorder   . Anxiety     Dr. Donnie Aho, [facility]  . Depression     Clinical psychologist, Erlinda Hong  . ADD (attention deficit disorder)   . Cystocele     sees gyn, Dr. Reinaldo Meeker  . Rectocele   . PTSD (post-traumatic stress disorder)     Past Surgical History  Procedure Laterality Date  . Tubal ligation    . Cholecystectomy    . Diagnostic mammogram      never; sees Dr. Reinaldo Meeker  . Abdominal hysterectomy    . Incontinence surgery  June 2012  . Bladder suspension    . Rectocele repair    . Cystocele repair      History   Social History  . Marital Status: Married    Spouse Name: N/A    Number of Children:  2  . Years of Education: N/A   Occupational History  . Not on file.   Social History Main Topics  . Smoking status: Current Every Day Smoker -- 1.00 packs/day for 7 years    Last Attempt to Quit: 09/13/2011  . Smokeless tobacco: Former Neurosurgeon    Quit date: 08/11/2011  . Alcohol Use: No  . Drug Use: No  . Sexually Active: Not on file     Comment: trying to get disability; lives with husband; has children, but mother has custody   Other Topics Concern  . Not on file   Social History Narrative  . No narrative on file    Family History  Problem Relation Age of Onset  . Depression Mother   . Hyperlipidemia Mother   . Hypertension Mother   . Diabetes Mother   . Hyperlipidemia Father   . Depression Father   . Nephrolithiasis Brother     Allergies  Allergen Reactions  . Bactrim   . Codeine     vomiting  . Macrobid (Nitrofurantoin Monohydrate Macrocrystals)   . Nitrofurantoin   . Sulfa Antibiotics     Medication list has been reviewed and updated.  Outpatient Prescriptions Prior to Visit  Medication Sig Dispense Refill  . Cholecalciferol (VITAMIN D-3 PO)  Take 1 tablet by mouth daily.        . citalopram (CELEXA) 20 MG tablet Take 1 tablet (20 mg total) by mouth daily.  30 tablet  3  . clonazePAM (KLONOPIN) 2 MG tablet Take 0.5 mg by mouth 3 (three) times daily as needed.       . fluticasone (FLONASE) 50 MCG/ACT nasal spray Place 2 sprays into the nose daily.  16 g  0  . glucose blood (FREESTYLE LITE) test strip Use to check glucose once daily  32 each  6  . Lancets (FREESTYLE) lancets Use to check glucose once daily  100 each  2  . pravastatin (PRAVACHOL) 40 MG tablet TAKE ONE TABLET BY MOUTH EVERY DAY  30 tablet  3  . albuterol (PROVENTIL HFA;VENTOLIN HFA) 108 (90 BASE) MCG/ACT inhaler Inhale 2 puffs into the lungs every 4 (four) hours as needed for wheezing.  1 Inhaler  0  . benzonatate (TESSALON) 100 MG capsule Take 1 capsule (100 mg total) by mouth 2 (two) times daily  as needed for cough.  20 capsule  0  . buPROPion (WELLBUTRIN XL) 150 MG 24 hr tablet Take 1 tablet (150 mg total) by mouth daily.  30 tablet  1  . busPIRone (BUSPAR) 10 MG tablet Take 5 mg by mouth 2 (two) times daily.       . cetirizine (ZYRTEC) 10 MG tablet Take 1 tablet (10 mg total) by mouth daily. One tab daily for allergies  30 tablet  1  . diclofenac (VOLTAREN) 75 MG EC tablet Take 1 tablet (75 mg total) by mouth 2 (two) times daily.  30 tablet  2  . fluticasone (FLONASE) 50 MCG/ACT nasal spray Place 1 spray into the nose 2 (two) times daily.  1 g  2  . Melatonin 3 MG CAPS Take by mouth 1 dose over 24 hours.        . mometasone (ELOCON) 0.1 % cream Apply topically daily.  45 g  0  . pravastatin (PRAVACHOL) 40 MG tablet Take 1 tablet (40 mg total) by mouth daily.  30 tablet  0  . predniSONE (DELTASONE) 20 MG tablet 60mg  PO qam x 3 days, 40mg  po qam x 3 days, 20mg  qam x 3 days  18 tablet  0  . Probiotic Product (SOLUBLE FIBER/PROBIOTICS PO) Take 1 tablet by mouth daily.        . ranitidine (ZANTAC) 300 MG tablet Take 300 mg by mouth as needed.       . traZODone (DESYREL) 50 MG tablet Take 50 mg by mouth at bedtime.        No facility-administered medications prior to visit.    Review of Systems:  ROS: GEN: Acute illness details above GI: Tolerating PO intake GU: maintaining adequate hydration and urination Pulm: No SOB Interactive and getting along well at home.  Otherwise, ROS is as per the HPI.   Physical Examination: BP 110/70  Pulse 78  Temp(Src) 98.4 F (36.9 C) (Oral)  Wt 216 lb (97.977 kg)  BMI 38.27 kg/m2  SpO2 97%  LMP 11/12/2010  Ideal Body Weight:     Gen: WDWN, NAD; A & O x3, cooperative. Pleasant.Globally Non-toxic HEENT: Normocephalic and atraumatic. Throat clear, w/o exudate, R TM clear, L TM - good landmarks, No fluid present. rhinnorhea.  MMM Frontal sinuses: NT Max sinuses: NT NECK: Anterior cervical  LAD is absent CV: RRR, No M/G/R, cap refill <2  sec PULM: Breathing comfortably in no respiratory distress.  no wheezing, crackles, rhonchi EXT: No c/c/e PSYCH: Friendly, good eye contact MSK: Nml gait    Assessment and Plan:  Sinusitis, acute  PANIC ATTACK  ANXIETY DEPRESSION  Most likely a viral URI. I urged her to continue with some supportive care. I did give her a paper prescription for amoxicillin, and told her that she is okay to fill this if she is not improving after a handful of days.  Discussed atypical antipsychotic use. Severe panic attacks, depression, and PTSD. She's currently on an SSRI as well as scheduled as a scheduled benzo use. I suggested that this was certainly normal and common practice, that this would often help and recalcitrant depression and panic attacks. She's been on her antipsychotic for 4 days, and I urged her to continue. Also suggested that she could discuss any kind of further concerns with her psychiatrist at her followup.  Orders Today:  No orders of the defined types were placed in this encounter.    Updated Medication List: (Includes new medications, updates to list, dose adjustments) Meds ordered this encounter  Medications  . traZODone (DESYREL) 50 MG tablet    Sig: Take 50 mg by mouth at bedtime.  Marland Kitchen albuterol (PROVENTIL HFA;VENTOLIN HFA) 108 (90 BASE) MCG/ACT inhaler    Sig: Inhale 2 puffs into the lungs every 4 (four) hours as needed for wheezing.  Marland Kitchen amoxicillin (AMOXIL) 500 MG capsule    Sig: Take 2 capsules (1,000 mg total) by mouth 2 (two) times daily.    Dispense:  40 capsule    Refill:  0    Medications Discontinued: Medications Discontinued During This Encounter  Medication Reason  . benzonatate (TESSALON) 100 MG capsule   . buPROPion (WELLBUTRIN XL) 150 MG 24 hr tablet   . busPIRone (BUSPAR) 10 MG tablet   . cetirizine (ZYRTEC) 10 MG tablet   . diclofenac (VOLTAREN) 75 MG EC tablet   . fluticasone (FLONASE) 50 MCG/ACT nasal spray   . Melatonin 3 MG CAPS   . mometasone  (ELOCON) 0.1 % cream   . pravastatin (PRAVACHOL) 40 MG tablet   . predniSONE (DELTASONE) 20 MG tablet   . Probiotic Product (SOLUBLE FIBER/PROBIOTICS PO)   . ranitidine (ZANTAC) 300 MG tablet   . traZODone (DESYREL) 50 MG tablet Change in therapy  . albuterol (PROVENTIL HFA;VENTOLIN HFA) 108 (90 BASE) MCG/ACT inhaler       Signed, Emoni Yang T. Rudolph Dobler, MD 09/07/2012 9:44 AM

## 2012-09-09 ENCOUNTER — Telehealth: Payer: Self-pay | Admitting: Family

## 2012-09-09 NOTE — Telephone Encounter (Signed)
[facility] Triage Call Report Triage Record Num: [id number] Operator: Thayer Headings Patient Name: [name] Call Date & Time: 09/07/2012 8:28:31AM Patient Phone: [phone] PCP: Adline Mango Patient Gender: Female PCP Fax : Patient DOB: August 11, 1970 [facility] Name: [facility] Reason for Call: Caller: Nimisha/Patient; PCP: Adline Mango (Family Practice); CB#: [phone]; Calling today 09/07/12 regarding Sinus pressure and congestion. Onset 09/05/12. Did not sleep well last night. Temp 99 (O). Emergent symptoms r/o by Upper Respiratory Infection guidelines with exception of pressure/pain above or below eyes, near ears over sinuses AND yellow/green drainage from nose or down back of throat. (See Provider Within 24 Hours). Appt scheduled at [facility] office for today at 9:45 with Dr. Patsy Lager. Protocol(s) Used: Upper Respiratory Infection (URI) Recommended Outcome per Protocol: See Provider within 24 hours Reason for Outcome: Pressure/pain above or below eyes, near ears over sinuses (may also be described as fullness, worsens when bending forward, teeth or eye pain) AND yellow-green drainage from nose or down back of throat. Care Advice: ~ SYMPTOM / CONDITION MANAGEMENT 03/

## 2012-09-23 ENCOUNTER — Encounter: Payer: Self-pay | Admitting: Family

## 2012-09-23 ENCOUNTER — Telehealth: Payer: Self-pay | Admitting: Family

## 2012-09-23 NOTE — Telephone Encounter (Signed)
Pt would like to know hormone level results.

## 2012-09-23 NOTE — Telephone Encounter (Signed)
Results given to pt. Pt requesting that a copy of her labs be sent to Dr. Ambrose Mantle at [facility] with attn. Jaime at # [phone].  Results routed to Dr. Ambrose Mantle through [facility]

## 2012-10-16 ENCOUNTER — Other Ambulatory Visit: Payer: Medicare Other

## 2012-10-17 ENCOUNTER — Other Ambulatory Visit: Payer: Medicare Other

## 2012-10-21 ENCOUNTER — Other Ambulatory Visit (INDEPENDENT_AMBULATORY_CARE_PROVIDER_SITE_OTHER): Payer: Medicare Other

## 2012-10-21 ENCOUNTER — Telehealth: Payer: Self-pay | Admitting: Family

## 2012-10-21 DIAGNOSIS — E1059 Type 1 diabetes mellitus with other circulatory complications: Secondary | ICD-10-CM

## 2012-10-21 LAB — HEMOGLOBIN A1C: Hgb A1c MFr Bld: 6.8 % — ABNORMAL HIGH (ref 4.6–6.5)

## 2012-10-21 NOTE — Telephone Encounter (Signed)
Lab was not collected until 10:50am on 4/28 (today). Results are not available yet

## 2012-10-21 NOTE — Telephone Encounter (Signed)
PT called for results of her a1c from 4/28. Please assist.

## 2012-10-21 NOTE — Telephone Encounter (Signed)
I explained this to her, she stated that [place] told her it would be available in just a few hours.

## 2012-10-21 NOTE — Telephone Encounter (Signed)
Pt aware of result and verbalized

## 2012-10-25 ENCOUNTER — Emergency Department (HOSPITAL_COMMUNITY)
Admission: EM | Admit: 2012-10-25 | Discharge: 2012-10-25 | Disposition: A | Discharge status: Home / Self Care | Payer: No Typology Code available for payment source | Attending: Emergency Medicine | Admitting: Emergency Medicine

## 2012-10-25 ENCOUNTER — Encounter (HOSPITAL_COMMUNITY): Payer: Self-pay | Admitting: Emergency Medicine

## 2012-10-25 DIAGNOSIS — E119 Type 2 diabetes mellitus without complications: Secondary | ICD-10-CM | POA: Insufficient documentation

## 2012-10-25 DIAGNOSIS — S46909A Unspecified injury of unspecified muscle, fascia and tendon at shoulder and upper arm level, unspecified arm, initial encounter: Secondary | ICD-10-CM | POA: Insufficient documentation

## 2012-10-25 DIAGNOSIS — E669 Obesity, unspecified: Secondary | ICD-10-CM | POA: Insufficient documentation

## 2012-10-25 DIAGNOSIS — F4310 Post-traumatic stress disorder, unspecified: Secondary | ICD-10-CM | POA: Insufficient documentation

## 2012-10-25 DIAGNOSIS — Y9389 Activity, other specified: Secondary | ICD-10-CM | POA: Insufficient documentation

## 2012-10-25 DIAGNOSIS — F17200 Nicotine dependence, unspecified, uncomplicated: Secondary | ICD-10-CM | POA: Insufficient documentation

## 2012-10-25 DIAGNOSIS — Z8742 Personal history of other diseases of the female genital tract: Secondary | ICD-10-CM | POA: Insufficient documentation

## 2012-10-25 DIAGNOSIS — Y92410 Unspecified street and highway as the place of occurrence of the external cause: Secondary | ICD-10-CM | POA: Insufficient documentation

## 2012-10-25 DIAGNOSIS — F329 Major depressive disorder, single episode, unspecified: Secondary | ICD-10-CM | POA: Insufficient documentation

## 2012-10-25 DIAGNOSIS — S0993XA Unspecified injury of face, initial encounter: Secondary | ICD-10-CM | POA: Insufficient documentation

## 2012-10-25 DIAGNOSIS — E785 Hyperlipidemia, unspecified: Secondary | ICD-10-CM | POA: Insufficient documentation

## 2012-10-25 DIAGNOSIS — S199XXA Unspecified injury of neck, initial encounter: Secondary | ICD-10-CM | POA: Insufficient documentation

## 2012-10-25 DIAGNOSIS — F411 Generalized anxiety disorder: Secondary | ICD-10-CM | POA: Insufficient documentation

## 2012-10-25 DIAGNOSIS — F988 Other specified behavioral and emotional disorders with onset usually occurring in childhood and adolescence: Secondary | ICD-10-CM | POA: Insufficient documentation

## 2012-10-25 DIAGNOSIS — F3289 Other specified depressive episodes: Secondary | ICD-10-CM | POA: Insufficient documentation

## 2012-10-25 DIAGNOSIS — S3981XA Other specified injuries of abdomen, initial encounter: Secondary | ICD-10-CM | POA: Insufficient documentation

## 2012-10-25 DIAGNOSIS — S4980XA Other specified injuries of shoulder and upper arm, unspecified arm, initial encounter: Secondary | ICD-10-CM | POA: Insufficient documentation

## 2012-10-25 MED ORDER — ONDANSETRON HCL 4 MG PO TABS
4.0000 mg | ORAL_TABLET | Freq: Once | ORAL | Status: AC
  Administered 2012-10-25: 4 mg via ORAL
  Filled 2012-10-25: qty 1

## 2012-10-25 MED ORDER — CYCLOBENZAPRINE HCL 10 MG PO TABS: 10.0000 mg | ORAL_TABLET | Freq: Two times a day (BID) | ORAL | Status: DC | PRN

## 2012-10-25 MED ORDER — HYDROCODONE-ACETAMINOPHEN 5-325 MG PO TABS
1.0000 | ORAL_TABLET | Freq: Once | ORAL | Status: AC
  Administered 2012-10-25: 1 via ORAL
  Filled 2012-10-25: qty 1
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
## 2012-10-28 ENCOUNTER — Telehealth: Payer: Self-pay | Admitting: Family

## 2012-10-28 NOTE — ED Provider Notes (Signed)
Medical screening examination/treatment/procedure(s) were performed by non-physician practitioner and as supervising physician I was immediately available for consultation/collaboration.   Shermeka Rutt M Anthem Frazer, MD 10/28/12 0004 

## 2012-10-28 NOTE — Telephone Encounter (Signed)
noted 
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
## 2012-10-29 ENCOUNTER — Ambulatory Visit (INDEPENDENT_AMBULATORY_CARE_PROVIDER_SITE_OTHER): Payer: Medicare Other | Admitting: Family

## 2012-10-29 ENCOUNTER — Encounter: Payer: Self-pay | Admitting: Family

## 2012-10-29 VITALS — BP 108/70 | HR 89 | Wt 223.0 lb

## 2012-10-29 DIAGNOSIS — M545 Low back pain, unspecified: Secondary | ICD-10-CM

## 2012-10-29 DIAGNOSIS — M542 Cervicalgia: Secondary | ICD-10-CM

## 2012-10-29 MED ORDER — TRAMADOL HCL 50 MG PO TABS: 50.0000 mg | ORAL_TABLET | Freq: Three times a day (TID) | ORAL | Status: DC | PRN
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
## 2012-10-31 ENCOUNTER — Other Ambulatory Visit (HOSPITAL_COMMUNITY): Payer: Self-pay | Admitting: Chiropractic Medicine

## 2012-10-31 ENCOUNTER — Ambulatory Visit (HOSPITAL_COMMUNITY)
Admission: RE | Admit: 2012-10-31 | Discharge: 2012-10-31 | Disposition: A | Discharge status: Home / Self Care | Payer: No Typology Code available for payment source | Source: Ambulatory Visit | Attending: Chiropractic Medicine | Admitting: Chiropractic Medicine

## 2012-10-31 DIAGNOSIS — M545 Low back pain, unspecified: Secondary | ICD-10-CM

## 2012-10-31 DIAGNOSIS — M542 Cervicalgia: Secondary | ICD-10-CM | POA: Insufficient documentation

## 2012-11-29 ENCOUNTER — Telehealth: Payer: Self-pay | Admitting: Family

## 2012-11-29 NOTE — Telephone Encounter (Signed)
Patient Information:  Caller Name: Odyssey  Phone: [phone]  Patient: [name], [name]  Gender: Female  DOB: [birth year]  Age: 42 Years  PCP: Adline Mango [facility])  Pregnant: No  Office Follow Up:  Does the office need to follow up with this patient?: No  Instructions For The Office: N/A  RN Note:  Pt has had diarrhea for 4 days w/ intermittent abdominal pain that improves after diarrhea. Discussed weekend hrs at [facility] if symptoms worsen, advised Pt to f/u w/ office on 6-9 if symptoms didn't improve after home care.  Symptoms  Reason For Call & Symptoms: Diarrhea, Nausea  Reviewed Health History In EMR: Yes  Reviewed Medications In EMR: Yes  Reviewed Allergies In EMR: Yes  Reviewed Surgeries / Procedures: Yes  Date of Onset of Symptoms: 11/25/2012 OB / GYN:  LMP: Unknown  Guideline(s) Used:  Diarrhea  Disposition Per Guideline:   Home Care  Reason For Disposition Reached:   Mild diarrhea  Advice Given:  Reassurance:  In healthy adults, new-onset diarrhea is usually caused by a viral infection of the intestines, which you can treat at home. Diarrhea is the body's way of getting rid of the infection. Here are some tips on how to keep ahead of the fluid losses.  Fluids:  Drink more fluids, at least 8-10 glasses (8 oz or 240 ml) daily.  For example: sports drinks, diluted fruit juices, soft drinks.  Supplement this with saltine crackers or soups to make certain that you are getting sufficient fluid and salt to meet your body's needs.  Avoid caffeinated beverages (Reason: caffeine is mildly dehydrating).  Nutrition:  Ideal initial foods include boiled starches/cereals (e.g., potatoes, rice, noodles, wheat, oats) with a small amount of salt to taste.  Expected Course:  Viral diarrhea lasts 4-7 days. Always worse on days 1 and 2.  Call Back If:  Signs of dehydration occur (e.g., no urine for more than 12 hours, very dry mouth, lightheaded, etc.)  Diarrhea lasts  over 7 days  You become worse.  Diarrhea Medication  - Imodium AD:   Adult dosage: 4 mg (2 capsules or 4 teaspoons or 20 ml) is the recommended first dose. You may take an additional 2 mg (1 capsule or 2 teaspoons or 10 ml) after each loose BM.  Maximum dosage: 16 mg (8 capsules or 16 teaspoons or 80 ml).  1 capsule = 2 mg; 1 teaspoon (5 ml) = 1 mg.  Caution: Do not use if you have a fever greater than 100F (37.8C). Do not use if there is blood or mucus in your stools. Do not use for more than 2 days.  Patient Will Follow Care Advice:  YES

## 2012-12-02 ENCOUNTER — Other Ambulatory Visit: Payer: Self-pay | Admitting: Family

## 2012-12-03 ENCOUNTER — Other Ambulatory Visit: Payer: Self-pay

## 2012-12-03 MED ORDER — GLUCOSE BLOOD VI STRP: ORAL_STRIP | Status: DC

## 2012-12-06 ENCOUNTER — Other Ambulatory Visit: Payer: Self-pay

## 2012-12-06 MED ORDER — GLUCOSE BLOOD VI STRP: ORAL_STRIP | Status: DC

## 2013-01-07 ENCOUNTER — Other Ambulatory Visit: Payer: Self-pay | Admitting: Family

## 2013-01-08 ENCOUNTER — Other Ambulatory Visit: Payer: Self-pay

## 2013-01-08 MED ORDER — GLUCOSE BLOOD VI STRP: ORAL_STRIP | Status: DC

## 2013-02-03 ENCOUNTER — Other Ambulatory Visit: Payer: Self-pay

## 2013-02-03 DIAGNOSIS — Z1231 Encounter for screening mammogram for malignant neoplasm of breast: Secondary | ICD-10-CM

## 2013-02-17 ENCOUNTER — Ambulatory Visit
Admission: RE | Admit: 2013-02-17 | Discharge: 2013-02-17 | Disposition: A | Discharge status: Home / Self Care | Payer: Medicare Other | Source: Ambulatory Visit

## 2013-02-17 DIAGNOSIS — Z1231 Encounter for screening mammogram for malignant neoplasm of breast: Secondary | ICD-10-CM

## 2013-03-13 ENCOUNTER — Other Ambulatory Visit (INDEPENDENT_AMBULATORY_CARE_PROVIDER_SITE_OTHER): Payer: Medicare Other

## 2013-03-13 ENCOUNTER — Ambulatory Visit (INDEPENDENT_AMBULATORY_CARE_PROVIDER_SITE_OTHER): Payer: Medicare Other

## 2013-03-13 DIAGNOSIS — J209 Acute bronchitis, unspecified: Secondary | ICD-10-CM

## 2013-03-13 DIAGNOSIS — Z23 Encounter for immunization: Secondary | ICD-10-CM

## 2013-03-13 DIAGNOSIS — R7309 Other abnormal glucose: Secondary | ICD-10-CM

## 2013-03-13 DIAGNOSIS — Z0000 Encounter for general adult medical examination without abnormal findings: Secondary | ICD-10-CM

## 2013-03-13 LAB — BASIC METABOLIC PANEL
CO2: 27 mEq/L (ref 19–32)
Calcium: 9.1 mg/dL (ref 8.4–10.5)
Glucose, Bld: 126 mg/dL — ABNORMAL HIGH (ref 70–99)
Potassium: 3.8 mEq/L (ref 3.5–5.1)
Sodium: 136 mEq/L (ref 135–145)

## 2013-03-13 LAB — CBC WITH DIFFERENTIAL/PLATELET
Basophils Relative: 0.8 % (ref 0.0–3.0)
Eosinophils Absolute: 0.2 10*3/uL (ref 0.0–0.7)
HCT: 40.9 % (ref 36.0–46.0)
Hemoglobin: 13.9 g/dL (ref 12.0–15.0)
Lymphocytes Relative: 27.4 % (ref 12.0–46.0)
Lymphs Abs: 1.8 10*3/uL (ref 0.7–4.0)
MCHC: 33.9 g/dL (ref 30.0–36.0)
Monocytes Relative: 8.8 % (ref 3.0–12.0)
Neutro Abs: 3.8 10*3/uL (ref 1.4–7.7)
RBC: 4.49 Mil/uL (ref 3.87–5.11)
RDW: 12.9 % (ref 11.5–14.6)

## 2013-03-13 LAB — TSH: TSH: 1.14 u[IU]/mL (ref 0.35–5.50)

## 2013-03-13 LAB — LIPID PANEL
HDL: 44.2 mg/dL (ref >39.00)
Total CHOL/HDL Ratio: 4
VLDL: 21.2 mg/dL (ref 0.0–40.0)

## 2013-03-13 LAB — MICROALBUMIN / CREATININE URINE RATIO
Creatinine,U: 119.6 mg/dL
Microalb Creat Ratio: 0.3 mg/g (ref 0.0–30.0)
Microalb, Ur: 0.3 mg/dL (ref 0.0–1.9)

## 2013-03-13 LAB — HEPATIC FUNCTION PANEL
Albumin: 4.2 g/dL (ref 3.5–5.2)
Total Protein: 7.3 g/dL (ref 6.0–8.3)

## 2013-03-13 LAB — POCT URINALYSIS DIPSTICK
Leukocytes, UA: NEGATIVE
Nitrite, UA: NEGATIVE
Protein, UA: NEGATIVE
Urobilinogen, UA: 0.2
pH, UA: 5.5

## 2013-03-14 ENCOUNTER — Other Ambulatory Visit: Payer: Medicare Other

## 2013-03-14 ENCOUNTER — Telehealth: Payer: Self-pay | Admitting: Family

## 2013-03-14 NOTE — Telephone Encounter (Signed)
Patient Information:  Caller Name: Cherine  Phone: [phone]  Patient: [name]  Gender: Female  DOB: 1970/11/05  Age: 42 Years  PCP: Adline Mango [facility])  Pregnant: No  Office Follow Up:  Does the office need to follow up with this patient?: No  Instructions For The Office: N/A  RN Note:  Patient received her flu shot in office 03/13/13.  States onset of mild nausea and fatigue AM 03/14/13 and wants to know if this is a side effect of the vaccine.  Per VIS sheet, advised fatigue, fever, and headache are common side effects; triaged for nausea.  Emergent symptoms denied; care advice given, with callback parameters.  krs/can  Symptoms  Reason For Call & Symptoms: flu shot reaction?  Reviewed Health History In EMR: Yes  Reviewed Medications In EMR: Yes  Reviewed Allergies In EMR: Yes  Reviewed Surgeries / Procedures: Yes  Date of Onset of Symptoms: 03/14/2013 OB / GYN:  LMP: Unknown  Guideline(s) Used:  Vomiting  Disposition Per Guideline:   Home Care  Reason For Disposition Reached:   Vomiting  Advice Given:  Reassurance:  You can treat vomiting, even if there is mild dehydration, at home.  Clear Liquids:  Sip water or a rehydration drink (e.g., Gatorade or Powerade).  Other options: 1/2 strength flat lemon-lime soda or ginger ale.  After 4 hours without vomiting, increase the amount.  Expected Course:  Vomiting from viral gastritis usually stops in 12 to 48 hours.  People with mild dehydration can usually treat themselves at home, by drinking more liquids.  People with moderate to severe dehydration may need medical care. signs of this include very dry mouth, dizziness, weakness, and decreased urination.  Call Back If:  Vomiting lasts for more than 2 days (48 hours)  Signs of dehydration occur  You become worse.  Patient Will Follow Care Advice:  YES

## 2013-03-18 ENCOUNTER — Encounter: Payer: Self-pay | Admitting: Family

## 2013-03-18 ENCOUNTER — Ambulatory Visit (INDEPENDENT_AMBULATORY_CARE_PROVIDER_SITE_OTHER): Payer: Medicare Other | Admitting: Family

## 2013-03-18 VITALS — BP 98/60 | HR 69 | Temp 98.6°F | Wt 222.0 lb

## 2013-03-18 DIAGNOSIS — F17200 Nicotine dependence, unspecified, uncomplicated: Secondary | ICD-10-CM

## 2013-03-18 DIAGNOSIS — R05 Cough: Secondary | ICD-10-CM

## 2013-03-18 DIAGNOSIS — R059 Cough, unspecified: Secondary | ICD-10-CM

## 2013-03-18 DIAGNOSIS — Z720 Tobacco use: Secondary | ICD-10-CM

## 2013-03-18 DIAGNOSIS — J209 Acute bronchitis, unspecified: Secondary | ICD-10-CM

## 2013-03-18 MED ORDER — DOXYCYCLINE HYCLATE 100 MG PO TABS: 100.0000 mg | ORAL_TABLET | Freq: Two times a day (BID) | ORAL | Status: DC

## 2013-03-18 NOTE — Progress Notes (Signed)
Subjective:    Patient ID: [name], female    DOB: 07/10/1970, 42 y.o.   MRN: [id number]  HPI 42 year old white female, 1 pack per day smoker is in today with complaints of nasal congestion, cough, wheezing, fever and chills x3 days and worsening. She's been using Nasacort, taken Tylenol and Advil without much relief. Reports a productive cough with a green phlegm. Has a history of pneumonia in the past.   Review of Systems  Constitutional: Positive for fever and fatigue.  HENT: Positive for congestion and postnasal drip.   Eyes: Negative.   Respiratory: Positive for cough and wheezing.   Musculoskeletal: Negative.   Skin: Negative.   Allergic/Immunologic: Negative.   Neurological: Negative.   Hematological: Negative.   Psychiatric/Behavioral: Negative.    Past Medical History  Diagnosis Date  . Obesity   . Dyslipidemia   . Diabetes mellitus   . Hyperlipidemia   . Obesity   . Tobacco use disorder   . Anxiety     Dr. Donnie Aho, [facility]  . Depression     Clinical psychologist, Erlinda Hong  . ADD (attention deficit disorder)   . Cystocele     sees gyn, Dr. Reinaldo Meeker  . Rectocele   . PTSD (post-traumatic stress disorder)     History   Social History  . Marital Status: Married    Spouse Name: N/A    Number of Children: 2  . Years of Education: N/A   Occupational History  . Not on file.   Social History Main Topics  . Smoking status: Current Every Day Smoker -- 1.00 packs/day for 7 years    Last Attempt to Quit: 09/13/2011  . Smokeless tobacco: Former Neurosurgeon    Quit date: 08/11/2011  . Alcohol Use: No  . Drug Use: No  . Sexual Activity: Not on file     Comment: trying to get disability; lives with husband; has children, but mother has custody   Other Topics Concern  . Not on file   Social History Narrative  . No narrative on file    Past Surgical History  Procedure Laterality Date  . Tubal ligation    . Cholecystectomy    .  Diagnostic mammogram      never; sees Dr. Reinaldo Meeker  . Abdominal hysterectomy    . Incontinence surgery  June 2012  . Bladder suspension    . Rectocele repair    . Cystocele repair      Family History  Problem Relation Age of Onset  . Depression Mother   . Hyperlipidemia Mother   . Hypertension Mother   . Diabetes Mother   . Hyperlipidemia Father   . Depression Father   . Nephrolithiasis Brother     Allergies  Allergen Reactions  . Codeine Nausea And Vomiting    vomiting  . Nitrofurantoin Other (See Comments)    "extreme dehydration" requiring hospitalization  . Sulfa Antibiotics Other (See Comments)    Pt does not remember reaction    Current Outpatient Prescriptions on File Prior to Visit  Medication Sig Dispense Refill  . cholecalciferol (VITAMIN D) 1000 UNITS tablet Take 1,000 Units by mouth every morning.      . citalopram (CELEXA) 20 MG tablet Take 20 mg by mouth 2 (two) times daily.      . clonazePAM (KLONOPIN) 2 MG tablet Take 0.5 mg by mouth 3 (three) times daily as needed for anxiety.       . cyclobenzaprine (FLEXERIL) 10 MG  tablet Take 1 tablet (10 mg total) by mouth 2 (two) times daily as needed for muscle spasms.  20 tablet  0  . glucose blood (FREESTYLE LITE) test strip TEST BLOOD SUGAR ONCE DAILY FASTING  100 each  2  . ibuprofen (ADVIL,MOTRIN) 200 MG tablet Take 400 mg by mouth every 6 (six) hours as needed for pain or headache.      . Lurasidone HCl (LATUDA) 20 MG TABS Take 1 tablet by mouth every evening.      . pravastatin (PRAVACHOL) 40 MG tablet Take 40 mg by mouth every evening.      . pravastatin (PRAVACHOL) 40 MG tablet TAKE ONE TABLET BY MOUTH EVERY DAY  30 tablet  1  . traMADol (ULTRAM) 50 MG tablet Take 1 tablet (50 mg total) by mouth every 8 (eight) hours as needed for pain.  30 tablet  1  . traZODone (DESYREL) 50 MG tablet Take 50 mg by mouth at bedtime.      . vitamin C (ASCORBIC ACID) 500 MG tablet Take 500 mg by mouth every morning.      Marland Kitchen  albuterol (PROVENTIL HFA;VENTOLIN HFA) 108 (90 BASE) MCG/ACT inhaler Inhale 2 puffs into the lungs every 4 (four) hours as needed for wheezing or shortness of breath.        No current facility-administered medications on file prior to visit.    BP 98/60  Pulse 69  Temp(Src) 98.6 F (37 C) (Oral)  Wt 222 lb (100.699 kg)  BMI 35.85 kg/m2  LMP 05/19/2012chart    Objective:   Physical Exam  Constitutional: She is oriented to person, place, and time. She appears well-developed and well-nourished.  HENT:  Right Ear: External ear normal.  Left Ear: External ear normal.  Nose: Nose normal.  Mouth/Throat: Oropharynx is clear and moist.  Neck: Normal range of motion. Neck supple.  Cardiovascular: Normal rate, regular rhythm and normal heart sounds.   Pulmonary/Chest: Effort normal. She has wheezes.  Musculoskeletal: Normal range of motion.  Neurological: She is alert and oriented to person, place, and time.  Skin: Skin is warm and dry.  Psychiatric: She has a normal mood and affect.          Assessment & Plan:  Assessment: 1. Acute bronchitis 2. Possible pneumonia 3. Tobacco abuse  Plan: Doxycycline 100 mg twice a day x10 days. Symbicort inhaler 2 puffs twice a day. Continue Nasacort. Cardiac his symptoms worsen or persist. Recheck in 3 days and sooner as needed.

## 2013-03-18 NOTE — Patient Instructions (Addendum)

## 2013-03-21 ENCOUNTER — Encounter: Payer: Self-pay | Admitting: Family

## 2013-03-21 ENCOUNTER — Ambulatory Visit (INDEPENDENT_AMBULATORY_CARE_PROVIDER_SITE_OTHER): Payer: Medicare Other | Admitting: Family

## 2013-03-21 VITALS — BP 102/60 | HR 75 | Ht 65.0 in | Wt 221.0 lb

## 2013-03-21 DIAGNOSIS — Z0000 Encounter for general adult medical examination without abnormal findings: Secondary | ICD-10-CM

## 2013-03-21 DIAGNOSIS — R7309 Other abnormal glucose: Secondary | ICD-10-CM

## 2013-03-21 DIAGNOSIS — F17200 Nicotine dependence, unspecified, uncomplicated: Secondary | ICD-10-CM

## 2013-03-21 DIAGNOSIS — E785 Hyperlipidemia, unspecified: Secondary | ICD-10-CM

## 2013-03-21 DIAGNOSIS — F319 Bipolar disorder, unspecified: Secondary | ICD-10-CM

## 2013-03-21 DIAGNOSIS — R739 Hyperglycemia, unspecified: Secondary | ICD-10-CM

## 2013-03-21 DIAGNOSIS — Z720 Tobacco use: Secondary | ICD-10-CM

## 2013-03-21 MED ORDER — METHYLPREDNISOLONE 4 MG PO KIT: PACK | ORAL | Status: AC

## 2013-03-21 NOTE — Patient Instructions (Signed)

## 2013-03-21 NOTE — Progress Notes (Signed)
Subjective:    Patient ID: [name], female    DOB: February 25, 1971, 42 y.o.   MRN: [id number]  HPI  42 year old WF, smoker, is in for a routine physical examination for this healthy  Female. Reviewed all health maintenance protocols including mammography colonoscopy bone density and reviewed appropriate screening labs. Her immunization history was reviewed as well as her current medications and allergies refills of her chronic medications were given and the plan for yearly health maintenance was discussed all orders and referrals were made as appropriate.  Review of Systems  Constitutional: Negative.   HENT: Negative.   Eyes: Negative.   Respiratory: Negative.   Cardiovascular: Negative.   Gastrointestinal: Negative.   Endocrine: Negative.   Genitourinary: Negative.   Musculoskeletal: Negative.   Skin: Negative.   Allergic/Immunologic: Negative.   Neurological: Negative.   Hematological: Negative.   Psychiatric/Behavioral: Negative.    Past Medical History  Diagnosis Date  . Obesity   . Dyslipidemia   . Diabetes mellitus   . Hyperlipidemia   . Obesity   . Tobacco use disorder   . Anxiety     Dr. Donnie Aho, [facility]  . Depression     Clinical psychologist, Erlinda Hong  . ADD (attention deficit disorder)   . Cystocele     sees gyn, Dr. Reinaldo Meeker  . Rectocele   . PTSD (post-traumatic stress disorder)     History   Social History  . Marital Status: Married    Spouse Name: N/A    Number of Children: 2  . Years of Education: N/A   Occupational History  . Not on file.   Social History Main Topics  . Smoking status: Current Every Day Smoker -- 1.00 packs/day for 7 years    Last Attempt to Quit: 09/13/2011  . Smokeless tobacco: Former Neurosurgeon    Quit date: 08/11/2011  . Alcohol Use: No  . Drug Use: No  . Sexual Activity: Not on file     Comment: trying to get disability; lives with husband; has children, but mother has custody   Other Topics  Concern  . Not on file   Social History Narrative  . No narrative on file    Past Surgical History  Procedure Laterality Date  . Tubal ligation    . Cholecystectomy    . Diagnostic mammogram      never; sees Dr. Reinaldo Meeker  . Abdominal hysterectomy    . Incontinence surgery  June 2012  . Bladder suspension    . Rectocele repair    . Cystocele repair      Family History  Problem Relation Age of Onset  . Depression Mother   . Hyperlipidemia Mother   . Hypertension Mother   . Diabetes Mother   . Hyperlipidemia Father   . Depression Father   . Nephrolithiasis Brother     Allergies  Allergen Reactions  . Codeine Nausea And Vomiting    vomiting  . Nitrofurantoin Other (See Comments)    "extreme dehydration" requiring hospitalization  . Sulfa Antibiotics Other (See Comments)    Pt does not remember reaction    Current Outpatient Prescriptions on File Prior to Visit  Medication Sig Dispense Refill  . albuterol (PROVENTIL HFA;VENTOLIN HFA) 108 (90 BASE) MCG/ACT inhaler Inhale 2 puffs into the lungs every 4 (four) hours as needed for wheezing or shortness of breath.       . cholecalciferol (VITAMIN D) 1000 UNITS tablet Take 1,000 Units by mouth every morning.      Marland Kitchen  citalopram (CELEXA) 20 MG tablet Take 20 mg by mouth 2 (two) times daily.      . clonazePAM (KLONOPIN) 2 MG tablet Take 0.5 mg by mouth 3 (three) times daily as needed for anxiety.       . cyclobenzaprine (FLEXERIL) 10 MG tablet Take 1 tablet (10 mg total) by mouth 2 (two) times daily as needed for muscle spasms.  20 tablet  0  . doxycycline (VIBRA-TABS) 100 MG tablet Take 1 tablet (100 mg total) by mouth 2 (two) times daily.  20 tablet  0  . glucose blood (FREESTYLE LITE) test strip TEST BLOOD SUGAR ONCE DAILY FASTING  100 each  2  . ibuprofen (ADVIL,MOTRIN) 200 MG tablet Take 400 mg by mouth every 6 (six) hours as needed for pain or headache.      . Lurasidone HCl (LATUDA) 20 MG TABS Take 1 tablet by mouth every  evening.      . pravastatin (PRAVACHOL) 40 MG tablet Take 40 mg by mouth every evening.      . pravastatin (PRAVACHOL) 40 MG tablet TAKE ONE TABLET BY MOUTH EVERY DAY  30 tablet  1  . traMADol (ULTRAM) 50 MG tablet Take 1 tablet (50 mg total) by mouth every 8 (eight) hours as needed for pain.  30 tablet  1  . traZODone (DESYREL) 50 MG tablet Take 50 mg by mouth at bedtime.      . vitamin C (ASCORBIC ACID) 500 MG tablet Take 500 mg by mouth every morning.       No current facility-administered medications on file prior to visit.    BP 102/60  Pulse 75  Ht 5\' 5"  (1.651 m)  Wt 221 lb (100.245 kg)  BMI 36.78 kg/m2  SpO2 96%  LMP 05/19/2012chart    Objective:   Physical Exam  Constitutional: She is oriented to person, place, and time. She appears well-developed and well-nourished.  HENT:  Head: Normocephalic.  Right Ear: External ear normal.  Left Ear: External ear normal.  Nose: Nose normal.  Eyes: Conjunctivae and EOM are normal. Pupils are equal, round, and reactive to light.  Neck: Normal range of motion. Neck supple.  Cardiovascular: Normal rate, regular rhythm and normal heart sounds.   Pulmonary/Chest: Effort normal and breath sounds normal.  Abdominal: Soft. Bowel sounds are normal.  Musculoskeletal: Normal range of motion.  Neurological: She is alert and oriented to person, place, and time. She has normal reflexes. She displays normal reflexes. No cranial nerve deficit. Coordination normal.  Skin: Skin is warm and dry.  Psychiatric: She has a normal mood and affect.          Assessment & Plan:  Assessment: 1. CPX 2. Type 2 Diabetes 3. Hypercholesterolemia 4. Tobacco Abuse 5. Hyperglycemia  Plan: Encouraged the diet, exercise, weight reduction. Follow up to recheck hyperglycemia in 6 months. Continue current medications. Strongly encourage smoking cessation.

## 2013-03-25 ENCOUNTER — Telehealth: Payer: Self-pay | Admitting: Family

## 2013-03-25 MED ORDER — FLUCONAZOLE 150 MG PO TABS: 150.0000 mg | ORAL_TABLET | Freq: Once | ORAL | Status: DC

## 2013-03-25 NOTE — Telephone Encounter (Signed)
Pt aware.

## 2013-03-25 NOTE — Telephone Encounter (Signed)
Patient Information:  Caller Name: Keilah  Phone: [phone]  Patient: [name], [name]  Gender: Female  DOB: 1971/03/02  Age: 42 Years  PCP: Adline Mango [facility])  Pregnant: No  Office Follow Up:  Does the office need to follow up with this patient?: Yes  Instructions For The Office: Patient request Diflucan  RN Note:  Pharmacy- [facility] in [place] phone [phone].  Patient is asking for script for Diflucan due to antibiotic. Advised that I would forward request to physician for call back  Symptoms  Reason For Call & Symptoms: Patient states that she was placed on antibiotics Doxycyline started last week 03/21/13 for pneumonia.  She complains of yeast infection symptoms onset yesterday 03/24/13.  Vaginal itching and  white discharge, slight odor. .. Asking for scirpt for Diflucan due to antibiotic.  Reviewed Health History In EMR: Yes  Reviewed Medications In EMR: Yes  Reviewed Allergies In EMR: Yes  Reviewed Surgeries / Procedures: Yes  Date of Onset of Symptoms: 03/24/2013 OB / GYN:  LMP: Unknown  Guideline(s) Used:  Vaginal Discharge  Disposition Per Guideline:   Home Care  Reason For Disposition Reached:   Symptoms of a vaginal yeast infection (i.e., white, thick, cottage-cheese-like, itchy, not bad smelling discharge)  Advice Given:  Genital Hygiene:   Keep your genital area clean. Wash daily.  Keep your genital area dry. Wear cotton underwear or underwear with a cotton crotch.  Do not douche.  Do not use feminine hygiene products.  Call Back If:  You become worse.  RN Overrode Recommendation:  Patient Requests Prescription  Patient request Diflucan

## 2013-03-25 NOTE — Telephone Encounter (Signed)
Left message on machine for patient that Rx sent to pharmacy. 

## 2013-03-25 NOTE — Telephone Encounter (Signed)
Sent. pls advise patient

## 2013-04-28 ENCOUNTER — Telehealth: Payer: Self-pay | Admitting: Family

## 2013-04-28 NOTE — Telephone Encounter (Signed)
Pt needs refill on freestyle lite test strip sent to [facility] [place] ,[place]

## 2013-04-29 MED ORDER — GLUCOSE BLOOD VI STRP: ORAL_STRIP | Status: DC

## 2013-04-30 ENCOUNTER — Telehealth: Payer: Self-pay | Admitting: Family

## 2013-04-30 NOTE — Telephone Encounter (Signed)
Pt needs appt fri for head congestion, cough, runny nose. Was scheduled w/ another md thurs but had to cancel due to not have transportation. Need 11:15 appt, its a SD. Ok to sch?

## 2013-04-30 NOTE — Telephone Encounter (Signed)
Patient Information:  Caller Name: Jomaira  Phone: [phone]  Patient: [name], [name]  Gender: Female  DOB: [birth year]  Age: 42 Years  PCP: Ad[facility])  Pregnant: No  Office Follow Up:  Does the office need to follow up with this patient?: No  Instructions For The Office: N/A  RN Note:  Cold symptoms with fatigue and head pressure. Concerned may have sinus infection.  FBS not tested.  Blood sugar 148 this afternoon.  Reported she is unable to get appointment until possibly 05/02/13. Reports some swelling around both eyes. Cannot take decongestant due to anxiety. Instructed to hydrate and humidify. Too late for office appointment 04/30/13; referred to [facility] per MD protocol.  Symptoms  Reason For Call & Symptoms: Dry cough with clear rhinorrhea. Does not have much Alburerol left so has not tried it.  Reviewed Health History In EMR: Yes  Reviewed Medications In EMR: Yes  Reviewed Allergies In EMR: Yes  Reviewed Surgeries / Procedures: Yes  Date of Onset of Symptoms: 04/28/2013  Treatments Tried: DayQyuil, Mucinex  Treatments Tried Worked: No OB / GYN:  LMP: Unknown  Guideline(s) Used:  Sinus Pain and Congestion  Disposition Per Guideline:   Go to Office Now  Reason For Disposition Reached:   Redness or swelling on the cheek, forehead, or around the eye  Advice Given:  Reassurance:   Sinus congestion is a normal part of a cold.  Antibiotics are not helpful for the sinus congestion that occurs with colds.  For a Stuffy Nose - Use Nasal Washes:  Introduction: Saline (salt water) nasal irrigation (nasal wash) is an effective and simple home remedy for treating stuffy nose and sinus congestion. The nose can be irrigated by pouring, spraying, or squirting salt water into the nose and then letting it run back out.  How it Helps: The salt water rinses out excess mucus, washes out any irritants (dust, allergens) that might be present, and moistens the  nasal cavity.  Medicines for a Stuffy or Runny Nose:  Most cold medicines that are available over-the-counter (OTC) are not helpful.  Antihistamines: Are only helpful if you also have nasal allergies.  Hydration:  Drink plenty of liquids (6-8 glasses of water daily). If the air in your home is dry, use a cool mist humidifier  Call Back If:   Sinus congestion (fullness) lasts longer than 10 days  Fever lasts longer than 3 days  You become worse.  RN Overrode Recommendation:  Go To U.C.  Too late for office appointment.

## 2013-05-01 ENCOUNTER — Ambulatory Visit: Payer: Medicare Other | Admitting: Family Medicine

## 2013-05-01 ENCOUNTER — Encounter: Payer: Self-pay | Admitting: Family

## 2013-05-01 ENCOUNTER — Ambulatory Visit (INDEPENDENT_AMBULATORY_CARE_PROVIDER_SITE_OTHER): Payer: Medicare Other | Admitting: Family

## 2013-05-01 VITALS — BP 100/62 | HR 80 | Wt 225.0 lb

## 2013-05-01 DIAGNOSIS — F17200 Nicotine dependence, unspecified, uncomplicated: Secondary | ICD-10-CM

## 2013-05-01 DIAGNOSIS — R059 Cough, unspecified: Secondary | ICD-10-CM

## 2013-05-01 DIAGNOSIS — J069 Acute upper respiratory infection, unspecified: Secondary | ICD-10-CM

## 2013-05-01 DIAGNOSIS — R05 Cough: Secondary | ICD-10-CM

## 2013-05-01 NOTE — Progress Notes (Signed)
Subjective:    Patient ID: [name], female    DOB: July 26, 1970, 42 y.o.   MRN: [id number]  HPI 42 year old white female, smoker is in today with complaints of cough, congestion, postnasal drip x2 days. She's been taken one half tablet of Benadryl daily has not helped. She has a history of type 2 diabetes, hypercholesterolemia, bipolar disorder.   Review of Systems  Constitutional: Negative.   HENT: Positive for postnasal drip and rhinorrhea.   Respiratory: Positive for cough.   Cardiovascular: Negative.   Gastrointestinal: Negative.   Endocrine: Negative.   Genitourinary: Negative.   Musculoskeletal: Negative.   Skin: Negative.   Allergic/Immunologic: Negative.   Neurological: Negative.   Hematological: Negative.   Psychiatric/Behavioral: Negative.    Past Medical History  Diagnosis Date  . Obesity   . Dyslipidemia   . Diabetes mellitus   . Hyperlipidemia   . Obesity   . Tobacco use disorder   . Anxiety     Dr. Donnie Aho, [facility]  . Depression     Clinical psychologist, Erlinda Hong  . ADD (attention deficit disorder)   . Cystocele     sees gyn, Dr. Reinaldo Meeker  . Rectocele   . PTSD (post-traumatic stress disorder)     History   Social History  . Marital Status: Married    Spouse Name: N/A    Number of Children: 2  . Years of Education: N/A   Occupational History  . Not on file.   Social History Main Topics  . Smoking status: Current Every Day Smoker -- 1.00 packs/day for 7 years    Last Attempt to Quit: 09/13/2011  . Smokeless tobacco: Former Neurosurgeon    Quit date: 08/11/2011  . Alcohol Use: No  . Drug Use: No  . Sexual Activity: Not on file     Comment: trying to get disability; lives with husband; has children, but mother has custody   Other Topics Concern  . Not on file   Social History Narrative  . No narrative on file    Past Surgical History  Procedure Laterality Date  . Tubal ligation    . Cholecystectomy    . Diagnostic  mammogram      never; sees Dr. Reinaldo Meeker  . Abdominal hysterectomy    . Incontinence surgery  June 2012  . Bladder suspension    . Rectocele repair    . Cystocele repair      Family History  Problem Relation Age of Onset  . Depression Mother   . Hyperlipidemia Mother   . Hypertension Mother   . Diabetes Mother   . Hyperlipidemia Father   . Depression Father   . Nephrolithiasis Brother     Allergies  Allergen Reactions  . Codeine Nausea And Vomiting    vomiting  . Nitrofurantoin Other (See Comments)    "extreme dehydration" requiring hospitalization  . Sulfa Antibiotics Other (See Comments)    Pt does not remember reaction    Current Outpatient Prescriptions on File Prior to Visit  Medication Sig Dispense Refill  . albuterol (PROVENTIL HFA;VENTOLIN HFA) 108 (90 BASE) MCG/ACT inhaler Inhale 2 puffs into the lungs every 4 (four) hours as needed for wheezing or shortness of breath.       . cholecalciferol (VITAMIN D) 1000 UNITS tablet Take 1,000 Units by mouth every morning.      . citalopram (CELEXA) 20 MG tablet Take 20 mg by mouth 2 (two) times daily.      . clonazePAM (KLONOPIN) 2  MG tablet Take 0.5 mg by mouth 3 (three) times daily as needed for anxiety.       . cyclobenzaprine (FLEXERIL) 10 MG tablet Take 1 tablet (10 mg total) by mouth 2 (two) times daily as needed for muscle spasms.  20 tablet  0  . doxycycline (VIBRA-TABS) 100 MG tablet Take 1 tablet (100 mg total) by mouth 2 (two) times daily.  20 tablet  0  . fluconazole (DIFLUCAN) 150 MG tablet Take 1 tablet (150 mg total) by mouth once.  1 tablet  0  . glucose blood (FREESTYLE LITE) test strip TEST BLOOD SUGAR ONCE DAILY FASTING  100 each  2  . ibuprofen (ADVIL,MOTRIN) 200 MG tablet Take 400 mg by mouth every 6 (six) hours as needed for pain or headache.      . Lurasidone HCl (LATUDA) 20 MG TABS Take 1 tablet by mouth every evening.      . pravastatin (PRAVACHOL) 40 MG tablet Take 40 mg by mouth every evening.      .  pravastatin (PRAVACHOL) 40 MG tablet TAKE ONE TABLET BY MOUTH EVERY DAY  30 tablet  1  . traMADol (ULTRAM) 50 MG tablet Take 1 tablet (50 mg total) by mouth every 8 (eight) hours as needed for pain.  30 tablet  1  . traZODone (DESYREL) 50 MG tablet Take 50 mg by mouth at bedtime.      . vitamin C (ASCORBIC ACID) 500 MG tablet Take 500 mg by mouth every morning.       No current facility-administered medications on file prior to visit.    BP 100/62  Pulse 80  Wt 225 lb (102.059 kg)  LMP 05/19/2012chart    Objective:   Physical Exam  Constitutional: She is oriented to person, place, and time. She appears well-developed and well-nourished.  HENT:  Right Ear: External ear normal.  Left Ear: External ear normal.  Nose: Nose normal.  Mouth/Throat: Oropharynx is clear and moist.  Neck: Normal range of motion. Neck supple.  Cardiovascular: Normal rate, regular rhythm and normal heart sounds.   Pulmonary/Chest: Effort normal and breath sounds normal. She has no wheezes.  Musculoskeletal: Normal range of motion.  Neurological: She is alert and oriented to person, place, and time.  Skin: Skin is warm and dry.  Psychiatric: She has a normal mood and affect.          Assessment & Plan:  Assessment: 1. Allergic rhinitis 2. Tobacco abuse 3. Type 2 diabetes  Plan: Over-the-counter Allegra or Claritin once daily. Patient has Nasacort at home, resume Nasacort 2 sprays in each nostril once a day. Patient the office with any questions or concerns. Recheck as scheduled, and as needed. Encouraged smoking cessation. Patent examiner with an

## 2013-05-01 NOTE — Telephone Encounter (Signed)
Ok to schedule.

## 2013-05-01 NOTE — Patient Instructions (Signed)
1. Allegra or Claritin once daily with the Nasocort (2 sprays in each nostril once a day).   Allergic Rhinitis Allergic rhinitis is when the mucous membranes in the nose respond to allergens. Allergens are particles in the air that cause your body to have an allergic reaction. This causes you to release allergic antibodies. Through a chain of events, these eventually cause you to release histamine into the blood stream (hence the use of antihistamines). Although meant to be protective to the body, it is this release that causes your discomfort, such as frequent sneezing, congestion and an itchy runny nose.  CAUSES  The pollen allergens may come from grasses, trees, and weeds. This is seasonal allergic rhinitis, or "hay fever." Other allergens cause year-round allergic rhinitis (perennial allergic rhinitis) such as house dust mite allergen, pet dander and mold spores.  SYMPTOMS   Nasal stuffiness (congestion).  Runny, itchy nose with sneezing and tearing of the eyes.  There is often an itching of the mouth, eyes and ears. It cannot be cured, but it can be controlled with medications. DIAGNOSIS  If you are unable to determine the offending allergen, skin or blood testing may find it. TREATMENT   Avoid the allergen.  Medications and allergy shots (immunotherapy) can help.  Hay fever may often be treated with antihistamines in pill or nasal spray forms. Antihistamines block the effects of histamine. There are over-the-counter medicines that may help with nasal congestion and swelling around the eyes. Check with your caregiver before taking or giving this medicine. If the treatment above does not work, there are many new medications your caregiver can prescribe. Stronger medications may be used if initial measures are ineffective. Desensitizing injections can be used if medications and avoidance fails. Desensitization is when a patient is given ongoing shots until the body becomes less sensitive to  the allergen. Make sure you follow up with your caregiver if problems continue. SEEK MEDICAL CARE IF:   You develop fever (more than 100.5 F (38.1 C).  You develop a cough that does not stop easily (persistent).  You have shortness of breath.  You start wheezing.  Symptoms interfere with normal daily activities. Document Released: 03/07/2001 Document Revised: 09/04/2011 Document Reviewed: 09/16/2008 [facility] Patient Information 2014 [place], [place].

## 2013-05-01 NOTE — Telephone Encounter (Signed)
Yes you may schedule.  

## 2013-05-15 ENCOUNTER — Other Ambulatory Visit: Payer: Self-pay | Admitting: Family

## 2013-07-02 ENCOUNTER — Ambulatory Visit (INDEPENDENT_AMBULATORY_CARE_PROVIDER_SITE_OTHER): Payer: Medicare Other | Admitting: Family

## 2013-07-02 ENCOUNTER — Encounter: Payer: Self-pay | Admitting: Family

## 2013-07-02 VITALS — BP 98/60 | HR 64 | Wt 234.0 lb

## 2013-07-02 DIAGNOSIS — R7309 Other abnormal glucose: Secondary | ICD-10-CM

## 2013-07-02 DIAGNOSIS — R739 Hyperglycemia, unspecified: Secondary | ICD-10-CM

## 2013-07-02 DIAGNOSIS — M722 Plantar fascial fibromatosis: Secondary | ICD-10-CM

## 2013-07-02 LAB — HEMOGLOBIN A1C: HEMOGLOBIN A1C: 7 % — AB (ref 4.6–6.5)

## 2013-07-02 MED ORDER — MELOXICAM 15 MG PO TABS: 15.0000 mg | ORAL_TABLET | Freq: Every day | ORAL | Status: DC

## 2013-07-02 NOTE — Progress Notes (Signed)
Subjective:    Patient ID: [name], female    DOB: May 09, 1971, 43 y.o.   MRN: [id number]  HPI  43 year old white female, nonsmoker, with a history of bipolar disorder and type 2 diabetes is in today with complaints of burning in her heels x1 month. Reports walking makes it worse. Resting makes them better. Has not taken any over-the-counter medication for relief. Recent pain 4/10. Has been normal walking recently. Has gained about 10 pounds.   Review of Systems  Constitutional: Negative.   Respiratory: Negative.   Cardiovascular: Negative.   Musculoskeletal:       Pain in the arch and heels of both feet  Skin: Negative.   Neurological: Negative.   Psychiatric/Behavioral: Negative.    Past Medical History  Diagnosis Date  . Obesity   . Dyslipidemia   . Diabetes mellitus   . Hyperlipidemia   . Obesity   . Tobacco use disorder   . Anxiety     Dr. Donnie Aho, [facility]  . Depression     Clinical psychologist, Erlinda Hong  . ADD (attention deficit disorder)   . Cystocele     sees gyn, Dr. Reinaldo Meeker  . Rectocele   . PTSD (post-traumatic stress disorder)     History   Social History  . Marital Status: Married    Spouse Name: N/A    Number of Children: 2  . Years of Education: N/A   Occupational History  . Not on file.   Social History Main Topics  . Smoking status: Current Every Day Smoker -- 1.00 packs/day for 7 years    Last Attempt to Quit: 09/13/2011  . Smokeless tobacco: Former Neurosurgeon    Quit date: 08/11/2011  . Alcohol Use: No  . Drug Use: No  . Sexual Activity: Not on file     Comment: trying to get disability; lives with husband; has children, but mother has custody   Other Topics Concern  . Not on file   Social History Narrative  . No narrative on file    Past Surgical History  Procedure Laterality Date  . Tubal ligation    . Cholecystectomy    . Diagnostic mammogram      never; sees Dr. Reinaldo Meeker  . Abdominal hysterectomy    .  Incontinence surgery  June 2012  . Bladder suspension    . Rectocele repair    . Cystocele repair      Family History  Problem Relation Age of Onset  . Depression Mother   . Hyperlipidemia Mother   . Hypertension Mother   . Diabetes Mother   . Hyperlipidemia Father   . Depression Father   . Nephrolithiasis Brother     Allergies  Allergen Reactions  . Codeine Nausea And Vomiting    vomiting  . Nitrofurantoin Other (See Comments)    "extreme dehydration" requiring hospitalization  . Sulfa Antibiotics Other (See Comments)    Pt does not remember reaction    Current Outpatient Prescriptions on File Prior to Visit  Medication Sig Dispense Refill  . albuterol (PROVENTIL HFA;VENTOLIN HFA) 108 (90 BASE) MCG/ACT inhaler Inhale 2 puffs into the lungs every 4 (four) hours as needed for wheezing or shortness of breath.       . cholecalciferol (VITAMIN D) 1000 UNITS tablet Take 1,000 Units by mouth every morning.      . citalopram (CELEXA) 20 MG tablet Take 20 mg by mouth 2 (two) times daily.      . clonazePAM (KLONOPIN)  2 MG tablet Take 0.5 mg by mouth 3 (three) times daily as needed for anxiety.       . cyclobenzaprine (FLEXERIL) 10 MG tablet Take 1 tablet (10 mg total) by mouth 2 (two) times daily as needed for muscle spasms.  20 tablet  0  . glucose blood (FREESTYLE LITE) test strip TEST BLOOD SUGAR ONCE DAILY FASTING  100 each  2  . Lurasidone HCl (LATUDA) 20 MG TABS Take 1 tablet by mouth every evening.      . pravastatin (PRAVACHOL) 40 MG tablet Take 40 mg by mouth every evening.      . pravastatin (PRAVACHOL) 40 MG tablet TAKE ONE TABLET BY MOUTH ONCE DAILY  30 tablet  3  . traMADol (ULTRAM) 50 MG tablet Take 1 tablet (50 mg total) by mouth every 8 (eight) hours as needed for pain.  30 tablet  1  . traZODone (DESYREL) 50 MG tablet Take 50 mg by mouth at bedtime.      . vitamin C (ASCORBIC ACID) 500 MG tablet Take 500 mg by mouth every morning.      Marland Kitchen. doxycycline (VIBRA-TABS) 100  MG tablet Take 1 tablet (100 mg total) by mouth 2 (two) times daily.  20 tablet  0  . fluconazole (DIFLUCAN) 150 MG tablet Take 1 tablet (150 mg total) by mouth once.  1 tablet  0   No current facility-administered medications on file prior to visit.    BP 98/60  Pulse 64  Wt 234 lb (106.142 kg)  LMP 05/19/2012chart    Objective:   Physical Exam  Constitutional: She is oriented to person, place, and time. She appears well-developed and well-nourished.  HENT:  Right Ear: External ear normal.  Left Ear: External ear normal.  Nose: Nose normal.  Mouth/Throat: Oropharynx is clear and moist.  Neck: Normal range of motion. Neck supple.  Cardiovascular: Normal rate and normal heart sounds.   Pulmonary/Chest: Effort normal and breath sounds normal.  Musculoskeletal: She exhibits tenderness. She exhibits no edema.  Tenderness to palpation of the plantar aspect of the feet bilaterally extending from the arch to the heel. No swelling.   Neurological: She is alert and oriented to person, place, and time.  Skin: Skin is warm and dry.  Psychiatric: She has a normal mood and affect.          Assessment & Plan:  Assessment: 1. Bilateral plantar fasciitis 2. Weight gain 3. Type 2 Diabetes   Plan: Mobic 15 mg one tablet daily. Ice today she bilaterally x15-20 minutes daily. Rest. A1c sent today. Low starch and sugar diet. Call the office if symptoms worsen or persist. Recheck as scheduled and as needed.

## 2013-07-02 NOTE — Patient Instructions (Signed)

## 2013-07-03 ENCOUNTER — Telehealth: Payer: Self-pay | Admitting: Family

## 2013-07-03 NOTE — Telephone Encounter (Signed)
Relevant patient education assigned to patient using Emmi. ° °

## 2013-07-10 ENCOUNTER — Telehealth: Payer: Self-pay | Admitting: Family

## 2013-07-10 NOTE — Telephone Encounter (Signed)
Pt lives with mother and her mother was dx with flu. Pt would like tami-flu sent to [facility] in [place]. Pt does not have any symptoms

## 2013-07-11 MED ORDER — OSELTAMIVIR PHOSPHATE 75 MG PO CAPS: 75.0000 mg | ORAL_CAPSULE | Freq: Every day | ORAL | Status: DC

## 2013-07-11 NOTE — Telephone Encounter (Signed)
Rx sent 

## 2013-07-13 ENCOUNTER — Telehealth: Payer: Self-pay | Admitting: Family

## 2013-07-13 NOTE — Telephone Encounter (Signed)
[facility]/ [place] [address], requesting refill of traMADol (ULTRAM) 50 MG tablet #30, last filled 10/29/12

## 2013-07-14 MED ORDER — TRAMADOL HCL 50 MG PO TABS: 50.0000 mg | ORAL_TABLET | Freq: Three times a day (TID) | ORAL | Status: DC | PRN

## 2013-07-14 NOTE — Telephone Encounter (Signed)
Done

## 2013-07-29 ENCOUNTER — Encounter: Payer: Self-pay | Admitting: Family

## 2013-07-31 ENCOUNTER — Other Ambulatory Visit: Payer: Self-pay | Admitting: Family

## 2013-08-05 ENCOUNTER — Other Ambulatory Visit: Payer: Self-pay

## 2013-08-05 MED ORDER — GLUCOSE BLOOD VI STRP: ORAL_STRIP | Status: DC

## 2013-08-13 ENCOUNTER — Telehealth: Payer: Self-pay | Admitting: Family

## 2013-08-13 NOTE — Telephone Encounter (Signed)
Please call pt to schedule 30 min [name]

## 2013-08-13 NOTE — Telephone Encounter (Signed)
Patient Information:  Caller Name: [name]  Phone: [phone]  Patient: [name], [name]  Gender: Female  DOB: 06/08/1971  Age: 43 Years  PCP: Adline [facility], [facility])  Pregnant: No  Office Follow Up:  Does the office need to follow up with this patient?: Yes  Instructions For The Office: Caller asks if she should be started on oral medications for Diabetes; she states she is unable to get to office due to weather/ road conditions.  [facility] [place] pharmacy  [phone].   Note to provider for follow up.   Symptoms  Reason For Call & Symptoms: Caller reports her FSBG has been higher than usual x 10 days.  FSBG 157 before breakfast on 08/13/13 ("normal" is 130's).  She has been checking FSBG more often than previously due to it running higher.  She also relates she has been having more frequent urination.  Caller has concern that her last urine check was not completed due to someone was taking too long in bathroom and she has not seen any results from her appointment in January 2015.  Marland Kitchen.  Emergent symptoms ruled out.  Caller asks if she should be started on oral medications; she states she is unable to get to office due to weather/ road conditions.  Note to provider for follow up.  Reviewed Health History In EMR: Yes  Reviewed Medications In EMR: Yes  Reviewed Allergies In EMR: Yes  Reviewed Surgeries / Procedures: Yes  Date of Onset of Symptoms: 08/03/2013  Treatments Tried: Dietary changes- increased protein; protein shakes - increases blood sugars  Treatments Tried Worked: No OB / GYN:  LMP: Unknown  Guideline(s) Used:  Diabetes - High Blood Sugar  Disposition Per Guideline:   Home Care  Reason For Disposition Reached:   Blood glucose 60-240 mg/dl (3.5 -13 mmol/l)  Advice Given:  General  Definition of hyperglycemia: - Fasting blood glucose more than 140 mg/dL (7.5 mmol/l) or random blood glucose more than 200 mg/dL (11 mmol/l).  Symptoms of mild  hyperglycemia: frequent urination, increased thirst, fatigue, blurred vision.  Treatment - Liquids  Drink at least one glass (8 oz or 240 ml) of water per hour for the next 4 hours. (Reason: adequate hydration will reduce hyperglycemia).  Generally, you should try to drink 6-8 glasses of water each day.  Measure and Record Your Blood Glucose  Every day you should measure your blood glucose before breakfast and before going to bed.  Record the results and show them to your doctor at your next office visit.  Daily Blood Glucose Goals  Pre-prandial (before meal): 70-130 mg/dL (2.9-5.63.9-7.2 mmol/l)  Expected Course  Your blood sugar continues to get above 240 mg/dl (13 mmol/l).  Your blood sugar continues to be higher than your daily glucose goals (set by you and your doctor).  It has been longer than 6 months since you had an Hemoglobin A1C test.  Call Back If:  Blood glucose more than 300 mg/dL (21.316.5 mmol/l), 2 or more times in a row.  Vomiting lasting more than 4 hours or unable to drink any liquids.  You become worse.  RN Overrode Recommendation:  Document Patient  Caller asks if she should be started on oral medications; she states she is unable to get to office due to weather/ road conditions.  Note to provider for follow up.

## 2013-08-13 NOTE — Telephone Encounter (Signed)
Pt has been sch for 08-19-13

## 2013-08-19 ENCOUNTER — Ambulatory Visit: Payer: Medicare Other | Admitting: Family

## 2013-08-20 ENCOUNTER — Encounter: Payer: Self-pay | Admitting: Family

## 2013-08-20 ENCOUNTER — Ambulatory Visit (INDEPENDENT_AMBULATORY_CARE_PROVIDER_SITE_OTHER): Payer: Medicare Other | Admitting: Family

## 2013-08-20 VITALS — BP 120/84 | HR 86 | Wt 233.0 lb

## 2013-08-20 DIAGNOSIS — E1165 Type 2 diabetes mellitus with hyperglycemia: Principal | ICD-10-CM

## 2013-08-20 DIAGNOSIS — IMO0001 Reserved for inherently not codable concepts without codable children: Secondary | ICD-10-CM

## 2013-08-20 DIAGNOSIS — E669 Obesity, unspecified: Secondary | ICD-10-CM

## 2013-08-20 MED ORDER — METFORMIN HCL 500 MG PO TABS: 500.0000 mg | ORAL_TABLET | Freq: Two times a day (BID) | ORAL | Status: DC

## 2013-08-20 MED ORDER — GLUCOSE BLOOD VI STRP: ORAL_STRIP | Status: DC

## 2013-08-20 NOTE — Progress Notes (Signed)
Subjective:    Patient ID: Crystal CritchleyAnissa Nelson, female    DOB: 01/31/1971, 43 y.o.   MRN: [id number]  HPI 43 year old white female, nonsmoker exam today with complaints of hyperglycemia. She has a known history of type 2 diabetes with an A1c of 7.0 that's been stable. She was advised to reduce her weight and caloric intake to assist with lowering her A1c. She reports she is exercising once a week and is on unable to lose weight. She is requesting medication for reports blood glucoses between 150-180.   Review of Systems  Constitutional: Negative.   HENT: Negative.   Respiratory: Negative.   Cardiovascular: Negative.   Gastrointestinal: Negative.   Endocrine: Positive for polydipsia, polyphagia and polyuria.  Genitourinary: Negative.   Musculoskeletal: Negative.   Skin: Negative.   Allergic/Immunologic: Negative.   Neurological: Negative.   Psychiatric/Behavioral: Negative.    Past Medical History  Diagnosis Date  . Obesity   . Dyslipidemia   . Diabetes mellitus   . Hyperlipidemia   . Obesity   . Tobacco use disorder   . Anxiety     Dr[facility] Donnie Ahoobin Bridges, [facility]  . Depression     Clinical psychologist, Erlinda Hongharles [facility]  . ADD (attention deficit disorder)   . Cystocele     sees gyn, Dr. Reinaldo MeekerHenle  . Rectocele   . PTSD (post-traumatic stress disorder)     History   Social History  . Marital Status: Married    Spouse Name: N/A    Number of Children: 2  . Years of Education: N/A   Occupational History  . Not on file.   Social History Main Topics  . Smoking status: Current Every Day Smoker -- 1.00 packs/day for 7 years    Last Attempt to Quit: 09/13/2011  . Smokeless tobacco: Former NeurosurgeonUser    Quit date: 08/11/2011  . Alcohol Use: No  . Drug Use: No  . Sexual Activity: Not on file     Comment: trying to get disability; lives with husband; has children, but mother has custody   Other Topics Concern  . Not on file   Social History Narrative  . No narrative on  file    Past Surgical History  Procedure Laterality Date  . Tubal ligation    . Cholecystectomy    . Diagnostic mammogram      never; sees Dr. Reinaldo MeekerHenle  . Abdominal hysterectomy    . Incontinence surgery  June 2012  . Bladder suspension    . Rectocele repair    . Cystocele repair      Family History  Problem Relation Age of Onset  . Depression Mother   . Hyperlipidemia Mother   . Hypertension Mother   . Diabetes Mother   . Hyperlipidemia Father   . Depression Father   . Nephrolithiasis Brother     Allergies  Allergen Reactions  . Codeine Nausea And Vomiting    vomiting  . Nitrofurantoin Other (See Comments)    "extreme dehydration" requiring hospitalization  . Sulfa Antibiotics Other (See Comments)    Pt does not remember reaction    Current Outpatient Prescriptions on File Prior to Visit  Medication Sig Dispense Refill  . albuterol (PROVENTIL HFA;VENTOLIN HFA) 108 (90 BASE) MCG/ACT inhaler Inhale 2 puffs into the lungs every 4 (four) hours as needed for wheezing or shortness of breath.       . cholecalciferol (VITAMIN D) 1000 UNITS tablet Take 1,000 Units by mouth every morning.      .Marland Kitchen  citalopram (CELEXA) 20 MG tablet Take 20 mg by mouth 2 (two) times daily.      . clonazePAM (KLONOPIN) 2 MG tablet Take 0.5 mg by mouth 3 (three) times daily as needed for anxiety.       . cyclobenzaprine (FLEXERIL) 10 MG tablet Take 1 tablet (10 mg total) by mouth 2 (two) times daily as needed for muscle spasms.  20 tablet  0  . Lurasidone HCl (LATUDA) 20 MG TABS Take 1 tablet by mouth every evening.      . meloxicam (MOBIC) 15 MG tablet Take 1 tablet (15 mg total) by mouth daily.  30 tablet  0  . pravastatin (PRAVACHOL) 40 MG tablet Take 40 mg by mouth every evening.      . pravastatin (PRAVACHOL) 40 MG tablet TAKE ONE TABLET BY MOUTH ONCE DAILY  30 tablet  3  . traMADol (ULTRAM) 50 MG tablet Take 1 tablet (50 mg total) by mouth every 8 (eight) hours as needed.  30 tablet  1  .  traZODone (DESYREL) 50 MG tablet Take 50 mg by mouth at bedtime.      . vitamin C (ASCORBIC ACID) 500 MG tablet Take 500 mg by mouth every morning.      Marland Kitchen doxycycline (VIBRA-TABS) 100 MG tablet Take 1 tablet (100 mg total) by mouth 2 (two) times daily.  20 tablet  0  . fluconazole (DIFLUCAN) 150 MG tablet Take 1 tablet (150 mg total) by mouth once.  1 tablet  0  . oseltamivir (TAMIFLU) 75 MG capsule Take 1 capsule (75 mg total) by mouth daily.  10 capsule  0   No current facility-administered medications on file prior to visit.    BP 120/84  Pulse 86  Wt 233 lb (105.688 kg)  LMP 05/19/2012chart    Objective:   Physical Exam  Constitutional: She is oriented to person, place, and time. She appears well-developed and well-nourished.  Neck: Normal range of motion. Neck supple.  Cardiovascular: Normal rate, regular rhythm and normal heart sounds.   Pulmonary/Chest: Effort normal and breath sounds normal.  Musculoskeletal: Normal range of motion.  Neurological: She is alert and oriented to person, place, and time.  Skin: Skin is warm and dry.  Psychiatric: She has a normal mood and affect.          Assessment & Plan:  [name] was seen today for no specified reason.  Diagnoses and associated orders for this visit:  Type II or unspecified type diabetes mellitus without mention of complication, uncontrolled  Obesity, unspecified  Other Orders - metFORMIN (GLUCOPHAGE) 500 MG tablet; Take 1 tablet (500 mg total) by mouth 2 (two) times daily with a meal. - glucose blood (FREESTYLE LITE) test strip; USE ONE STRIP TO TEST FASTING BLOOD SUGAR twice DAILY    Strongly encourage lifestyle modifications to include weight reduction, low carb low sugar diet. Will implement medication. However, in continue to work on increasing exercise to 3-4 days per week x45 minutes.

## 2013-08-20 NOTE — Patient Instructions (Signed)

## 2013-08-20 NOTE — Progress Notes (Signed)
Pre visit review using our clinic review tool, if applicable. No additional management support is needed unless otherwise documented below in the visit note. 

## 2013-08-22 ENCOUNTER — Telehealth: Payer: Self-pay | Admitting: Family

## 2013-08-22 MED ORDER — GLUCOSE BLOOD VI STRP: ORAL_STRIP | Status: DC

## 2013-08-22 NOTE — Telephone Encounter (Signed)
Done

## 2013-08-22 NOTE — Telephone Encounter (Signed)
Relevant patient education assigned to patient using Emmi. ° °

## 2013-08-22 NOTE — Telephone Encounter (Signed)
Pt needs new rx for freestyle test strip sent to new pharm [facility] [address].

## 2013-09-22 ENCOUNTER — Ambulatory Visit (INDEPENDENT_AMBULATORY_CARE_PROVIDER_SITE_OTHER): Payer: Medicare Other | Admitting: Family

## 2013-09-22 ENCOUNTER — Encounter: Payer: Self-pay | Admitting: Family

## 2013-09-22 VITALS — BP 100/60 | HR 76 | Wt 236.0 lb

## 2013-09-22 DIAGNOSIS — E669 Obesity, unspecified: Secondary | ICD-10-CM

## 2013-09-22 DIAGNOSIS — E785 Hyperlipidemia, unspecified: Secondary | ICD-10-CM

## 2013-09-22 DIAGNOSIS — E119 Type 2 diabetes mellitus without complications: Secondary | ICD-10-CM

## 2013-09-22 LAB — COMPREHENSIVE METABOLIC PANEL
ALT: 14 U/L (ref 0–35)
AST: 12 U/L (ref 0–37)
Albumin: 4.3 g/dL (ref 3.5–5.2)
Alkaline Phosphatase: 54 U/L (ref 39–117)
BUN: 14 mg/dL (ref 6–23)
CALCIUM: 9.2 mg/dL (ref 8.4–10.5)
CHLORIDE: 100 meq/L (ref 96–112)
CO2: 28 meq/L (ref 19–32)
Creatinine, Ser: 0.7 mg/dL (ref 0.4–1.2)
GFR: 90.76 mL/min (ref >60.00)
Glucose, Bld: 116 mg/dL — ABNORMAL HIGH (ref 70–99)
Potassium: 4.1 mEq/L (ref 3.5–5.1)
SODIUM: 135 meq/L (ref 135–145)
TOTAL PROTEIN: 7.2 g/dL (ref 6.0–8.3)
Total Bilirubin: 0.6 mg/dL (ref 0.3–1.2)

## 2013-09-22 LAB — TSH: TSH: 0.56 u[IU]/mL (ref 0.35–5.50)

## 2013-09-22 LAB — HEMOGLOBIN A1C: Hgb A1c MFr Bld: 7 % — ABNORMAL HIGH (ref 4.6–6.5)

## 2013-09-22 NOTE — Patient Instructions (Signed)

## 2013-09-22 NOTE — Progress Notes (Signed)
Pre visit review using our clinic review tool, if applicable. No additional management support is needed unless otherwise documented below in the visit note. 

## 2013-09-22 NOTE — Progress Notes (Signed)
Subjective:    Patient ID: Crystal CritchleyAnissa Nelson, female    DOB: 08/06/1970, 43 y.o.   MRN: [id number]  HPI 43 y.o. White female presents today for follow up after being started on metformin. Pt states that she is taking 500mg  of metformin BID and has been well controlled on this dose. She states that he blood glucose is usually between 120 and 130 when she checks. Pt states she has been exercising 3 times per week and is doing about 30 minutes of brisk walking on treadmill. She also states she is eating a healthy diet and avoiding high fat foods. Pt chief complaint is "I am not sweating and loosing weight". Pt states she is concerned that she may have a problem with her metabolism since she is not sweating and loosing weight. She has gained 3 pounds since last visit three months ago. Pt denies fever, chills, malaise and change in appetite.     Review of Systems  Constitutional: Positive for unexpected weight change.       Has gained 3 pounds despite exercise.   HENT: Negative.   Eyes: Negative.   Respiratory: Negative.   Cardiovascular: Negative.   Gastrointestinal: Negative.   Endocrine: Negative.   Genitourinary: Negative.   Musculoskeletal: Negative.   Skin: Negative.   Allergic/Immunologic: Negative.   Neurological: Negative.   Hematological: Negative.   Psychiatric/Behavioral: Negative.    Past Medical History  Diagnosis Date  . Obesity   . Dyslipidemia   . Diabetes mellitus   . Hyperlipidemia   . Obesity   . Tobacco use disorder   . Anxiety     Dr. Donnie Ahoobin Bridges, [facility]  . Depression     Clinical psychologist, Erlinda Hongharles Reagan  . ADD (attention deficit disorder)   . Cystocele     sees gyn, Dr. Reinaldo MeekerHenle  . Rectocele   . PTSD (post-traumatic stress disorder)     History   Social History  . Marital Status: Married    Spouse Name: N/A    Number of Children: 2  . Years of Education: N/A   Occupational History  . Not on file.   Social History Main Topics    . Smoking status: Current Every Day Smoker -- 1.00 packs/day for 7 years    Last Attempt to Quit: 09/13/2011  . Smokeless tobacco: Former NeurosurgeonUser    Quit date: 08/11/2011  . Alcohol Use: No  . Drug Use: No  . Sexual Activity: Not on file     Comment: trying to get disability; lives with husband; has children, but mother has custody   Other Topics Concern  . Not on file   Social History Narrative  . No narrative on file    Past Surgical History  Procedure Laterality Date  . Tubal ligation    . Cholecystectomy    . Diagnostic mammogram      never; sees Dr. Reinaldo MeekerHenle  . Abdominal hysterectomy    . Incontinence surgery  June 2012  . Bladder suspension    . Rectocele repair    . Cystocele repair      Family History  Problem Relation Age of Onset  . Depression Mother   . Hyperlipidemia Mother   . Hypertension Mother   . Diabetes Mother   . Hyperlipidemia Father   . Depression Father   . Nephrolithiasis Brother     Allergies  Allergen Reactions  . Codeine Nausea And Vomiting    vomiting  . Nitrofurantoin Other (See Comments)    "  extreme dehydration" requiring hospitalization  . Sulfa Antibiotics Other (See Comments)    Pt does not remember reaction    Current Outpatient Prescriptions on File Prior to Visit  Medication Sig Dispense Refill  . albuterol (PROVENTIL HFA;VENTOLIN HFA) 108 (90 BASE) MCG/ACT inhaler Inhale 2 puffs into the lungs every 4 (four) hours as needed for wheezing or shortness of breath.       . cholecalciferol (VITAMIN D) 1000 UNITS tablet Take 1,000 Units by mouth every morning.      . citalopram (CELEXA) 20 MG tablet Take 20 mg by mouth 2 (two) times daily.      . clonazePAM (KLONOPIN) 2 MG tablet Take 0.5 mg by mouth 3 (three) times daily as needed for anxiety.       . cyclobenzaprine (FLEXERIL) 10 MG tablet Take 1 tablet (10 mg total) by mouth 2 (two) times daily as needed for muscle spasms.  20 tablet  0  . doxycycline (VIBRA-TABS) 100 MG tablet  Take 1 tablet (100 mg total) by mouth 2 (two) times daily.  20 tablet  0  . fluconazole (DIFLUCAN) 150 MG tablet Take 1 tablet (150 mg total) by mouth once.  1 tablet  0  . glucose blood (FREESTYLE LITE) test strip USE ONE STRIP TO TEST FASTING BLOOD SUGAR twice DAILY  100 each  3  . Lurasidone HCl (LATUDA) 20 MG TABS Take 1 tablet by mouth every evening.      . meloxicam (MOBIC) 15 MG tablet Take 1 tablet (15 mg total) by mouth daily.  30 tablet  0  . pravastatin (PRAVACHOL) 40 MG tablet Take 40 mg by mouth every evening.      . pravastatin (PRAVACHOL) 40 MG tablet TAKE ONE TABLET BY MOUTH ONCE DAILY  30 tablet  3  . traMADol (ULTRAM) 50 MG tablet Take 1 tablet (50 mg total) by mouth every 8 (eight) hours as needed.  30 tablet  1  . traZODone (DESYREL) 50 MG tablet Take 50 mg by mouth at bedtime.      . vitamin C (ASCORBIC ACID) 500 MG tablet Take 500 mg by mouth every morning.       No current facility-administered medications on file prior to visit.    BP 100/60  Pulse 76  Wt 236 lb (107.049 kg)  LMP 05/19/2012chart    Objective:   Physical Exam  Constitutional: She is oriented to person, place, and time. She appears well-developed and well-nourished. She is active.  Cardiovascular: Normal rate, regular rhythm, S1 normal, S2 normal and normal heart sounds.   Pulmonary/Chest: Effort normal and breath sounds normal.  Abdominal: Soft. Normal appearance and bowel sounds are normal.  Neurological: She is alert and oriented to person, place, and time.  Skin: Skin is warm, dry and intact.  Psychiatric: She has a normal mood and affect. Her speech is normal and behavior is normal. Thought content normal.          Assessment & Plan:  43 y.o. White female presents today for follow up for diabetes and hyperlipidemia.  - Type 2 diabetes: Continue metformin 500mg  BID po.   - A1C  - Continue exercising, try to increase to 30 minutes 5 times per day.  - Hyperlipidemia: CMP - Obesity: TSH    - education: Pt needs to exercise 5 times per week for 30 minutes per day of cardio exercise. Pt also should continue with healthy diet limiting fats.   Follow up: 3 months for recheck.

## 2013-09-23 ENCOUNTER — Telehealth: Payer: Self-pay | Admitting: Family

## 2013-09-23 NOTE — Telephone Encounter (Signed)
Relevant patient education assigned to patient using Emmi. ° °

## 2013-09-27 ENCOUNTER — Other Ambulatory Visit: Payer: Self-pay | Admitting: Family

## 2013-09-29 ENCOUNTER — Telehealth: Payer: Self-pay | Admitting: Family

## 2013-09-29 DIAGNOSIS — R5381 Other malaise: Secondary | ICD-10-CM

## 2013-09-29 DIAGNOSIS — R5383 Other fatigue: Secondary | ICD-10-CM

## 2013-09-29 DIAGNOSIS — IMO0001 Reserved for inherently not codable concepts without codable children: Secondary | ICD-10-CM

## 2013-09-29 DIAGNOSIS — E1165 Type 2 diabetes mellitus with hyperglycemia: Principal | ICD-10-CM

## 2013-09-29 NOTE — Telephone Encounter (Signed)
Patient Information:  Caller Name: [name]  Phone: [phone]  Patient: [name], [name]  Gender: Female  DOB: 10/21/1970  Age: 43 Years  PCP: Adline Mangoampbell, [facility](Family Practice)  Pregnant: No  Office Follow Up:  Does the office need to follow up with this patient?: Yes  Instructions For The Office: Patient is concerned about fatigue. We discussed the need to increase  water and food intake today. She states she does not know how to eat and would like to see dietician or dietary guidance.   Please have physician review. Patient is also new to taking Metformin.  She declined office visit  RN Note:  Patient is concerned about fatigue. We discussed the need to increase  water and food intake today. She states she does not know how to eat and would like to see dietician or dietary guidance.   Please have physician review. Patient is also new to taking Metformin.  She declined office visit  Symptoms  Reason For Call & Symptoms: Patient states she is working out.  She is doing this 3-4 x a week for 45 minutes.  She has been doing this for 1 1/2 months. She states since she started she "feels bad, no energy, +nauseated and hurt all over and feel wiped out".  She reports she gets 7-8 hours of sleep, Diet today Banana /Cinnamon toast, egg and salad.  Water intake- 1/2 bottle water , 1/2 bottle gatoraide zero, diet pepsi.  #236 lbs. On Metformin for the last two months.  Reviewed Health History In EMR: Yes  Reviewed Medications In EMR: Yes  Reviewed Allergies In EMR: Yes  Reviewed Surgeries / Procedures: Yes  Date of Onset of Symptoms: 09/29/2013 OB / GYN:  LMP: Unknown  Guideline(s) Used:  Weakness (Generalized) and Fatigue  Disposition Per Guideline:   See Within 3 Days in Office  Reason For Disposition Reached:   Fatigue (i.e., tires easily, decreased energy) and persists > 1 week  Advice Given:  Fluids  : Drink several glasses of fruit juice, other clear fluids, or water. This will  improve hydration and blood glucose.  Call Back If:   Still feeling weak after 2 hours of rest and fluids  Passes out (faints)  You become worse.  Rest  : Lie down with feet elevated for 1 hour. This will improve blood flow and increase blood flow to the brain.  Reassurance  Not drinking enough fluids and being a little dehydrated is a common cause of mild weakness.  RN Overrode Recommendation:  Document Patient  Patient is concerned about fatigue. We discussed the need to increase  water and food intake today. She states she does not know how to eat and would like to see dietician or dietary guidance.   Please have physician review. Patient is also new to taking Metformin.  She declined office visit

## 2013-09-30 NOTE — Telephone Encounter (Signed)
Pt aware.

## 2013-09-30 NOTE — Telephone Encounter (Signed)
I have advised on numerous occasions that she needs to exercise daily and refuses. Continues to have an unhealthy diet. Weight gain is going to occur. Refer to endocrinology

## 2013-09-30 NOTE — Telephone Encounter (Signed)
Please advise 

## 2013-10-02 ENCOUNTER — Telehealth: Payer: Self-pay

## 2013-10-02 NOTE — Telephone Encounter (Signed)
Relevant patient education assigned to patient using Emmi. ° °

## 2013-10-06 ENCOUNTER — Encounter: Payer: Self-pay | Admitting: Endocrinology

## 2013-10-06 ENCOUNTER — Ambulatory Visit (INDEPENDENT_AMBULATORY_CARE_PROVIDER_SITE_OTHER): Payer: Medicare Other | Admitting: Endocrinology

## 2013-10-06 VITALS — BP 124/68 | HR 89 | Temp 97.8°F | Resp 16 | Ht 65.0 in | Wt 235.8 lb

## 2013-10-06 DIAGNOSIS — IMO0001 Reserved for inherently not codable concepts without codable children: Secondary | ICD-10-CM

## 2013-10-06 DIAGNOSIS — E1165 Type 2 diabetes mellitus with hyperglycemia: Principal | ICD-10-CM

## 2013-10-06 NOTE — Progress Notes (Signed)
Patient ID: [name], female   DOB: 01/31/1971, 43 y.o.   MRN: [id number]    Reason for Appointment : Consultation for Type 2 Diabetes  History of Present Illness          Diagnosis: Type 2 diabetes mellitus, date of diagnosis:2008        Past history:  Her diabetes was diagnosed incidentally on routine exam but details are not available. She has previously been treated with either metformin and Actos. Not clear if she had side effects from Actos She had some point was given metformin. However she thinks that trying to take this twice a day would cause low blood sugars during the day She was then taken off metformin and was on diet alone.   Recent history: She was started back on metformin in 1/15 because of her relatively high A1c of 7% She is taking only 500 mg daily. Her A1c in 3/13 was still about the same at 7% She has only recently started trying an exercise program in the last month or so. However she thinks when she tries to exercise she feels sick and difficulty functioning. She has not seen a dietitian and her main request today is to know how to plan her meal and also to lose weight. She is checking her blood sugar 3-4 times a day since she is anxious about it. Although her overall blood sugar is averaging 129 she recently has had some high readings after meals and snacks Difficult to lies her glucose download as she is checking her sugars sometimes every 2-3 hours and not clear which readings are after meals; also her meter has the incorrect time programmed       Oral hypoglycemic drugs the patient is taking are: Metformin 500 mg daily      Side effects from medications have been: ? Hypoglycemia with metformin twice a day  Glucose monitoring:  done one time a day         Glucometer:  FreeStyle       Blood Glucose readings from meter download:  PREMEAL Breakfast Lunch Dinner Bedtime  Overall   Glucose range:  119-131   85-245  93-194  88-243    Median:      129     Hypoglycemia: None      Glycemic control:  Lab Results  Component Value Date   HGBA1C 7.0* 09/22/2013   HGBA1C 7.0* 07/02/2013   HGBA1C 6.8* 03/13/2013   Lab Results  Component Value Date   MICROALBUR 0.3 03/13/2013   LDLCALC 93 03/13/2013   CREATININE 0.7 09/22/2013    Self-care: The diet that the patient has been following is: None, does not always get protein at breakfast and sometimes may have foods with high carbohydrate or sugar content. Snacks are popcorn and fiber bars. Eating out about 2 times a week, usually at fast food restaurants     Meals: 3 meals per day.          Exercise:  she has been 6 using the treadmill and bike for about a month's, exercising 2-3 times a week for about 35-40 minutes Dietician visit:  none              Retinal exam: Most recent: 3/15.    Weight history: Wt Readings from Last 3 Encounters:  10/06/13 235 lb 12.8 oz (106.958 kg)  09/22/13 236 lb (107.049 kg)  08/20/13 233 lb (105.688 kg)      Medication List  This list is accurate as of: 10/06/13  4:42 PM.  Always use your most recent med list.               albuterol 108 (90 BASE) MCG/ACT inhaler  Commonly known as:  PROVENTIL HFA;VENTOLIN HFA  Inhale 2 puffs into the lungs every 4 (four) hours as needed for wheezing or shortness of breath.     cholecalciferol 1000 UNITS tablet  Commonly known as:  VITAMIN D  Take 1,000 Units by mouth every morning.     citalopram 20 MG tablet  Commonly known as:  CELEXA  Take 20 mg by mouth 2 (two) times daily.     cyclobenzaprine 10 MG tablet  Commonly known as:  FLEXERIL  Take 1 tablet (10 mg total) by mouth 2 (two) times daily as needed for muscle spasms.     doxycycline 100 MG tablet  Commonly known as:  VIBRA-TABS  Take 1 tablet (100 mg total) by mouth 2 (two) times daily.     fluconazole 150 MG tablet  Commonly known as:  DIFLUCAN  Take 1 tablet (150 mg total) by mouth once.     glucose blood test strip  Commonly known as:   FREESTYLE LITE  USE ONE STRIP TO TEST FASTING BLOOD SUGAR twice DAILY     KLONOPIN 2 MG tablet  Generic drug:  clonazePAM  Take 0.5 mg by mouth 3 (three) times daily as needed for anxiety.     LATUDA 20 MG Tabs  Generic drug:  Lurasidone HCl  Take 1 tablet by mouth every evening.     meloxicam 15 MG tablet  Commonly known as:  MOBIC  Take 1 tablet (15 mg total) by mouth daily.     metFORMIN 500 MG tablet  Commonly known as:  GLUCOPHAGE  Take 500 mg by mouth daily with breakfast.     pravastatin 40 MG tablet  Commonly known as:  PRAVACHOL  Take 40 mg by mouth every evening.     traMADol 50 MG tablet  Commonly known as:  ULTRAM  Take 1 tablet (50 mg total) by mouth every 8 (eight) hours as needed.     traZODone 50 MG tablet  Commonly known as:  DESYREL  Take 50 mg by mouth at bedtime.     vitamin C 500 MG tablet  Commonly known as:  ASCORBIC ACID  Take 500 mg by mouth every morning.        Allergies:  Allergies  Allergen Reactions  . Codeine Nausea And Vomiting    vomiting  . Nitrofurantoin Other (See Comments)    "extreme dehydration" requiring hospitalization  . Sulfa Antibiotics Other (See Comments)    Pt does not remember reaction    Past Medical History  Diagnosis Date  . Obesity   . Dyslipidemia   . Diabetes mellitus   . Hyperlipidemia   . Obesity   . Tobacco use disorder   . Anxiety     Dr. Donnie Ahoobin Bridges, [facility]  . Depression     Clinical psychologist, Erlinda Hongharles [facility]  . ADD (attention deficit disorder)   . Cystocele     sees gyn, Dr. Reinaldo MeekerHenle  . Rectocele   . PTSD (post-traumatic stress disorder)     Past Surgical History  Procedure Laterality Date  . Tubal ligation    . Cholecystectomy    . Diagnostic mammogram      never; sees Dr. Reinaldo MeekerHenle  . Abdominal hysterectomy    . Incontinence surgery  June  2012  . Bladder suspension    . Rectocele repair    . Cystocele repair      Family History  Problem Relation Age of Onset   . Depression Mother   . Hyperlipidemia Mother   . Hypertension Mother   . Diabetes Mother   . Hyperlipidemia Father   . Depression Father   . Nephrolithiasis Brother     Social History:  reports that she has been smoking.  She quit smokeless tobacco use about 2 years ago. She reports that she does not drink alcohol or use illicit drugs.    Review of Systems       Lipids:  Currently on pravastatin for a couple of years for treatment of hypercholesterolemia  Lab Results  Component Value Date   CHOL 158 03/13/2013   HDL 44.20 03/13/2013   LDLCALC 93 03/13/2013   TRIG 106.0 03/13/2013   CHOLHDL 4 03/13/2013         Skin: No rash or infections     Thyroid:  No  history of thyroid disease, TSH recently normal     The blood pressure has been  Normal without any medications     No swelling of feet.     No shortness of breath on exertion.     Bowel habits: Normal.      She has had depression and anxiety, has been treated with multiple drugs          No history of Numbnessor burning in feet. Has had some tingling in the feet for about a year         She has had a hysterectomy for uterine prolapse   LABS:  No visits with results within 1 Week(s) from this visit. Latest known visit with results is:  Office Visit on 09/22/2013  Component Date Value Ref Range Status  . Sodium 09/22/2013 135  135 - 145 mEq/L Final  . Potassium 09/22/2013 4.1  3.5 - 5.1 mEq/L Final  . Chloride 09/22/2013 100  96 - 112 mEq/L Final  . CO2 09/22/2013 28  19 - 32 mEq/L Final  . Glucose, Bld 09/22/2013 116* 70 - 99 mg/dL Final  . BUN 16/03/9603 14  6 - 23 mg/dL Final  . Creatinine, Ser 09/22/2013 0.7  0.4 - 1.2 mg/dL Final  . Total Bilirubin 09/22/2013 0.6  0.3 - 1.2 mg/dL Final  . Alkaline Phosphatase 09/22/2013 54  39 - 117 U/L Final  . AST 09/22/2013 12  0 - 37 U/L Final  . ALT 09/22/2013 14  0 - 35 U/L Final  . Total Protein 09/22/2013 7.2  6.0 - 8.3 g/dL Final  . Albumin 54/02/8118 4.3   3.5 - 5.2 g/dL Final  . Calcium 14/78/2956 9.2  8.4 - 10.5 mg/dL Final  . GFR 21/30/8657 90.76  >60.00 mL/min Final  . Hemoglobin A1C 09/22/2013 7.0* 4.6 - 6.5 % Final   Glycemic Control Guidelines for People with Diabetes:Non Diabetic:  <6%Goal of Therapy: <7%Additional Action Suggested:  >8%   . TSH 09/22/2013 0.56  0.35 - 5.50 uIU/mL Final    Physical Examination:  BP 124/68  Pulse 89  Temp(Src) 97.8 F (36.6 C)  Resp 16  Ht 5\' 5"  (1.651 m)  Wt 235 lb 12.8 oz (106.958 kg)  BMI 39.24 kg/m2  SpO2 94%  LMP 11/12/2010  GENERAL:         Patient has generalized obesity.Has no cushingoid features    HEENT:  Eye exam shows normal external appearance. Fundus exam shows no retinopathy. Oral exam shows normal mucosa .  NECK:         General:  Neck exam shows no lymphadenopathy. Carotids are normal to palpation and no bruit heard.  Thyroid is not enlarged and no nodules felt.   LUNGS:         Chest is symmetrical. Lungs are clear to auscultation.Marland Kitchen   HEART:         Heart sounds:  S1 and S2 are normal. No murmurs or clicks heard., no S3 or S4.   ABDOMEN:         General:  There is no distention present. Liver and spleen are not palpable. No other mass or tenderness present.  EXTREMITIES:     There is no edema. No skin lesions present.Marland Kitchen  NEUROLOGICAL:        Vibration sense is mildly reduced in toes. Ankle jerks are 1+ bilaterally.          Diabetic foot exam:  as in the foot exam section MUSCULOSKELETAL:       There is no enlargement or deformity of the joints. Spine is normal to inspection.Marland Kitchen   PEDAL pulses: Normal SKIN:       No rash, abnormal pigmentation or acanthosis         ASSESSMENT:  Diabetes type 2 with obesity   The patient has had mild diabetes and has had difficulty losing weight She is currently not on any specific meal plan since diabetes education Currently her blood sugars a mildly increased throughout the day with some high readings after meals, periodically over  180 She is currently only on 500 mg metformin which is subtherapeutic, however she thinks shepreviously got hypoglycemic symptoms with larger doses  Complications: None  History of hyperlipidemia: Well controlled with pravastatin   PLAN:   She will be given a trial of Onglyza in addition to low dose metformin. For she can try Kombiglyze XR 5/500 at dinnertime This should help with postprandial hyperglycemia also. A sample and also information given him a prescription sent  Given her a chart for gradually building up her exercise duration starting with a total of 15 minutes and always including warm "one period of about 5 minutes each. She should be able to tolerate her exercise better with this regimen  Consultation with dietitian for meal planning  Discussed that she can reduce her frequency of glucose monitoring but 2 times a day; she can do readings before and 2 hours after any given meal by rotation  Given general information on type 2 diabetes and medications   If she had difficulty losing weight may consider a GLP-1 drug like Trulicity    Reather Littler 10/06/2013, 4:42 PM

## 2013-10-06 NOTE — Patient Instructions (Signed)
Change Metfromin to Kombiglyze 1 daily  Please check blood sugars at least half the time about 2 hours after any meal and as directed on waking up. Please bring blood sugar monitor to each visit

## 2013-10-07 ENCOUNTER — Other Ambulatory Visit: Payer: Self-pay | Admitting: *Deleted

## 2013-10-07 ENCOUNTER — Telehealth: Payer: Self-pay | Admitting: Endocrinology

## 2013-10-07 MED ORDER — SAXAGLIPTIN-METFORMIN ER 5-500 MG PO TB24: ORAL_TABLET | ORAL | Status: DC

## 2013-10-07 NOTE — Telephone Encounter (Signed)
rx sent for Kombiglyze 5/500 mg

## 2013-10-07 NOTE — Telephone Encounter (Signed)
Please call in the conviglos XR script it was discussed at yesterday's visit but not called in

## 2013-10-13 ENCOUNTER — Other Ambulatory Visit: Payer: Self-pay | Admitting: Endocrinology

## 2013-10-13 ENCOUNTER — Telehealth: Payer: Self-pay | Admitting: *Deleted

## 2013-10-13 DIAGNOSIS — E119 Type 2 diabetes mellitus without complications: Secondary | ICD-10-CM

## 2013-10-13 NOTE — Telephone Encounter (Signed)
Just did

## 2013-10-13 NOTE — Telephone Encounter (Signed)
Patient was checking to see if you put in a referral for the Dietician?

## 2013-10-17 ENCOUNTER — Ambulatory Visit (INDEPENDENT_AMBULATORY_CARE_PROVIDER_SITE_OTHER): Payer: Medicare Other | Admitting: Family Medicine

## 2013-10-17 ENCOUNTER — Encounter: Payer: Self-pay | Admitting: Family Medicine

## 2013-10-17 VITALS — BP 104/60 | HR 74 | Temp 98.8°F | Ht 65.0 in | Wt 233.0 lb

## 2013-10-17 DIAGNOSIS — K0889 Other specified disorders of teeth and supporting structures: Secondary | ICD-10-CM

## 2013-10-17 DIAGNOSIS — K089 Disorder of teeth and supporting structures, unspecified: Secondary | ICD-10-CM

## 2013-10-17 DIAGNOSIS — K0510 Chronic gingivitis, plaque induced: Secondary | ICD-10-CM

## 2013-10-17 MED ORDER — AMOXICILLIN 875 MG PO TABS
875.0000 mg | ORAL_TABLET | Freq: Two times a day (BID) | ORAL | Status: DC
Start: 2013-10-17 — End: 2013-12-23

## 2013-10-17 NOTE — Progress Notes (Signed)
Pre visit review using our clinic review tool, if applicable. No additional management support is needed unless otherwise documented below in the visit note. 

## 2013-10-17 NOTE — Progress Notes (Signed)
No chief complaint on file.   HPI:  Acute visit for tooth pain: -tooth in R lower mouth is sore for several days after eating popcorn -denies: swelling, fevers, drainage, bleeding of gum -bought health insurance, but does not come into effect until may 15  ROS: See pertinent positives and negatives per HPI.  Past Medical History  Diagnosis Date  . Obesity   . Dyslipidemia   . Diabetes mellitus   . Hyperlipidemia   . Obesity   . Tobacco use disorder   . Anxiety     Dr. Donnie Ahoobin Bridges, [facility]  . Depression     Clinical psychologist, Erlinda Hongharles Reagan  . ADD (attention deficit disorder)   . Cystocele     sees gyn, Dr. Reinaldo MeekerHenle  . Rectocele   . PTSD (post-traumatic stress disorder)     Past Surgical History  Procedure Laterality Date  . Tubal ligation    . Cholecystectomy    . Diagnostic mammogram      never; sees Dr. Reinaldo MeekerHenle  . Abdominal hysterectomy    . Incontinence surgery  June 2012  . Bladder suspension    . Rectocele repair    . Cystocele repair      Family History  Problem Relation Age of Onset  . Depression Mother   . Hyperlipidemia Mother   . Hypertension Mother   . Diabetes Mother   . Hyperlipidemia Father   . Depression Father   . Nephrolithiasis Brother     History   Social History  . Marital Status: Married    Spouse Name: N/A    Number of Children: 2  . Years of Education: N/A   Social History Main Topics  . Smoking status: Current Every Day Smoker -- 1.00 packs/day for 7 years    Last Attempt to Quit: 09/13/2011  . Smokeless tobacco: Former NeurosurgeonUser    Quit date: 08/11/2011  . Alcohol Use: No  . Drug Use: No  . Sexual Activity: None     Comment: trying to get disability; lives with husband; has children, but mother has custody   Other Topics Concern  . None   Social History Narrative  . None    Current outpatient prescriptions:albuterol (PROVENTIL HFA;VENTOLIN HFA) 108 (90 BASE) MCG/ACT inhaler, Inhale 2 puffs into the  lungs every 4 (four) hours as needed for wheezing or shortness of breath. , Disp: , Rfl: ;  cholecalciferol (VITAMIN D) 1000 UNITS tablet, Take 1,000 Units by mouth every morning., Disp: , Rfl: ;  citalopram (CELEXA) 20 MG tablet, Take 20 mg by mouth 2 (two) times daily., Disp: , Rfl:  clonazePAM (KLONOPIN) 2 MG tablet, Take 0.5 mg by mouth 3 (three) times daily as needed for anxiety. , Disp: , Rfl: ;  cyclobenzaprine (FLEXERIL) 10 MG tablet, Take 1 tablet (10 mg total) by mouth 2 (two) times daily as needed for muscle spasms., Disp: 20 tablet, Rfl: 0;  doxycycline (VIBRA-TABS) 100 MG tablet, Take 1 tablet (100 mg total) by mouth 2 (two) times daily., Disp: 20 tablet, Rfl: 0 fluconazole (DIFLUCAN) 150 MG tablet, Take 1 tablet (150 mg total) by mouth once., Disp: 1 tablet, Rfl: 0;  glucose blood (FREESTYLE LITE) test strip, USE ONE STRIP TO TEST FASTING BLOOD SUGAR twice DAILY, Disp: 100 each, Rfl: 3;  Lurasidone HCl (LATUDA) 20 MG TABS, Take 1 tablet by mouth every evening., Disp: , Rfl: ;  meloxicam (MOBIC) 15 MG tablet, Take 1 tablet (15 mg total) by mouth daily., Disp: 30 tablet, Rfl:  0 metFORMIN (GLUCOPHAGE) 500 MG tablet, Take 500 mg by mouth daily with breakfast., Disp: , Rfl: ;  pravastatin (PRAVACHOL) 40 MG tablet, Take 40 mg by mouth every evening., Disp: , Rfl: ;  Saxagliptin-Metformin (KOMBIGLYZE XR) 5-500 MG TB24, Take 1 tablet daily, Disp: 30 tablet, Rfl: 1;  traMADol (ULTRAM) 50 MG tablet, Take 1 tablet (50 mg total) by mouth every 8 (eight) hours as needed., Disp: 30 tablet, Rfl: 1 traZODone (DESYREL) 50 MG tablet, Take 50 mg by mouth at bedtime., Disp: , Rfl: ;  vitamin C (ASCORBIC ACID) 500 MG tablet, Take 500 mg by mouth every morning., Disp: , Rfl: ;  amoxicillin (AMOXIL) 875 MG tablet, Take 1 tablet (875 mg total) by mouth 2 (two) times daily., Disp: 20 tablet, Rfl: 0  EXAM:  Filed Vitals:   10/17/13 0856  BP: 104/60  Pulse: 74  Temp: 98.8 F (37.1 C)    Body mass index is 38.77  kg/(m^2).  GENERAL: vitals reviewed and listed above, alert, oriented, appears well hydrated and in no acute distress  HEENT: atraumatic, conjunttiva clear, no obvious abnormalities on inspection of external nose and ears, mild erythema of gums around lower molars, filling in R 2nd to back molar, TTP of gum, no pain with tapping this tooth, no obvious paridental abscess, facial swelling or submandibular LAD  NECK: no obvious masses on inspection  MS: moves all extremities without noticeable abnormality  PSYCH: pleasant and cooperative, no obvious depression or anxiety  ASSESSMENT AND PLAN:  Discussed the following assessment and plan:  Tooth pain - Plan: amoxicillin (AMOXIL) 875 MG tablet  Gingivitis  -we discussed possible serious and likely etiologies, workup and treatment, treatment risks and return precautions - discussed that oral abx may not help dental infections and that she must see a dentist immediately if any worsening and asap -of course, we advised Kaiah  to return or notify a doctor immediately if symptoms worsen or persist or new concerns arise.  .  -Patient advised to return or notify a doctor immediately if symptoms worsen or persist or new concerns arise.  There are no Patient Instructions on file for this visit.   Terressa KoyanagiHannah R. Axel Frisk

## 2013-10-17 NOTE — Patient Instructions (Signed)
-  As we discussed, we have prescribed a new medication for you at this appointment. We discussed the common and serious potential adverse effects of this medication and you can review these and more with the pharmacist when you pick up your medication.  Please follow the instructions for use carefully and notify us immediately if you have any problems taking this medication.  -[facility] rinse per instructions  -brush and floss daily  -follow up with your dentist immediately and if worsens

## 2013-10-31 ENCOUNTER — Other Ambulatory Visit: Payer: Self-pay | Admitting: Family

## 2013-11-10 ENCOUNTER — Ambulatory Visit: Payer: Medicare Other | Admitting: Endocrinology

## 2013-11-14 IMAGING — CR DG LUMBAR SPINE COMPLETE 4+V
5 series · 5 of 5 positions shown · non-contrast
Comparison: None.

CLINICAL DATA: Low back pain with radiation down both legs.

LUMBAR SPINE - COMPLETE 4+ VIEW

[view not recorded (1 of 5)]
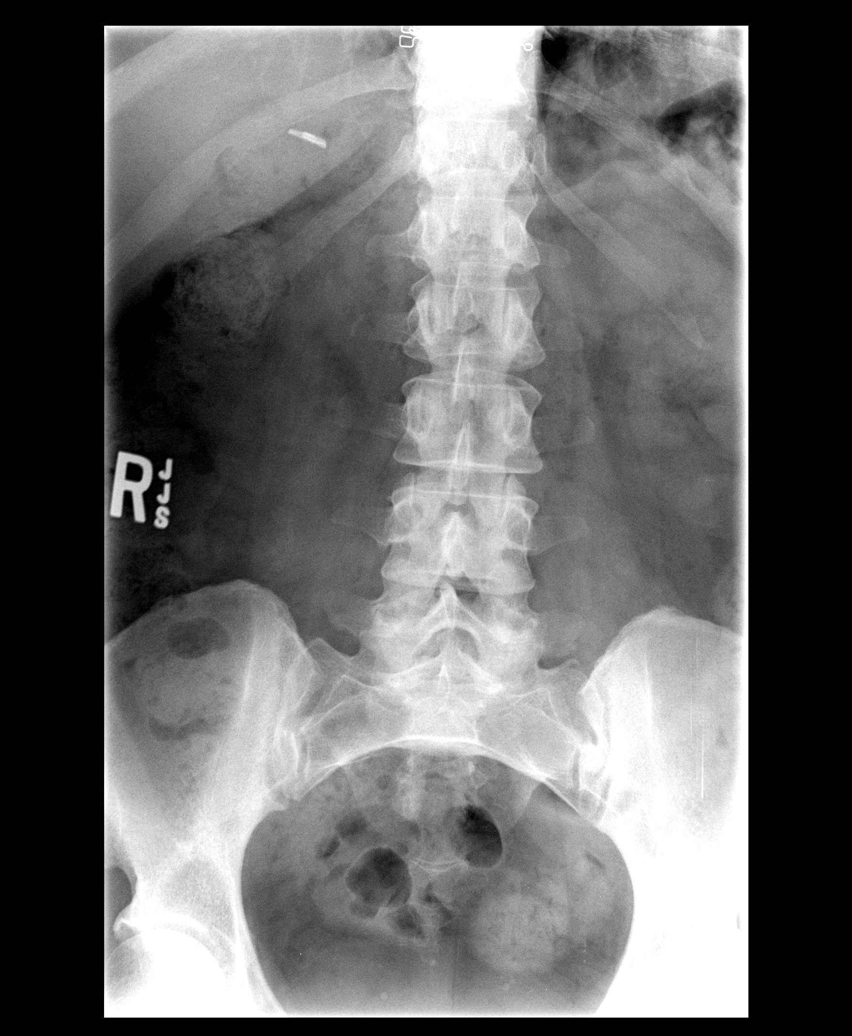

[view not recorded (2 of 5)]
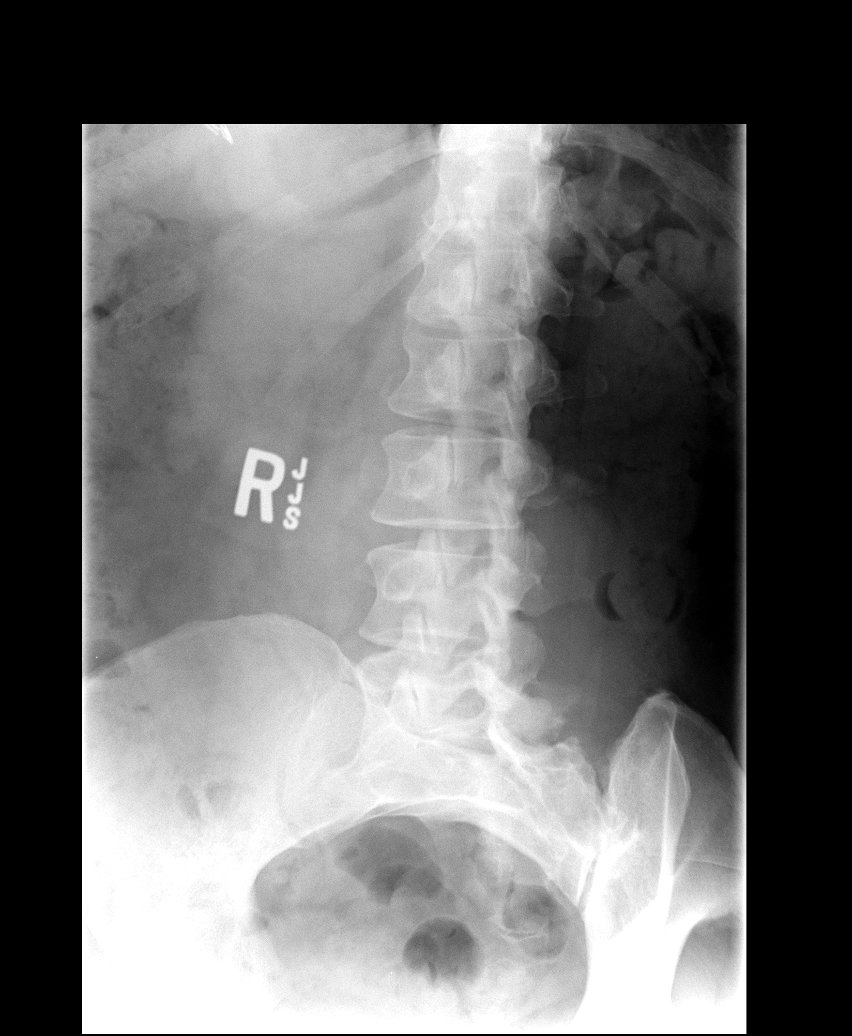

[view not recorded (3 of 5)]
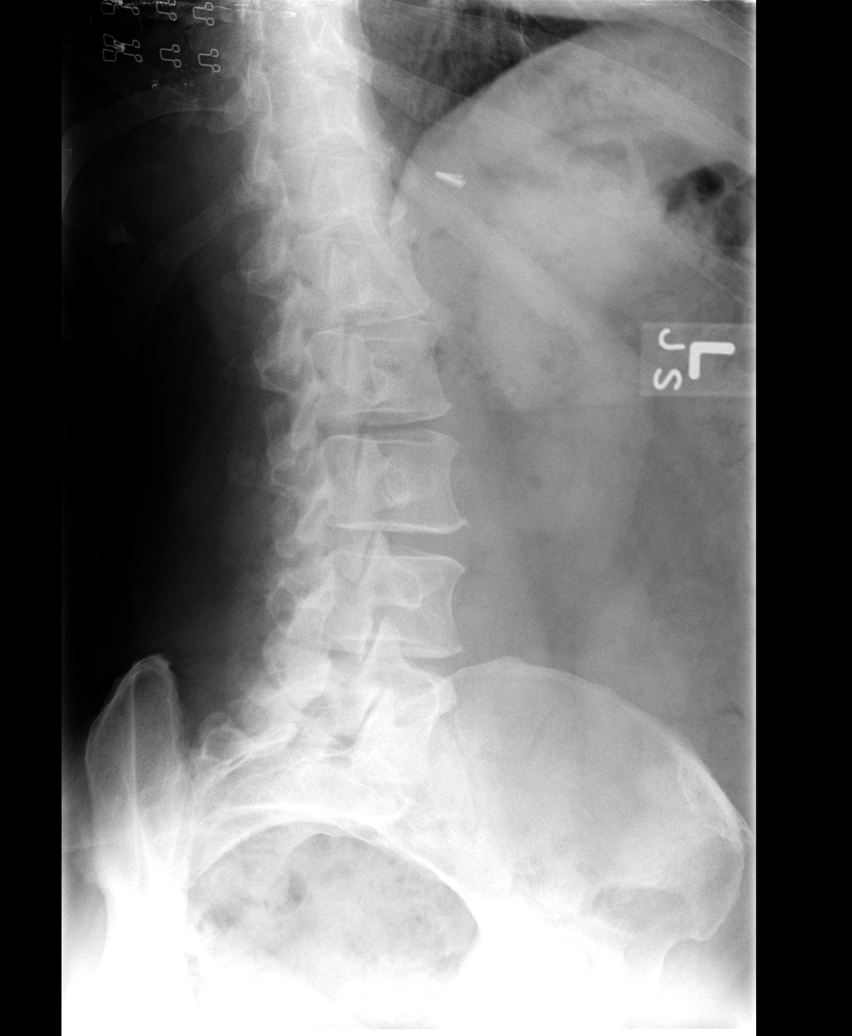

[view not recorded (4 of 5)]
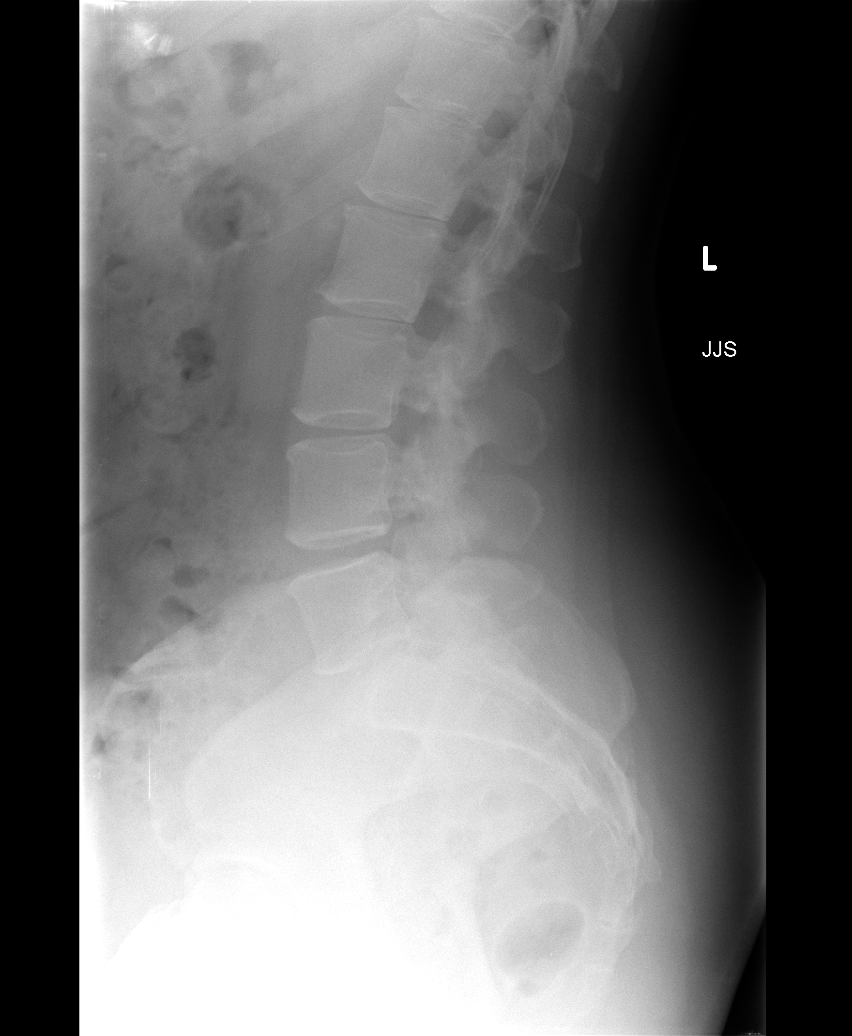

[view not recorded (5 of 5)]
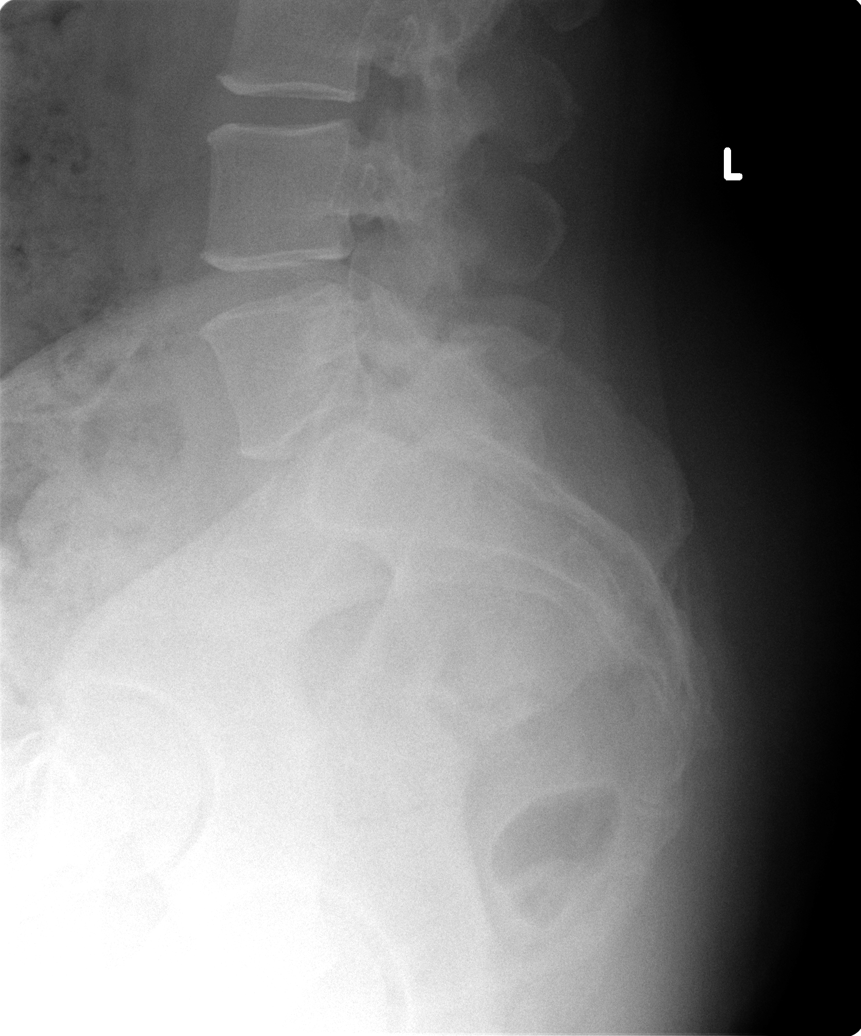

[5 of 5 positions shown; findings below may reference images not displayed]

FINDINGS: Alignment is anatomic.  Vertebral body height is
maintained.  Very minimal degenerative endplate changes at T12-L1
and L1-2. Facet hypertrophy in the lower lumbar spine.  No pars
defects.
IMPRESSION: Minimal scattered degenerative changes.  No acute findings.

## 2013-11-27 ENCOUNTER — Ambulatory Visit: Payer: Medicare Other | Admitting: *Deleted

## 2013-11-28 ENCOUNTER — Telehealth: Payer: Self-pay | Admitting: Family

## 2013-11-28 MED ORDER — FREESTYLE LANCETS MISC: Status: DC

## 2013-11-28 MED ORDER — GLUCOSE BLOOD VI STRP: ORAL_STRIP | Status: DC

## 2013-11-28 NOTE — Telephone Encounter (Signed)
Pt asked if you could send the glucose blood (FREESTYLE LITE) test strip and lancets to  [facility] on [address]. Pt states she can get for FREE if the rx is sent there. Pt test 2-3 x /day

## 2013-11-28 NOTE — Telephone Encounter (Signed)
done

## 2013-12-02 ENCOUNTER — Other Ambulatory Visit: Payer: Self-pay | Admitting: Endocrinology

## 2013-12-03 ENCOUNTER — Ambulatory Visit: Payer: Medicare Other | Admitting: Family

## 2013-12-03 ENCOUNTER — Other Ambulatory Visit: Payer: Self-pay | Admitting: *Deleted

## 2013-12-03 ENCOUNTER — Other Ambulatory Visit: Payer: Self-pay

## 2013-12-23 ENCOUNTER — Encounter: Payer: Self-pay | Admitting: Family

## 2013-12-23 ENCOUNTER — Ambulatory Visit (INDEPENDENT_AMBULATORY_CARE_PROVIDER_SITE_OTHER): Payer: Medicare Other | Admitting: Family

## 2013-12-23 VITALS — BP 108/64 | HR 84 | Wt 234.0 lb

## 2013-12-23 DIAGNOSIS — E119 Type 2 diabetes mellitus without complications: Secondary | ICD-10-CM

## 2013-12-23 DIAGNOSIS — F411 Generalized anxiety disorder: Secondary | ICD-10-CM

## 2013-12-23 LAB — HEMOGLOBIN A1C: HEMOGLOBIN A1C: 6.8 % — AB (ref 4.6–6.5)

## 2013-12-23 NOTE — Progress Notes (Signed)
Subjective:    Patient ID: [name], female    DOB: 09/12/1970, 43 y.o.   MRN: [id number]  HPI 43 y.o. White female presents today for a diabetes follow up appointment. At her last appointment her A1c was 7.0 and she was informed to continue with metformin. She has been taking her medications daily, reports no side effects from medications. Pt is followed by psychiatry and has recently had her medications changed, Latuda was discontinued and pt reports better blood glucose levels since that time. Denies fever, fatigue, chills, SOB, and change in appetite.     Review of Systems  Constitutional: Negative.   HENT: Negative.   Eyes: Negative.   Respiratory: Negative.   Cardiovascular: Negative.   Gastrointestinal: Negative.   Endocrine: Negative.   Musculoskeletal: Negative.   Skin: Negative.   Allergic/Immunologic: Negative.   Neurological: Negative.   Hematological: Negative.   Psychiatric/Behavioral: The patient is nervous/anxious.    Past Medical History  Diagnosis Date  . Obesity   . Dyslipidemia   . Diabetes mellitus   . Hyperlipidemia   . Obesity   . Tobacco use disorder   . Anxiety     Dr. Donnie Ahoobin Bridges, [facility]  . Depression     Clinical psychologist, Erlinda Hongharles Reagan  . ADD (attention deficit disorder)   . Cystocele     sees gyn, Dr. Reinaldo MeekerHenle  . Rectocele   . PTSD (post-traumatic stress disorder)     History   Social History  . Marital Status: Married    Spouse Name: N/A    Number of Children: 2  . Years of Education: N/A   Occupational History  . Not on file.   Social History Main Topics  . Smoking status: Current Every Day Smoker -- 1.00 packs/day for 7 years    Last Attempt to Quit: 09/13/2011  . Smokeless tobacco: Former NeurosurgeonUser    Quit date: 08/11/2011  . Alcohol Use: No  . Drug Use: No  . Sexual Activity: Not on file     Comment: trying to get disability; lives with husband; has children, but mother has custody   Other Topics  Concern  . Not on file   Social History Narrative  . No narrative on file    Past Surgical History  Procedure Laterality Date  . Tubal ligation    . Cholecystectomy    . Diagnostic mammogram      never; sees Dr. Reinaldo MeekerHenle  . Abdominal hysterectomy    . Incontinence surgery  June 2012  . Bladder suspension    . Rectocele repair    . Cystocele repair      Family History  Problem Relation Age of Onset  . Depression Mother   . Hyperlipidemia Mother   . Hypertension Mother   . Diabetes Mother   . Hyperlipidemia Father   . Depression Father   . Nephrolithiasis Brother     Allergies  Allergen Reactions  . Codeine Nausea And Vomiting    vomiting  . Nitrofurantoin Other (See Comments)    "extreme dehydration" requiring hospitalization  . Sulfa Antibiotics Other (See Comments)    Pt does not remember reaction    Current Outpatient Prescriptions on File Prior to Visit  Medication Sig Dispense Refill  . albuterol (PROVENTIL HFA;VENTOLIN HFA) 108 (90 BASE) MCG/ACT inhaler Inhale 2 puffs into the lungs every 4 (four) hours as needed for wheezing or shortness of breath.       . cholecalciferol (VITAMIN D) 1000 UNITS tablet Take  1,000 Units by mouth every morning.      . citalopram (CELEXA) 20 MG tablet Take 20 mg by mouth 2 (two) times daily.      . clonazePAM (KLONOPIN) 2 MG tablet Take 1.5 mg by mouth 3 (three) times daily as needed for anxiety.       . cyclobenzaprine (FLEXERIL) 10 MG tablet Take 1 tablet (10 mg total) by mouth 2 (two) times daily as needed for muscle spasms.  20 tablet  0  . glucose blood (FREESTYLE LITE) test strip USE TO TEST BLOOD SUGAR TWICE DAILY  100 each  3  . KOMBIGLYZE XR 5-500 MG TB24 TAKE 1 TABLET DAILY  30 tablet  3  . Lancets (FREESTYLE) lancets Use to check blood sugar twice daily  100 each  12  . meloxicam (MOBIC) 15 MG tablet Take 1 tablet (15 mg total) by mouth daily.  30 tablet  0  . metFORMIN (GLUCOPHAGE) 500 MG tablet Take 500 mg by mouth  daily with breakfast.      . pravastatin (PRAVACHOL) 40 MG tablet TAKE 1 TABLET EVERY DAY  30 tablet  1  . traMADol (ULTRAM) 50 MG tablet Take 1 tablet (50 mg total) by mouth every 8 (eight) hours as needed.  30 tablet  1  . vitamin C (ASCORBIC ACID) 500 MG tablet Take 500 mg by mouth every morning.       No current facility-administered medications on file prior to visit.    BP 108/64  Pulse 84  Wt 234 lb (106.142 kg)  LMP 05/19/2012chart    Objective:   Physical Exam  Constitutional: She is oriented to person, place, and time. She appears well-developed and well-nourished. She is active.  Cardiovascular: Normal rate, regular rhythm, normal heart sounds and normal pulses.   Pulmonary/Chest: Effort normal and breath sounds normal.  Abdominal: Soft. Normal appearance and bowel sounds are normal.  Neurological: She is alert and oriented to person, place, and time.  Skin: Skin is warm, dry and intact.  Psychiatric: Her speech is normal and behavior is normal. Thought content normal. Her mood appears anxious.          Assessment & Plan:  [name] was seen today for follow-up.  Diagnoses and associated orders for this visit:  Diabetes type 2, controlled - Hemoglobin A1c  Generalized anxiety disorder   Education: continue taking medications as prescribed. Exercise at least 30 minutes per day, 5 times per week. Follow up with psychiatry as scheduled for anxiety.   Follow up: 4 months for recheck.

## 2013-12-23 NOTE — Progress Notes (Signed)
Pre visit review using our clinic review tool, if applicable. No additional management support is needed unless otherwise documented below in the visit note. 

## 2013-12-23 NOTE — Patient Instructions (Signed)
Follow up with psych for anxiety. Appointment on July 3rd.   Exercise to Lose Weight Exercise and a healthy diet may help you lose weight. Your doctor may suggest specific exercises. EXERCISE IDEAS AND TIPS  Choose low-cost things you enjoy doing, such as walking, bicycling, or exercising to workout videos.  Take stairs instead of the elevator.  Walk during your lunch break.  Park your car further away from work or school.  Go to a gym or an exercise class.  Start with 5 to 10 minutes of exercise each day. Build up to 30 minutes of exercise 4 to 6 days a week.  Wear shoes with good support and comfortable clothes.  Stretch before and after working out.  Work out until you breathe harder and your heart beats faster.  Drink extra water when you exercise.  Do not do so much that you hurt yourself, feel dizzy, or get very short of breath. Exercises that burn about 150 calories:  Running 1  miles in 15 minutes.  Playing volleyball for 45 to 60 minutes.  Washing and waxing a car for 45 to 60 minutes.  Playing touch football for 45 minutes.  Walking 1  miles in 35 minutes.  Pushing a stroller 1  miles in 30 minutes.  Playing basketball for 30 minutes.  Raking leaves for 30 minutes.  Bicycling 5 miles in 30 minutes.  Walking 2 miles in 30 minutes.  Dancing for 30 minutes.  Shoveling snow for 15 minutes.  Swimming laps for 20 minutes.  Walking up stairs for 15 minutes.  Bicycling 4 miles in 15 minutes.  Gardening for 30 to 45 minutes.  Jumping rope for 15 minutes.  Washing windows or floors for 45 to 60 minutes. Document Released: 07/15/2010 Document Revised: 09/04/2011 Document Reviewed: 07/15/2010 [facility] Patient Information 2015 [place], [place]. This information is not intended to replace advice given to you by your health care provider. Make sure you discuss any questions you have with your health care provider.

## 2013-12-24 ENCOUNTER — Other Ambulatory Visit: Payer: Self-pay | Admitting: Family

## 2014-02-09 ENCOUNTER — Encounter: Payer: Self-pay | Admitting: Family

## 2014-02-09 ENCOUNTER — Ambulatory Visit (INDEPENDENT_AMBULATORY_CARE_PROVIDER_SITE_OTHER): Payer: Medicare Other | Admitting: Family

## 2014-02-09 VITALS — BP 98/60 | HR 94 | Temp 98.9°F | Wt 232.0 lb

## 2014-02-09 DIAGNOSIS — R300 Dysuria: Secondary | ICD-10-CM

## 2014-02-09 DIAGNOSIS — E871 Hypo-osmolality and hyponatremia: Secondary | ICD-10-CM

## 2014-02-09 DIAGNOSIS — F319 Bipolar disorder, unspecified: Secondary | ICD-10-CM

## 2014-02-09 LAB — COMPREHENSIVE METABOLIC PANEL
ALK PHOS: 67 U/L (ref 39–117)
ALT: 28 U/L (ref 0–35)
AST: 27 U/L (ref 0–37)
Albumin: 4 g/dL (ref 3.5–5.2)
BUN: 12 mg/dL (ref 6–23)
CO2: 27 mEq/L (ref 19–32)
CREATININE: 0.7 mg/dL (ref 0.4–1.2)
Calcium: 9.2 mg/dL (ref 8.4–10.5)
Chloride: 101 mEq/L (ref 96–112)
GFR: 92.03 mL/min (ref >60.00)
Glucose, Bld: 107 mg/dL — ABNORMAL HIGH (ref 70–99)
Potassium: 4.1 mEq/L (ref 3.5–5.1)
Sodium: 138 mEq/L (ref 135–145)
Total Bilirubin: 0.3 mg/dL (ref 0.2–1.2)
Total Protein: 7.3 g/dL (ref 6.0–8.3)

## 2014-02-09 LAB — POCT URINALYSIS DIPSTICK
Bilirubin, UA: NEGATIVE
Blood, UA: NEGATIVE
GLUCOSE UA: NEGATIVE
Ketones, UA: NEGATIVE
LEUKOCYTES UA: NEGATIVE
NITRITE UA: NEGATIVE
Protein, UA: NEGATIVE
Spec Grav, UA: 1.01
UROBILINOGEN UA: 0.2
pH, UA: 6

## 2014-02-09 MED ORDER — FLUCONAZOLE 150 MG PO TABS: 150.0000 mg | ORAL_TABLET | Freq: Once | ORAL | Status: DC

## 2014-02-09 MED ORDER — CIPROFLOXACIN HCL 250 MG PO TABS: 250.0000 mg | ORAL_TABLET | Freq: Two times a day (BID) | ORAL | Status: DC

## 2014-02-09 NOTE — Progress Notes (Signed)
Subjective:    Patient ID: [name], female    DOB: July 06, 1970, 43 y.o.   MRN: [id number]  HPI 43 year old white female, nonsmoker with a history of bipolar disorder is in today with complaints of urinary frequency and urgency x 2 days. Denies any vaginal discharge or odor. Reports tenderness over her bladder.  The patient is requesting to have her sodium checked today due to the history of hyponatremia related to taking Latuda.    Review of Systems  Constitutional: Negative.   Respiratory: Negative.   Cardiovascular: Negative.   Gastrointestinal: Negative.   Genitourinary: Positive for dysuria and frequency. Negative for hematuria, vaginal discharge and vaginal pain.  Musculoskeletal: Negative for back pain.  Skin: Negative.   Allergic/Immunologic: Negative.   Neurological: Negative.   Psychiatric/Behavioral: Negative.    Past Medical History  Diagnosis Date  . Obesity   . Dyslipidemia   . Diabetes mellitus   . Hyperlipidemia   . Obesity   . Tobacco use disorder   . Anxiety     Dr. Donnie Aho, [facility]  . Depression     Clinical psychologist, Erlinda Hong  . ADD (attention deficit disorder)   . Cystocele     sees gyn, Dr. Reinaldo Meeker  . Rectocele   . PTSD (post-traumatic stress disorder)     History   Social History  . Marital Status: Married    Spouse Name: N/A    Number of Children: 2  . Years of Education: N/A   Occupational History  . Not on file.   Social History Main Topics  . Smoking status: Current Every Day Smoker -- 1.00 packs/day for 7 years    Last Attempt to Quit: 09/13/2011  . Smokeless tobacco: Former Neurosurgeon    Quit date: 08/11/2011  . Alcohol Use: No  . Drug Use: No  . Sexual Activity: Not on file     Comment: trying to get disability; lives with husband; has children, but mother has custody   Other Topics Concern  . Not on file   Social History Narrative  . No narrative on file    Past Surgical History  Procedure  Laterality Date  . Tubal ligation    . Cholecystectomy    . Diagnostic mammogram      never; sees Dr. Reinaldo Meeker  . Abdominal hysterectomy    . Incontinence surgery  June 2012  . Bladder suspension    . Rectocele repair    . Cystocele repair      Family History  Problem Relation Age of Onset  . Depression Mother   . Hyperlipidemia Mother   . Hypertension Mother   . Diabetes Mother   . Hyperlipidemia Father   . Depression Father   . Nephrolithiasis Brother     Allergies  Allergen Reactions  . Codeine Nausea And Vomiting    vomiting  . Nitrofurantoin Other (See Comments)    "extreme dehydration" requiring hospitalization  . Sulfa Antibiotics Other (See Comments)    Pt does not remember reaction    Current Outpatient Prescriptions on File Prior to Visit  Medication Sig Dispense Refill  . albuterol (PROVENTIL HFA;VENTOLIN HFA) 108 (90 BASE) MCG/ACT inhaler Inhale 2 puffs into the lungs every 4 (four) hours as needed for wheezing or shortness of breath.       . cholecalciferol (VITAMIN D) 1000 UNITS tablet Take 1,000 Units by mouth every morning.      . citalopram (CELEXA) 20 MG tablet Take 20 mg by mouth 2 (  two) times daily.      . clonazePAM (KLONOPIN) 2 MG tablet Take 1.5 mg by mouth 3 (three) times daily as needed for anxiety.       . cyclobenzaprine (FLEXERIL) 10 MG tablet Take 1 tablet (10 mg total) by mouth 2 (two) times daily as needed for muscle spasms.  20 tablet  0  . glucose blood (FREESTYLE LITE) test strip USE TO TEST BLOOD SUGAR TWICE DAILY  100 each  3  . hydrOXYzine (ATARAX/VISTARIL) 50 MG tablet Take 50 mg by mouth at bedtime.       Marland Kitchen. KOMBIGLYZE XR 5-500 MG TB24 TAKE 1 TABLET DAILY  30 tablet  3  . Lancets (FREESTYLE) lancets Use to check blood sugar twice daily  100 each  12  . meloxicam (MOBIC) 15 MG tablet Take 1 tablet (15 mg total) by mouth daily.  30 tablet  0  . pravastatin (PRAVACHOL) 40 MG tablet TAKE 1 TABLET EVERY DAY  90 tablet  0  . traMADol (ULTRAM)  50 MG tablet Take 1 tablet (50 mg total) by mouth every 8 (eight) hours as needed.  30 tablet  1  . vitamin C (ASCORBIC ACID) 500 MG tablet Take 500 mg by mouth every morning.       No current facility-administered medications on file prior to visit.    BP 98/60  Pulse 94  Temp(Src) 98.9 F (37.2 C) (Oral)  Wt 232 lb (105.235 kg)  SpO2 94%  LMP 05/19/2012chart    Objective:   Physical Exam  Constitutional: She is oriented to person, place, and time. She appears well-developed and well-nourished.  Neck: Normal range of motion. Neck supple.  Cardiovascular: Normal rate, regular rhythm and normal heart sounds.   Pulmonary/Chest: Effort normal and breath sounds normal.  Abdominal: There is tenderness. There is no rebound and no guarding.  Suprapubic tenderness.   Musculoskeletal: Normal range of motion.  Neurological: She is alert and oriented to person, place, and time.  Skin: Skin is warm and dry.  Psychiatric: She has a normal mood and affect.          Assessment & Plan:  [name] was seen today for no specified reason.  Diagnoses and associated orders for this visit:  Dysuria - POCT Urine Dip - Cancel: Culture, Urine - Culture, Urine  Hyponatremia - CMP  Bipolar disorder, unspecified  Other Orders - ciprofloxacin (CIPRO) 250 MG tablet; Take 1 tablet (250 mg total) by mouth 2 (two) times daily. - fluconazole (DIFLUCAN) 150 MG tablet; Take 1 tablet (150 mg total) by mouth once.   Call the office with any questions or concerns. Recheck as scheduled and as needed.

## 2014-02-09 NOTE — Progress Notes (Signed)
Pre visit review using our clinic review tool, if applicable. No additional management support is needed unless otherwise documented below in the visit note. 

## 2014-02-09 NOTE — Patient Instructions (Signed)

## 2014-02-12 LAB — URINE CULTURE: Colony Count: >=100000

## 2014-03-12 ENCOUNTER — Telehealth: Payer: Self-pay | Admitting: Family

## 2014-03-12 MED ORDER — SAXAGLIPTIN-METFORMIN ER 5-500 MG PO TB24: ORAL_TABLET | ORAL | Status: DC

## 2014-03-12 NOTE — Telephone Encounter (Signed)
Called and spoke with  pt and pt is aware. Pt verbalized understanding.  Advised pt that rx was sent to pharmacy.  Advised pt to take both tablets during the day at the same time.

## 2014-03-12 NOTE — Telephone Encounter (Signed)
Patient Information:  Caller Name: Jung  Phone: [phone]  Patient: [name], [name]  Gender: Female  DOB: 11/28/1970  Age: 43 Years  PCP: Adline Mango [facility])  Pregnant: No  Office Follow Up:  Does the office need to follow up with this patient?: Yes  Instructions For The Office: Please respond to patient question regarding her Kombiglyze XR - should dose be increased or not?  Thank you   Symptoms  Reason For Call & Symptoms: Patient reports she goes to Psychiatrist and is on Luvox ER 150 mg.  She also has Diabetes; FSBG 183 this am; usually runs in 140's.  She is concerned that the increased dose of Luvox ER is the problem. Additionally she is under stress due to anticipated death of grandmother soon.  She relates also that the Luvox is the only thing that has helped with her anxiety and she would like to continue that medication if  possible.  Her question is whether or not the dose for her Kombiglyze ER should be increased.  Discuss with PCP and Callback by Nurse Today per Diabetes -High Blood Sugar guideline due to Caller has non-urgent medication question about med that PCP prescribed and triager unable to answer question.  Reviewed Health History In EMR: Yes  Reviewed Medications In EMR: Yes  Reviewed Allergies In EMR: Yes  Reviewed Surgeries / Procedures: Yes  Date of Onset of Symptoms: 03/12/2014  Treatments Tried: Coffee with cinnamon  Treatments Tried Worked: No OB / GYN:  LMP: Unknown  Guideline(s) Used:  Diabetes - High Blood Sugar  Disposition Per Guideline:   Discuss with PCP and Callback by Nurse Today  Reason For Disposition Reached:   Caller has NON-URGENT medication question about med that PCP prescribed and triager unable to answer question  Advice Given:  General  Definition of hyperglycemia: - Fasting blood glucose more than 140 mg/dL (7.5 mmol/l) or random blood glucose more than 200 mg/dL (11 mmol/l).  Treatment - Liquids  Drink at  least one glass (8 oz or 240 ml) of water per hour for the next 4 hours. (Reason: adequate hydration will reduce hyperglycemia).  Generally, you should try to drink 6-8 glasses of water each day.  Treatment - Diabetes Medications  : Continue taking your diabetes pills.  Expected Course  Your blood sugar continues to get above 240 mg/dl (13 mmol/l).  Call Back If:  You become worse.  Patient Will Follow Care Advice:  YES

## 2014-03-12 NOTE — Telephone Encounter (Signed)
May increase to 2 tabs a day. Recheck in 2 weeks.

## 2014-03-19 ENCOUNTER — Telehealth: Payer: Self-pay | Admitting: Family

## 2014-03-19 NOTE — Telephone Encounter (Signed)
Please advise 

## 2014-03-19 NOTE — Telephone Encounter (Signed)
PA for quantity 60 per 30 for Kombiglyze .  Plan only allows 30 per 30 days.

## 2014-03-23 MED ORDER — SAXAGLIPTIN-METFORMIN ER 5-1000 MG PO TB24: 1.0000 | ORAL_TABLET | Freq: Every day | ORAL | Status: DC

## 2014-03-23 NOTE — Telephone Encounter (Signed)
Continue once daily. I have increased the dosage

## 2014-03-24 ENCOUNTER — Encounter: Payer: Self-pay | Admitting: Family

## 2014-03-24 ENCOUNTER — Ambulatory Visit (INDEPENDENT_AMBULATORY_CARE_PROVIDER_SITE_OTHER): Payer: Medicare Other | Admitting: Family

## 2014-03-24 ENCOUNTER — Telehealth: Payer: Self-pay | Admitting: Family

## 2014-03-24 VITALS — BP 98/62 | HR 86 | Temp 98.2°F | Wt 231.0 lb

## 2014-03-24 DIAGNOSIS — E7800 Pure hypercholesterolemia, unspecified: Secondary | ICD-10-CM

## 2014-03-24 DIAGNOSIS — IMO0001 Reserved for inherently not codable concepts without codable children: Secondary | ICD-10-CM

## 2014-03-24 DIAGNOSIS — F424 Excoriation (skin-picking) disorder: Secondary | ICD-10-CM

## 2014-03-24 DIAGNOSIS — F411 Generalized anxiety disorder: Secondary | ICD-10-CM

## 2014-03-24 DIAGNOSIS — L981 Factitial dermatitis: Secondary | ICD-10-CM

## 2014-03-24 DIAGNOSIS — Z23 Encounter for immunization: Secondary | ICD-10-CM

## 2014-03-24 DIAGNOSIS — L299 Pruritus, unspecified: Secondary | ICD-10-CM

## 2014-03-24 DIAGNOSIS — E1165 Type 2 diabetes mellitus with hyperglycemia: Secondary | ICD-10-CM

## 2014-03-24 LAB — BASIC METABOLIC PANEL
BUN: 14 mg/dL (ref 6–23)
CHLORIDE: 99 meq/L (ref 96–112)
CO2: 30 mEq/L (ref 19–32)
Calcium: 9.6 mg/dL (ref 8.4–10.5)
Creatinine, Ser: 0.8 mg/dL (ref 0.4–1.2)
GFR: 81.58 mL/min (ref >60.00)
Glucose, Bld: 175 mg/dL — ABNORMAL HIGH (ref 70–99)
POTASSIUM: 4.8 meq/L (ref 3.5–5.1)
Sodium: 135 mEq/L (ref 135–145)

## 2014-03-24 LAB — HEPATIC FUNCTION PANEL
ALT: 31 U/L (ref 0–35)
AST: 32 U/L (ref 0–37)
Albumin: 4.3 g/dL (ref 3.5–5.2)
Alkaline Phosphatase: 70 U/L (ref 39–117)
BILIRUBIN DIRECT: 0.1 mg/dL (ref 0.0–0.3)
BILIRUBIN TOTAL: 0.6 mg/dL (ref 0.2–1.2)
Total Protein: 7.7 g/dL (ref 6.0–8.3)

## 2014-03-24 LAB — LIPID PANEL
CHOL/HDL RATIO: 4
Cholesterol: 166 mg/dL (ref 0–200)
HDL: 47.3 mg/dL (ref >39.00)
LDL Cholesterol: 98 mg/dL (ref 0–99)
NONHDL: 118.7
Triglycerides: 105 mg/dL (ref 0.0–149.0)
VLDL: 21 mg/dL (ref 0.0–40.0)

## 2014-03-24 LAB — HEMOGLOBIN A1C: Hgb A1c MFr Bld: 7.1 % — ABNORMAL HIGH (ref 4.6–6.5)

## 2014-03-24 MED ORDER — HYDROXYZINE HCL 50 MG PO TABS: 50.0000 mg | ORAL_TABLET | Freq: Two times a day (BID) | ORAL | Status: DC | PRN

## 2014-03-24 NOTE — Telephone Encounter (Signed)
Caller: [name]/Patient; Phone: [phone]; Reason for Call: Calling regarding Hgb A1C.  Was seen at the office today, labwork was done and Hgb A1C went up from 6.8 to 7.1.  Pt has been prescribed Levox(?) by her psychiatrist and thinks that's what is causing her A1C to go up.  She wants to know if she should stop taking med.  PLEASE ADVISE.

## 2014-03-24 NOTE — Patient Instructions (Signed)
Pruritus  °Pruritus is an itch. There are many different problems that can cause an itch. Dry skin is one of the most common causes of itching. Most cases of itching do not require medical attention.  °HOME CARE INSTRUCTIONS  °Make sure your skin is moistened on a regular basis. A moisturizer that contains petroleum jelly is best for keeping moisture in your skin. If you develop a rash, you may try the following for relief:  °· Use corticosteroid cream. °· Apply cool compresses to the affected areas. °· Bathe with Epsom salts or baking soda in the bathwater. °· Soak in colloidal oatmeal baths. These are available at your pharmacy. °· Apply baking soda paste to the rash. Stir water into baking soda until it reaches a paste-like consistency. °· Use an anti-itch lotion. °· Take over-the-counter diphenhydramine medicine by mouth as the instructions direct. °· Avoid scratching. Scratching may cause the rash to become infected. If itching is very bad, your caregiver may suggest prescription lotions or creams to lessen your symptoms. °· Avoid hot showers, which can make itching worse. A cold shower may help with itching as long as you use a moisturizer after the shower. °SEEK MEDICAL CARE IF: °The itching does not go away after several days. °Document Released: 02/22/2011 Document Revised: 10/27/2013 Document Reviewed: 02/22/2011 °ExitCare® Patient Information ©2015 ExitCare, LLC. This information is not intended to replace advice given to you by your health care provider. Make sure you discuss any questions you have with your health care provider. ° °

## 2014-03-24 NOTE — Progress Notes (Signed)
Subjective:    Patient ID: [name], female    DOB: 04/27/1971, 43 y.o.   MRN: [id number]   HPI 43 year old white female, nonsmoker with a history of generalized anxiety disorder, type 2 diabetes, hypertension, hyperlipidemia is in for recheck. Also has complaints of itchy skin and she's been taking for about 1 week. Denies any changes in detergents prior to the rash. Has since changed her soap to Aveeno attempting to control pruritus. Reports having flares of this dermatitis in the past and it had to see dermatology. Requesting a referral. Has been applying cortisone 10 without much relief.   Review of Systems  Constitutional: Negative.   HENT: Negative.   Respiratory: Negative.   Cardiovascular: Negative.   Gastrointestinal: Negative.   Endocrine: Negative.  Negative for polydipsia, polyphagia and polyuria.  Genitourinary: Negative.   Musculoskeletal: Negative.   Skin: Positive for rash and wound.       Itchy rash that she has been picking   Allergic/Immunologic: Negative.   Neurological: Negative.   Psychiatric/Behavioral: Positive for sleep disturbance and agitation. The patient is nervous/anxious.    Past Medical History  Diagnosis Date  . Obesity   . Dyslipidemia   . Diabetes mellitus   . Hyperlipidemia   . Obesity   . Tobacco use disorder   . Anxiety     Dr. Donnie Aho, [facility]  . Depression     Clinical psychologist, Erlinda Hong  . ADD (attention deficit disorder)   . Cystocele     sees gyn, Dr. Reinaldo Meeker  . Rectocele   . PTSD (post-traumatic stress disorder)     History   Social History  . Marital Status: Married    Spouse Name: N/A    Number of Children: 2  . Years of Education: N/A   Occupational History  . Not on file.   Social History Main Topics  . Smoking status: Current Every Day Smoker -- 1.00 packs/day for 7 years    Last Attempt to Quit: 09/13/2011  . Smokeless tobacco: Former Neurosurgeon    Quit date: 08/11/2011  . Alcohol  Use: No  . Drug Use: No  . Sexual Activity: Not on file     Comment: trying to get disability; lives with husband; has children, but mother has custody   Other Topics Concern  . Not on file   Social History Narrative  . No narrative on file    Past Surgical History  Procedure Laterality Date  . Tubal ligation    . Cholecystectomy    . Diagnostic mammogram      never; sees Dr. Reinaldo Meeker  . Abdominal hysterectomy    . Incontinence surgery  June 2012  . Bladder suspension    . Rectocele repair    . Cystocele repair      Family History  Problem Relation Age of Onset  . Depression Mother   . Hyperlipidemia Mother   . Hypertension Mother   . Diabetes Mother   . Hyperlipidemia Father   . Depression Father   . Nephrolithiasis Brother     Allergies  Allergen Reactions  . Codeine Nausea And Vomiting    vomiting  . Nitrofurantoin Other (See Comments)    "extreme dehydration" requiring hospitalization  . Sulfa Antibiotics Other (See Comments)    Pt does not remember reaction    Current Outpatient Prescriptions on File Prior to Visit  Medication Sig Dispense Refill  . cholecalciferol (VITAMIN D) 1000 UNITS tablet Take 1,000 Units by  mouth every morning.      . clonazePAM (KLONOPIN) 2 MG tablet Take 3 mg by mouth daily.       . Fluvoxamine Maleate 150 MG CP24 Take 150 mg by mouth at bedtime.      Marland Kitchen. glucose blood (FREESTYLE LITE) test strip USE TO TEST BLOOD SUGAR TWICE DAILY  100 each  3  . Lancets (FREESTYLE) lancets Use to check blood sugar twice daily  100 each  12  . pravastatin (PRAVACHOL) 40 MG tablet TAKE 1 TABLET EVERY DAY  90 tablet  0  . Saxagliptin-Metformin (KOMBIGLYZE XR) 10-998 MG TB24 Take 1 tablet by mouth daily.  30 tablet  3  . traMADol (ULTRAM) 50 MG tablet Take 1 tablet (50 mg total) by mouth every 8 (eight) hours as needed.  30 tablet  1   No current facility-administered medications on file prior to visit.    BP 98/62  Pulse 86  Temp(Src) 98.2 F  (36.8 C) (Oral)  Wt 231 lb (104.781 kg)  LMP 05/19/2012chart     Objective:   Physical Exam  Constitutional: She is oriented to person, place, and time. She appears well-developed and well-nourished.  HENT:  Right Ear: External ear normal.  Left Ear: External ear normal.  Nose: Nose normal.  Mouth/Throat: Oropharynx is clear and moist.  Neck: Normal range of motion. Neck supple.  Cardiovascular: Normal rate, regular rhythm and normal heart sounds.   Pulmonary/Chest: Effort normal and breath sounds normal.  Abdominal: Soft. Bowel sounds are normal.  Musculoskeletal: Normal range of motion.  Neurological: She is alert and oriented to person, place, and time.  Skin: Skin is warm and dry.  No visible rash but obvious picking of the skin to the arms, upper chest, and upper back   Psychiatric: She has a normal mood and affect.          Assessment & Plan:  [name] was seen today for no specified reason.  Diagnoses and associated orders for this visit:  Pruritus - Ambulatory referral to Dermatology  Skin-picking disorder - Ambulatory referral to Dermatology  Type II or unspecified type diabetes mellitus without mention of complication, uncontrolled - Hemoglobin A1c - Basic Metabolic Panel - Hepatic Function Panel  Pure hypercholesterolemia - Lipid Panel - Basic Metabolic Panel - Hepatic Function Panel  Generalized anxiety disorder  Need for prophylactic vaccination and inoculation against influenza - Flu Vaccine QUAD 36+ mos PF IM (Fluarix Quad PF)  Need for prophylactic vaccination against Streptococcus pneumoniae (pneumococcus) - Pneumococcal conjugate vaccine 13-valent  Other Orders - hydrOXYzine (ATARAX/VISTARIL) 50 MG tablet; Take 1 tablet (50 mg total) by mouth 2 (two) times daily as needed.   Call the office with any questions or concerns. Recheck as scheduled and as needed.

## 2014-03-24 NOTE — Progress Notes (Signed)
Pre visit review using our clinic review tool, if applicable. No additional management support is needed unless otherwise documented below in the visit note. 

## 2014-03-25 ENCOUNTER — Telehealth: Payer: Self-pay | Admitting: Family

## 2014-03-25 ENCOUNTER — Ambulatory Visit: Payer: Medicare Other | Admitting: Family

## 2014-03-25 MED ORDER — TRAMADOL HCL 50 MG PO TABS: 50.0000 mg | ORAL_TABLET | Freq: Three times a day (TID) | ORAL | Status: DC | PRN

## 2014-03-25 NOTE — Telephone Encounter (Signed)
emmi emailed °

## 2014-03-25 NOTE — Telephone Encounter (Signed)
Per Oran ReinPadonda, medication Rxd by psychiatry is not the cause of her a1c rising. Pt aware

## 2014-03-25 NOTE — Telephone Encounter (Signed)
Tramadol and Atarax phoned into pharmacy and pt is aware.

## 2014-03-25 NOTE — Telephone Encounter (Signed)
Pt called to say that she was here on 03/24/14 and was told her rx traMADol (ULTRAM) 50 MG tablet and some type of cream for her arm rash. She contacted the pharmacy and there is no rx there for her. I did advise her that we had computer issues on yesterday.     Pharmacy [facility]

## 2014-04-09 ENCOUNTER — Telehealth: Payer: Self-pay | Admitting: Family

## 2014-04-09 NOTE — Telephone Encounter (Signed)
We do not have labs from dermatology yet, however I will call pt once they are faxed to us and are [facility]

## 2014-04-09 NOTE — Telephone Encounter (Signed)
Pt went to see a dermatologist and they did labs on here. Office called pt and told her some of here labs were abnormal. She would like a call back once Padonda receives her results.

## 2014-04-13 ENCOUNTER — Telehealth: Payer: Self-pay | Admitting: Family

## 2014-04-13 NOTE — Telephone Encounter (Signed)
Pt stated her grandmother is dying and will callback to sch appt

## 2014-04-13 NOTE — Telephone Encounter (Signed)
Schedule appt to have her seen this week

## 2014-04-13 NOTE — Telephone Encounter (Signed)
Patient Information:  Caller Name: [name]  Phone: [phone]  Patient: [name], [name]  Gender: Female  DOB: 02/08/1971  Age: 43 Years  PCP: Adline Mangoampbell, Padonda [facility](Family Practice)  Pregnant: No  Office Follow Up:  Does the office need to follow up with this patient?: Yes  Instructions For The Office: Requesting medication  RN Note:  Patient is calling regarding stress and elevated blood pressure.  Her grandmother is currently in a nursing home and has been unresponsive for >48 hours.  Hospice has been called in and patient admits she herself has not slept in a week.  Her blood pressure was checked by nurse today and was 132/96.  She admits she has not been checking her blood sugar since her grandmother got ill.  Patient is requesting "something for her nerves"  She has not contacted her Physiatrist stating "He is difficult to reach."  Requesting consideration of something to be [phone].  Advised to monitor blood sugar and need to follow up with office within 2 weeks regarding blood pressure.  Symptoms  Reason For Call & Symptoms: grandmother is dying, under to stress and not sleeping  Reviewed Health History In EMR: Yes  Reviewed Medications In EMR: Yes  Reviewed Allergies In EMR: Yes  Reviewed Surgeries / Procedures: Yes  Date of Onset of Symptoms: 04/06/2014 OB / GYN:  LMP: Unknown  Guideline(s) Used:  No Protocol Available - Information Only  Diabetes - High Blood Sugar  High Blood Pressure  Disposition Per Guideline:   See Within 2 Weeks in Office  Reason For Disposition Reached:   BP > 130/80 and history of heart problems, kidney disease, or diabetes  Advice Given:  Call Back If:  You become worse.  Patient Will Follow Care Advice:  YES

## 2014-04-13 NOTE — Telephone Encounter (Signed)
Please advise 

## 2014-04-16 ENCOUNTER — Ambulatory Visit (INDEPENDENT_AMBULATORY_CARE_PROVIDER_SITE_OTHER): Payer: Medicare Other | Admitting: Family Medicine

## 2014-04-16 ENCOUNTER — Encounter: Payer: Self-pay | Admitting: Family Medicine

## 2014-04-16 VITALS — BP 120/82 | HR 88 | Temp 97.9°F | Ht 65.0 in | Wt 233.3 lb

## 2014-04-16 DIAGNOSIS — F4321 Adjustment disorder with depressed mood: Secondary | ICD-10-CM

## 2014-04-16 DIAGNOSIS — O99814 Abnormal glucose complicating childbirth: Secondary | ICD-10-CM

## 2014-04-16 DIAGNOSIS — R030 Elevated blood-pressure reading, without diagnosis of hypertension: Secondary | ICD-10-CM

## 2014-04-16 DIAGNOSIS — IMO0001 Reserved for inherently not codable concepts without codable children: Secondary | ICD-10-CM

## 2014-04-16 DIAGNOSIS — F410 Panic disorder [episodic paroxysmal anxiety] without agoraphobia: Secondary | ICD-10-CM

## 2014-04-16 MED ORDER — GLUCOSE BLOOD VI STRP: ORAL_STRIP | Status: DC

## 2014-04-16 NOTE — Progress Notes (Signed)
Pre visit review using our clinic review tool, if applicable. No additional management support is needed unless otherwise documented below in the visit note. 

## 2014-04-16 NOTE — Patient Instructions (Signed)
-  I am so sorry about your loss  -bereavement counseling  -sees your psychiatrist about medications  -follow up with Padonda in 2 weeks

## 2014-04-16 NOTE — Progress Notes (Signed)
No chief complaint on file.   HPI:  Acute visit for:  1) Elevated BP/Anxiety: -hx of severe GAD, PTSD treated by her psychiatrist - has appointment to see psychitrist -Grandmother just had prolonged death with hospice and she watched her die -2 days ago she was alone with grandmother, was watching her die and had panic attack with room closing in, anxiety, tingling in hands, chest heaviness and nurse check her BP during the attack and it was 150s/90s and she told her to see PCP about this -she has had these symptoms with panic in the past -denies hx of needing medications for BP otherwise -other symptoms: grief, sadness, anxiety at times, worries about her mother coping, thinking about her own mortality -taking her clonazepa, fluvoxamine and atarax managed by psychiatrist -denies: SI, thoughts of self harm   ROS: See pertinent positives and negatives per HPI.  Past Medical History  Diagnosis Date  . Obesity   . Dyslipidemia   . Diabetes mellitus   . Hyperlipidemia   . Obesity   . Tobacco use disorder   . Anxiety     Dr. Donnie Ahoobin Bridges, [facility]  . Depression     Clinical psychologist, Erlinda Hongharles Reagan  . ADD (attention deficit disorder)   . Cystocele     sees gyn, Dr. Reinaldo MeekerHenle  . Rectocele   . PTSD (post-traumatic stress disorder)     Past Surgical History  Procedure Laterality Date  . Tubal ligation    . Cholecystectomy    . Diagnostic mammogram      never; sees Dr. Reinaldo MeekerHenle  . Abdominal hysterectomy    . Incontinence surgery  June 2012  . Bladder suspension    . Rectocele repair    . Cystocele repair      Family History  Problem Relation Age of Onset  . Depression Mother   . Hyperlipidemia Mother   . Hypertension Mother   . Diabetes Mother   . Hyperlipidemia Father   . Depression Father   . Nephrolithiasis Brother     History   Social History  . Marital Status: Married    Spouse Name: N/A    Number of Children: 2  . Years of Education: N/A    Social History Main Topics  . Smoking status: Current Every Day Smoker -- 1.00 packs/day for 7 years    Last Attempt to Quit: 09/13/2011  . Smokeless tobacco: Former NeurosurgeonUser    Quit date: 08/11/2011  . Alcohol Use: No  . Drug Use: No  . Sexual Activity: None     Comment: trying to get disability; lives with husband; has children, but mother has custody   Other Topics Concern  . None   Social History Narrative  . None    Current outpatient prescriptions:pantoprazole (PROTONIX) 40 MG tablet, Take 40 mg by mouth daily., Disp: , Rfl: ;  clonazePAM (KLONOPIN) 2 MG tablet, Take 3 mg by mouth daily. , Disp: , Rfl: ;  Fluvoxamine Maleate 150 MG CP24, Take 150 mg by mouth at bedtime., Disp: , Rfl: ;  glucose blood (FREESTYLE LITE) test strip, USE TO TEST BLOOD SUGAR TWICE DAILY, Disp: 100 each, Rfl: 3 hydrOXYzine (ATARAX/VISTARIL) 50 MG tablet, Take 1 tablet (50 mg total) by mouth 2 (two) times daily as needed., Disp: 60 tablet, Rfl: 1;  Lancets (FREESTYLE) lancets, Use to check blood sugar twice daily, Disp: 100 each, Rfl: 12;  pravastatin (PRAVACHOL) 40 MG tablet, TAKE 1 TABLET EVERY DAY, Disp: 90 tablet, Rfl: 0;  Saxagliptin-Metformin (  KOMBIGLYZE XR) 10-998 MG TB24, Take 1 tablet by mouth daily., Disp: 30 tablet, Rfl: 3  EXAM:  Filed Vitals:   04/16/14 1513  BP: 120/82  Pulse: 88  Temp: 97.9 F (36.6 C)    Body mass index is 38.82 kg/(m^2).  GENERAL: vitals reviewed and listed above, alert, oriented, appears well hydrated and in no acute distress  HEENT: atraumatic, conjunttiva clear, no obvious abnormalities on inspection of external nose and ears  NECK: no obvious masses on inspection  LUNGS: clear to auscultation bilaterally, no wheezes, rales or rhonchi, good air movement  CV: HRRR, no peripheral edema  MS: moves all extremities without noticeable abnormality  PSYCH: pleasant and cooperative, depressed mood  ASSESSMENT AND PLAN:  Discussed the following assessment and  plan:  Elevated blood pressure  Grief  Abnormal maternal glucose tolerance, with delivery  PANIC ATTACK  >30 minutes spent face to face counseling this patient -BP normal today, she is suffering from grief  -counseled extensively on grief, loss and advised to seek care with her psychiatrist for medication management and hospice for counseling as she really loved everyone involved through hospice with her grandmother -she is anxious about the BP - BP great today - follow up with PCP in 2 weeks to recheck -Patient advised to return or notify a doctor immediately if symptoms worsen or persist or new concerns arise.  Patient Instructions  -I am so sorry about your loss  -bereavement counseling  -sees your psychiatrist about medications  -follow up with Padonda in 2 weeks     KIM, HANNAH R.

## 2014-04-19 ENCOUNTER — Other Ambulatory Visit: Payer: Self-pay | Admitting: Family

## 2014-04-20 ENCOUNTER — Telehealth: Payer: Self-pay | Admitting: Family

## 2014-04-20 NOTE — Telephone Encounter (Signed)
Pt called to ask if her dematologist report has come back. She would like a call back she has some questions..Marland Kitchen

## 2014-04-21 NOTE — Telephone Encounter (Signed)
Spoke with pt and advised that we have not received dermatology report. Advised that she should contact the dermatologist to have them fax or refax the report

## 2014-04-23 ENCOUNTER — Encounter: Payer: Self-pay | Admitting: Family

## 2014-04-30 ENCOUNTER — Encounter: Payer: Self-pay | Admitting: Family

## 2014-05-01 ENCOUNTER — Telehealth: Payer: Self-pay | Admitting: Family

## 2014-05-01 ENCOUNTER — Encounter: Payer: Self-pay | Admitting: Family

## 2014-05-01 ENCOUNTER — Ambulatory Visit (INDEPENDENT_AMBULATORY_CARE_PROVIDER_SITE_OTHER)
Admission: RE | Admit: 2014-05-01 | Discharge: 2014-05-01 | Disposition: A | Discharge status: Home / Self Care | Payer: Medicare Other | Source: Ambulatory Visit | Attending: Family | Admitting: Family

## 2014-05-01 ENCOUNTER — Ambulatory Visit (INDEPENDENT_AMBULATORY_CARE_PROVIDER_SITE_OTHER): Payer: Medicare Other | Admitting: Family

## 2014-05-01 VITALS — BP 122/86 | HR 102 | Wt 230.6 lb

## 2014-05-01 DIAGNOSIS — S99921A Unspecified injury of right foot, initial encounter: Secondary | ICD-10-CM

## 2014-05-01 DIAGNOSIS — F70 Mild intellectual disabilities: Secondary | ICD-10-CM

## 2014-05-01 NOTE — Telephone Encounter (Signed)
Pt needs foot xray results that was done at [facility] today. Pt is aware NP out of office this afternoon

## 2014-05-01 NOTE — Patient Instructions (Addendum)
1. Contact psychiatry about anxiety  Foot Sprain The muscles and cord like structures which attach muscle to bone (tendons) that surround the feet are made up of units. A foot sprain can occur at the weakest spot in any of these units. This condition is most often caused by injury to or overuse of the foot, as from playing contact sports, or aggravating a previous injury, or from poor conditioning, or obesity. SYMPTOMS  Pain with movement of the foot.  Tenderness and swelling at the injury site.  Loss of strength is present in moderate or severe sprains. THE THREE GRADES OR SEVERITY OF FOOT SPRAIN ARE:  Mild (Grade I): Slightly pulled muscle without tearing of muscle or tendon fibers or loss of strength.  Moderate (Grade II): Tearing of fibers in a muscle, tendon, or at the attachment to bone, with small decrease in strength.  Severe (Grade III): Rupture of the muscle-tendon-bone attachment, with separation of fibers. Severe sprain requires surgical repair. Often repeating (chronic) sprains are caused by overuse. Sudden (acute) sprains are caused by direct injury or over-use. DIAGNOSIS  Diagnosis of this condition is usually by your own observation. If problems continue, a caregiver may be required for further evaluation and treatment. X-rays may be required to make sure there are not breaks in the bones (fractures) present. Continued problems may require physical therapy for treatment. PREVENTION  Use strength and conditioning exercises appropriate for your sport.  Warm up properly prior to working out.  Use athletic shoes that are made for the sport you are participating in.  Allow adequate time for healing. Early return to activities makes repeat injury more likely, and can lead to an unstable arthritic foot that can result in prolonged disability. Mild sprains generally heal in 3 to 10 days, with moderate and severe sprains taking 2 to 10 weeks. Your caregiver can help you determine  the proper time required for healing. HOME CARE INSTRUCTIONS   Apply ice to the injury for 15-20 minutes, 03-04 times per day. Put the ice in a plastic bag and place a towel between the bag of ice and your skin.  An elastic wrap (like an Ace bandage) may be used to keep swelling down.  Keep foot above the level of the heart, or at least raised on a footstool, when swelling and pain are present.  Try to avoid use other than gentle range of motion while the foot is painful. Do not resume use until instructed by your caregiver. Then begin use gradually, not increasing use to the point of pain. If pain does develop, decrease use and continue the above measures, gradually increasing activities that do not cause discomfort, until you gradually achieve normal use.  Use crutches if and as instructed, and for the length of time instructed.  Keep injured foot and ankle wrapped between treatments.  Massage foot and ankle for comfort and to keep swelling down. Massage from the toes up towards the knee.  Only take over-the-counter or prescription medicines for pain, discomfort, or fever as directed by your caregiver. SEEK IMMEDIATE MEDICAL CARE IF:   Your pain and swelling increase, or pain is not controlled with medications.  You have loss of feeling in your foot or your foot turns cold or blue.  You develop new, unexplained symptoms, or an increase of the symptoms that brought you to your caregiver. MAKE SURE YOU:   Understand these instructions.  Will watch your condition.  Will get help right away if you are not doing  well or get worse. Document Released: 12/02/2001 Document Revised: 09/04/2011 Document Reviewed: 01/30/2008 [facility] Patient Information 2015 [place] [place]. This information is not intended to replace advice given to you by your health care provider. Make sure you discuss any questions you have with your health care provider.

## 2014-05-01 NOTE — Telephone Encounter (Signed)
Please advise 

## 2014-05-01 NOTE — Progress Notes (Signed)
Pre visit review using our clinic review tool, if applicable. No additional management support is needed unless otherwise documented below in the visit note. 

## 2014-05-04 NOTE — Progress Notes (Signed)
Subjective:    Patient ID: Crystal CritchleyAnissa Nelson, female    DOB: 12/20/1970, 43 y.o.   MRN: [id number]  HPI 43 year old white female, nonsmoker, is in today with c/o right foot pain after a fall yesterday and landing on her foot. Pain 6/10, worse with movement. Has not been taking any medication for relief. Denies swelling.   Also has concerns of feeling she has a delayed response. She would like to be referred to neurology. Has a history of mild mental retardation. Symptoms have been ongoing x 2-3 months and worsening.    Review of Systems  Constitutional: Negative.   HENT: Negative.   Respiratory: Negative.   Cardiovascular: Negative.   Endocrine: Negative.   Genitourinary: Negative.   Musculoskeletal: Negative.   Skin: Negative.   Allergic/Immunologic: Negative.   Neurological: Negative.  Negative for tremors, speech difficulty, weakness and numbness.       Delayed mental response   Hematological: Negative.   Psychiatric/Behavioral: Negative.    Past Medical History  Diagnosis Date  . Obesity   . Dyslipidemia   . Diabetes mellitus   . Hyperlipidemia   . Obesity   . Tobacco use disorder   . Anxiety     Dr. Donnie Ahoobin Bridges, [facility]  . Depression     Clinical psychologist, [name]  . ADD (attention deficit disorder)   . Cystocele     sees gyn, Dr. Reinaldo MeekerHenle  . Rectocele   . PTSD (post-traumatic stress disorder)     History   Social History  . Marital Status: Married    Spouse Name: N/A    Number of Children: 2  . Years of Education: N/A   Occupational History  . Not on file.   Social History Main Topics  . Smoking status: Current Every Day Smoker -- 1.00 packs/day for 7 years    Last Attempt to Quit: 09/13/2011  . Smokeless tobacco: Former NeurosurgeonUser    Quit date: 08/11/2011  . Alcohol Use: No  . Drug Use: No  . Sexual Activity: Not on file     Comment: trying to get disability; lives with husband; has children, but mother has custody   Other Topics  Concern  . Not on file   Social History Narrative    Past Surgical History  Procedure Laterality Date  . Tubal ligation    . Cholecystectomy    . Diagnostic mammogram      never; sees Dr. Reinaldo MeekerHenle  . Abdominal hysterectomy    . Incontinence surgery  June 2012  . Bladder suspension    . Rectocele repair    . Cystocele repair      Family History  Problem Relation Age of Onset  . Depression Mother   . Hyperlipidemia Mother   . Hypertension Mother   . Diabetes Mother   . Hyperlipidemia Father   . Depression Father   . Nephrolithiasis Brother     Allergies  Allergen Reactions  . Codeine Nausea And Vomiting    vomiting  . Nitrofurantoin Other (See Comments)    "extreme dehydration" requiring hospitalization  . Sulfa Antibiotics Other (See Comments)    Pt does not remember reaction    Current Outpatient Prescriptions on File Prior to Visit  Medication Sig Dispense Refill  . clonazePAM (KLONOPIN) 2 MG tablet Take 3 mg by mouth daily.     . Fluvoxamine Maleate 150 MG CP24 Take 150 mg by mouth at bedtime.    Marland Kitchen. glucose blood (FREESTYLE LITE) test strip  USE TO TEST BLOOD SUGAR TWICE DAILY 100 each 3  . hydrOXYzine (ATARAX/VISTARIL) 50 MG tablet Take 1 tablet (50 mg total) by mouth 2 (two) times daily as needed. 60 tablet 1  . Lancets (FREESTYLE) lancets Use to check blood sugar twice daily 100 each 12  . pantoprazole (PROTONIX) 40 MG tablet Take 40 mg by mouth daily.    . pravastatin (PRAVACHOL) 40 MG tablet TAKE 1 TABLET EVERY DAY 90 tablet 0  . Saxagliptin-Metformin (KOMBIGLYZE XR) 10-998 MG TB24 Take 1 tablet by mouth daily. 30 tablet 3   No current facility-administered medications on file prior to visit.    BP 122/86 mmHg  Pulse 102  Wt 230 lb 9.6 oz (104.599 kg)  LMP 05/19/2012chart    Objective:   Physical Exam  Constitutional: She is oriented to person, place, and time. She appears well-developed and well-nourished.  HENT:  Right Ear: External ear normal.    Left Ear: External ear normal.  Nose: Nose normal.  Mouth/Throat: Oropharynx is clear and moist.  Neck: Normal range of motion. Neck supple.  Cardiovascular: Normal rate, regular rhythm and normal heart sounds.   Pulmonary/Chest: Effort normal and breath sounds normal.  Abdominal: Soft. Bowel sounds are normal.  Musculoskeletal: Normal range of motion.  Neurological: She is oriented to person, place, and time.  Skin: Skin is warm and dry.  Psychiatric: She has a normal mood and affect.          Assessment & Plan:  Crystal Glancenissa was seen today for follow-up.  Diagnoses and associated orders for this visit:  Right foot injury, initial encounter - DG Foot Complete Right; Future  Mild mental retardation  Mild mental slowing   Referral done at patent's request although psychiatry is more likely the specialist she should see. I advised her of this. She would like to be worked up for delayed mental response that has been worsening.

## 2014-05-04 NOTE — Telephone Encounter (Signed)
Pt aware.

## 2014-05-04 NOTE — Telephone Encounter (Signed)
Xray normal

## 2014-06-01 ENCOUNTER — Encounter: Payer: Self-pay | Admitting: Family

## 2014-06-01 ENCOUNTER — Ambulatory Visit (INDEPENDENT_AMBULATORY_CARE_PROVIDER_SITE_OTHER): Payer: Medicare Other | Admitting: Family

## 2014-06-01 VITALS — BP 118/70 | HR 85 | Wt 233.0 lb

## 2014-06-01 DIAGNOSIS — R590 Localized enlarged lymph nodes: Secondary | ICD-10-CM

## 2014-06-01 DIAGNOSIS — L981 Factitial dermatitis: Secondary | ICD-10-CM

## 2014-06-01 DIAGNOSIS — R599 Enlarged lymph nodes, unspecified: Secondary | ICD-10-CM

## 2014-06-01 DIAGNOSIS — F424 Excoriation (skin-picking) disorder: Secondary | ICD-10-CM

## 2014-06-01 MED ORDER — DOXYCYCLINE HYCLATE 100 MG PO TABS: 100.0000 mg | ORAL_TABLET | Freq: Two times a day (BID) | ORAL | Status: DC

## 2014-06-01 NOTE — Progress Notes (Signed)
Subjective:    Patient ID: Crystal CritchleyAnissa Nelson, female    DOB: 11/01/1970, 43 y.o.   MRN: [id number]  HPI  43 year old white female, 1 ppd smoker is today with c/o a swollen lymph node to the left neck. She is unsure of how long it has been present ut just noticed it this morning. She has a history of picking disorder and has multiple areas to her neck that are healing. Describes the node as tender when she presses it. Otherwise, no pain. No sneezing, coughing, or congestion. Has a history of Type 2 DM-uncontrolled, HTN, uncontrolled anxiety. She is under the care of psychiatry.   Review of Systems  Constitutional: Negative.   HENT: Negative.   Respiratory: Negative.   Cardiovascular: Negative.   Gastrointestinal: Negative.   Endocrine: Negative.   Genitourinary: Negative.   Musculoskeletal: Negative.   Skin:       Picks skin, related to anxiety  Allergic/Immunologic: Negative.   Neurological: Negative.   Hematological:       Back of the left neck lymph node enlargement.   Psychiatric/Behavioral: The patient is nervous/anxious.    Past Medical History  Diagnosis Date  . Obesity   . Dyslipidemia   . Diabetes mellitus   . Hyperlipidemia   . Obesity   . Tobacco use disorder   . Anxiety     [facility]. Don[facility] Ahoobin Bridges, [facility]  . Depression     Clinical psychologist, Erlinda Hongharles Reagan  . ADD (attention deficit disorder)   . Cystocele     sees gyn, Dr. Reinaldo MeekerHenle  . Rectocele   . PTSD (post-traumatic stress disorder)     History   Social History  . Marital Status: Married    Spouse Name: N/A    Number of Children: 2  . Years of Education: N/A   Occupational History  . Not on file.   Social History Main Topics  . Smoking status: Current Every Day Smoker -- 1.00 packs/day for 7 years    Last Attempt to Quit: 09/13/2011  . Smokeless tobacco: Former NeurosurgeonUser    Quit date: 08/11/2011  . Alcohol Use: No  . Drug Use: No  . Sexual Activity: Not on file     Comment: trying to get  disability; lives with husband; has children, but mother has custody   Other Topics Concern  . Not on file   Social History Narrative    Past Surgical History  Procedure Laterality Date  . Tubal ligation    . Cholecystectomy    . Diagnostic mammogram      never; sees Dr. Reinaldo MeekerHenle  . Abdominal hysterectomy    . Incontinence surgery  June 2012  . Bladder suspension    . Rectocele repair    . Cystocele repair      Family History  Problem Relation Age of Onset  . Depression Mother   . Hyperlipidemia Mother   . Hypertension Mother   . Diabetes Mother   . Hyperlipidemia Father   . Depression Father   . Nephrolithiasis Brother     Allergies  Allergen Reactions  . Codeine Nausea And Vomiting    vomiting  . Nitrofurantoin Other (See Comments)    "extreme dehydration" requiring hospitalization  . Sulfa Antibiotics Other (See Comments)    Pt does not remember reaction    Current Outpatient Prescriptions on File Prior to Visit  Medication Sig Dispense Refill  . clonazePAM (KLONOPIN) 2 MG tablet Take 3 mg by mouth daily.     .Marland Kitchen  Fluvoxamine Maleate 150 MG CP24 Take 150 mg by mouth at bedtime.    Marland Kitchen. glucose blood (FREESTYLE LITE) test strip USE TO TEST BLOOD SUGAR TWICE DAILY 100 each 3  . hydrOXYzine (ATARAX/VISTARIL) 50 MG tablet Take 1 tablet (50 mg total) by mouth 2 (two) times daily as needed. 60 tablet 1  . Lancets (FREESTYLE) lancets Use to check blood sugar twice daily 100 each 12  . pantoprazole (PROTONIX) 40 MG tablet Take 40 mg by mouth daily.    . pravastatin (PRAVACHOL) 40 MG tablet TAKE 1 TABLET EVERY DAY 90 tablet 0  . Saxagliptin-Metformin (KOMBIGLYZE XR) 10-998 MG TB24 Take 1 tablet by mouth daily. 30 tablet 3   No current facility-administered medications on file prior to visit.    BP 118/70 mmHg  Pulse 85  Wt 233 lb (105.688 kg)  LMP 05/19/2012chart    Objective:   Physical Exam  Constitutional: She is oriented to person, place, and time. She appears  well-developed and well-nourished.  HENT:  Right Ear: External ear normal.  Left Ear: External ear normal.  Nose: Nose normal.  Mouth/Throat: Oropharynx is clear and moist.  Neck: Normal range of motion. Neck supple.  Left occipital lymph node mildly enlarged. Minimal tenderness.   Cardiovascular: Normal rate, regular rhythm and normal heart sounds.   Pulmonary/Chest: Effort normal and breath sounds normal.  Musculoskeletal: Normal range of motion.  Lymphadenopathy:    She has cervical adenopathy.  Neurological: She is alert and oriented to person, place, and time.  Skin: Skin is warm and dry.  Multiple erythematous sores from picking on the neck, arms, chest and torso.   Psychiatric:  Anxious as always.           Assessment & Plan:  Crystal Glancenissa was seen today for no specified reason.  Diagnoses and associated orders for this visit:  Occipital lymphadenopathy  Skin-picking disorder  Other Orders - Discontinue: doxycycline (VIBRA-TABS) 100 MG tablet; Take 1 tablet (100 mg total) by mouth 2 (two) times daily.    Patient has 1 mildly enlarged lymph node to the left occipital area most likely due to picking. She has multiple areas of healing wounds on her neck.

## 2014-06-01 NOTE — Patient Instructions (Signed)

## 2014-06-01 NOTE — Progress Notes (Signed)
Pre visit review using our clinic review tool, if applicable. No additional management support is needed unless otherwise documented below in the visit note. 

## 2014-06-09 ENCOUNTER — Ambulatory Visit: Payer: Medicare Other | Admitting: Family

## 2014-06-17 ENCOUNTER — Ambulatory Visit (INDEPENDENT_AMBULATORY_CARE_PROVIDER_SITE_OTHER): Payer: Medicare Other | Admitting: Neurology

## 2014-06-17 ENCOUNTER — Encounter: Payer: Self-pay | Admitting: Neurology

## 2014-06-17 VITALS — BP 98/68 | HR 84 | Ht 66.0 in | Wt 229.2 lb

## 2014-06-17 DIAGNOSIS — R404 Transient alteration of awareness: Secondary | ICD-10-CM

## 2014-06-17 DIAGNOSIS — R413 Other amnesia: Secondary | ICD-10-CM

## 2014-06-17 NOTE — Patient Instructions (Addendum)
1. Open MRI brain with and without contrast      06/22/2014 [facility] [address] 11:45 appointment time 11:15 check in 2. Routine EEG, then plan for 24-hour EEG 3. Follow-up in 1 month
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
## 2014-06-20 ENCOUNTER — Encounter: Payer: Self-pay | Admitting: Neurology

## 2014-06-23 ENCOUNTER — Telehealth: Payer: Self-pay | Admitting: Neurology

## 2014-06-23 ENCOUNTER — Ambulatory Visit (INDEPENDENT_AMBULATORY_CARE_PROVIDER_SITE_OTHER): Payer: Medicare Other | Admitting: Neurology

## 2014-06-23 DIAGNOSIS — R413 Other amnesia: Secondary | ICD-10-CM

## 2014-06-23 DIAGNOSIS — R404 Transient alteration of awareness: Secondary | ICD-10-CM

## 2014-06-23 NOTE — Telephone Encounter (Signed)
Have you had a chance to review her results? 

## 2014-06-23 NOTE — Telephone Encounter (Signed)
Pt would like the MRI results please call [phone]

## 2014-06-24 NOTE — Telephone Encounter (Signed)
Pls let her know I received and reviewed the disc of her MRI, brain looks good, there is no evidence of tumor, stroke, bleed. There are minimal changes seen that are typical in patients with cholesterol issues and diabetes, overall unremarkable. Thanks

## 2014-06-24 NOTE — Telephone Encounter (Signed)
Patient was notified of result. 

## 2014-06-25 ENCOUNTER — Telehealth: Payer: Self-pay | Admitting: Neurology

## 2014-06-25 NOTE — Telephone Encounter (Signed)
Called patient eeg not resulted yet. Will call her when result is reviewed.

## 2014-06-25 NOTE — Telephone Encounter (Signed)
Pt  Would like the results of the test she took please call her at [phone]

## 2014-06-25 NOTE — Telephone Encounter (Signed)
Patient was notified of result & advisement. 

## 2014-06-25 NOTE — Telephone Encounter (Signed)
-----   Message from Van ClinesKaren M Aquino, MD sent at 06/25/2014  1:19 PM EST ----- Regarding: EEG results Pls let her know EEG normal, would proceed with 24-hr EEG as discussed. Thanks

## 2014-07-01 ENCOUNTER — Encounter: Payer: Self-pay | Admitting: Family

## 2014-07-01 ENCOUNTER — Ambulatory Visit (INDEPENDENT_AMBULATORY_CARE_PROVIDER_SITE_OTHER): Payer: Medicare Other | Admitting: Family

## 2014-07-01 ENCOUNTER — Telehealth: Payer: Self-pay | Admitting: Family

## 2014-07-01 VITALS — BP 122/84 | HR 87 | Temp 97.9°F | Wt 227.0 lb

## 2014-07-01 DIAGNOSIS — E118 Type 2 diabetes mellitus with unspecified complications: Secondary | ICD-10-CM

## 2014-07-01 DIAGNOSIS — E7800 Pure hypercholesterolemia, unspecified: Secondary | ICD-10-CM

## 2014-07-01 DIAGNOSIS — F411 Generalized anxiety disorder: Secondary | ICD-10-CM

## 2014-07-01 MED ORDER — FLUTICASONE PROPIONATE 50 MCG/ACT NA SUSP: 2.0000 | Freq: Every day | NASAL | Status: DC

## 2014-07-01 NOTE — Telephone Encounter (Signed)
Done

## 2014-07-01 NOTE — Telephone Encounter (Signed)
Pt request refill fluticasone (FLONASE) 50 MCG/ACT nasal spray  [facility]

## 2014-07-01 NOTE — Patient Instructions (Signed)
Diabetes and Standards of Medical Care Diabetes is complicated. You may find that your diabetes team includes a dietitian, nurse, diabetes educator, eye doctor, and more. To help everyone know what is going on and to help you get the care you deserve, the following schedule of care was developed to help keep you on track. Below are the tests, exams, vaccines, medicines, education, and plans you will need. HbA1c test This test shows how well you have controlled your glucose over the past 2-3 months. It is used to see if your diabetes management plan needs to be adjusted.   It is performed at least 2 times a year if you are meeting treatment goals.  It is performed 4 times a year if therapy has changed or if you are not meeting treatment goals. Blood pressure test  This test is performed at every routine medical visit. The goal is less than 140/90 mm Hg for most people, but 130/80 mm Hg in some cases. Ask your health care provider about your goal. Dental exam  Follow up with the dentist regularly. Eye exam  If you are diagnosed with type 1 diabetes as a child, get an exam upon reaching the age of 37 years or older and have had diabetes for 3-5 years. Yearly eye exams are recommended after that initial eye exam.  If you are diagnosed with type 1 diabetes as an adult, get an exam within 5 years of diagnosis and then yearly.  If you are diagnosed with type 2 diabetes, get an exam as soon as possible after the diagnosis and then yearly. Foot care exam  Visual foot exams are performed at every routine medical visit. The exams check for cuts, injuries, or other problems with the feet.  A comprehensive foot exam should be done yearly. This includes visual inspection as well as assessing foot pulses and testing for loss of sensation.  Check your feet nightly for cuts, injuries, or other problems with your feet. Tell your health care provider if anything is not healing. Kidney function test (urine  microalbumin)  This test is performed once a year.  Type 1 diabetes: The first test is performed 5 years after diagnosis.  Type 2 diabetes: The first test is performed at the time of diagnosis.  A serum creatinine and estimated glomerular filtration rate (eGFR) test is done once a year to assess the level of chronic kidney disease (CKD), if present. Lipid profile (cholesterol, HDL, LDL, triglycerides)  Performed every 5 years for most people.  The goal for LDL is less than 100 mg/dL. If you are at high risk, the goal is less than 70 mg/dL.  The goal for HDL is 40 mg/dL-50 mg/dL for men and 50 mg/dL-60 mg/dL for women. An HDL cholesterol of 60 mg/dL or higher gives some protection against heart disease.  The goal for triglycerides is less than 150 mg/dL. Influenza vaccine, pneumococcal vaccine, and hepatitis B vaccine  The influenza vaccine is recommended yearly.  It is recommended that people with diabetes who are over 24 years old get the pneumonia vaccine. In some cases, two separate shots may be given. Ask your health care provider if your pneumonia vaccination is up to date.  The hepatitis B vaccine is also recommended for adults with diabetes. Diabetes self-management education  Education is recommended at diagnosis and ongoing as needed. Treatment plan  Your treatment plan is reviewed at every medical visit. Document Released: 04/09/2009 Document Revised: 10/27/2013 Document Reviewed: 11/12/2012 [facility] Patient Information 2015 [place],  [employer]. This information is not intended to replace advice given to you by your health care provider. Make sure you discuss any questions you have with your health care provider.  

## 2014-07-01 NOTE — Procedures (Signed)
ELECTROENCEPHALOGRAM REPORT  Date of Study: 06/23/2014  Patient's Name: [name] MRN: [id number] Date of Birth: 10/13/1970  Referring Provider: Dr. Patrcia DollyKaren Tiffane Sheldon  Clinical History: This is a 44 year old woman with emory loss and worsening cognitive changes where she describes processing difficulties and episodes where she feels she is "not there."  Medications: Clonazepam, Lexapro, Atarax, Pravastatin, Kombiglyze  Technical Summary: A multichannel digital EEG recording measured by the international 10-20 system with electrodes applied with paste and impedances below 5000 ohms performed in our laboratory with EKG monitoring in an awake and drowsy patient.  Hyperventilation and photic stimulation were performed.  The digital EEG was referentially recorded, reformatted, and digitally filtered in a variety of bipolar and referential montages for optimal display.    Description: The patient is awake and drowsy during the recording.  During maximal wakefulness, there is a symmetric, medium voltage 10 Hz posterior dominant rhythm that attenuates with eye opening.  The record is symmetric.  During drowsiness, there is an increase in theta slowing of the background. Low voltage beta activity is seen over the parasagittal derivations in drowsiness. Hyperventilation and photic stimulation did not elicit any abnormalities.  There were no epileptiform discharges or electrographic seizures seen.    EKG lead was unremarkable.  Impression: This awake and drowsy EEG is normal.    Clinical Correlation: A normal EEG does not exclude a clinical diagnosis of epilepsy.  If further clinical questions remain, prolonged EEG may be helpful.  Clinical correlation is advised.   Patrcia DollyKaren Manuel Lawhead, [name]

## 2014-07-01 NOTE — Progress Notes (Signed)
Subjective:    Patient ID: [name], female    DOB: 1971/01/09, 44 y.o.   MRN: [id number]  HPI 44 year old white female, one pack per day smoker with a history of type 2 diabetes, bipolar disorder, and hypercholesterolemia is in today for recheck. Reports she is doing well. Blood glucose between 126 and 140. Not routinely exercising. Try to follow healthy diet.    Review of Systems  Constitutional: Negative.   HENT: Negative.   Respiratory: Negative.   Cardiovascular: Negative.   Gastrointestinal: Negative.   Endocrine: Negative.   Genitourinary: Negative.   Musculoskeletal: Negative.   Skin: Negative.   Allergic/Immunologic: Negative.   Neurological: Negative.   Hematological: Negative.   Psychiatric/Behavioral: Negative.    Past Medical History  Diagnosis Date  . Obesity   . Dyslipidemia   . Diabetes mellitus   . Hyperlipidemia   . Obesity   . Tobacco use disorder   . Anxiety     Dr. Donnie Aho, [facility]  . Depression     Clinical psychologist, Erlinda Hong  . ADD (attention deficit disorder)   . Cystocele     sees gyn, Dr. Reinaldo Meeker  . Rectocele   . PTSD (post-traumatic stress disorder)     History   Social History  . Marital Status: Married    Spouse Name: N/A    Number of Children: 2  . Years of Education: N/A   Occupational History  . Not on file.   Social History Main Topics  . Smoking status: Current Every Day Smoker -- 1.00 packs/day for 7 years    Last Attempt to Quit: 09/13/2011  . Smokeless tobacco: Former Neurosurgeon    Quit date: 08/11/2011  . Alcohol Use: No  . Drug Use: No  . Sexual Activity: Not on file     Comment: trying to get disability; lives with husband; has children, but mother has custody   Other Topics Concern  . Not on file   Social History Narrative    Past Surgical History  Procedure Laterality Date  . Tubal ligation    . Cholecystectomy    . Diagnostic mammogram      never; sees Dr. Reinaldo Meeker  .  Abdominal hysterectomy    . Incontinence surgery  June 2012  . Bladder suspension    . Rectocele repair    . Cystocele repair      Family History  Problem Relation Age of Onset  . Depression Mother   . Hyperlipidemia Mother   . Hypertension Mother   . Diabetes Mother   . Hyperlipidemia Father   . Depression Father   . Nephrolithiasis Brother     Allergies  Allergen Reactions  . Codeine Nausea And Vomiting    vomiting  . Nitrofurantoin Other (See Comments)    "extreme dehydration" requiring hospitalization  . Sulfa Antibiotics Other (See Comments)    Pt does not remember reaction    Current Outpatient Prescriptions on File Prior to Visit  Medication Sig Dispense Refill  . clonazePAM (KLONOPIN) 2 MG tablet Take 3 mg by mouth daily.     Marland Kitchen escitalopram (LEXAPRO) 10 MG tablet Take 10 mg by mouth daily.  0  . glucose blood (FREESTYLE LITE) test strip USE TO TEST BLOOD SUGAR TWICE DAILY 100 each 3  . hydrOXYzine (ATARAX/VISTARIL) 50 MG tablet Take 1 tablet (50 mg total) by mouth 2 (two) times daily as needed. 60 tablet 1  . Lancets (FREESTYLE) lancets Use to check blood sugar twice daily  100 each 12  . pantoprazole (PROTONIX) 40 MG tablet Take 40 mg by mouth daily.    . pravastatin (PRAVACHOL) 40 MG tablet TAKE 1 TABLET EVERY DAY 90 tablet 0  . Saxagliptin-Metformin (KOMBIGLYZE XR) 10-998 MG TB24 Take 1 tablet by mouth daily. 30 tablet 3   No current facility-administered medications on file prior to visit.    BP 122/84 mmHg  Pulse 87  Temp(Src) 97.9 F (36.6 C) (Oral)  Wt 227 lb (102.967 kg)  LMP 05/19/2012chart    Objective:   Physical Exam  Constitutional: She is oriented to person, place, and time. She appears well-developed and well-nourished.  HENT:  Right Ear: External ear normal.  Left Ear: External ear normal.  Nose: Nose normal.  Mouth/Throat: Oropharynx is clear and moist.  Neck: Normal range of motion. Neck supple. No thyromegaly present.    Cardiovascular: Normal rate, regular rhythm and normal heart sounds.   Pulmonary/Chest: Effort normal and breath sounds normal.  Abdominal: Soft. Bowel sounds are normal.  Musculoskeletal: Normal range of motion.  Neurological: She is alert and oriented to person, place, and time.  Skin: Skin is warm and dry.  Psychiatric: She has a normal mood and affect.          Assessment & Plan:  [name] was seen today for follow-up.  Diagnoses and associated orders for this visit:  Type 2 diabetes mellitus with complication - Basic Metabolic Panel; Future - Hepatic Function Panel; Future - Hemoglobin A1c; Future  Generalized anxiety disorder  Pure hypercholesterolemia - Basic Metabolic Panel; Future - Hepatic Function Panel; Future - Lipid Panel; Future    Encouraged healthy diet and exercise. Nightly feet checks.

## 2014-07-01 NOTE — Progress Notes (Signed)
Pre visit review using our clinic review tool, if applicable. No additional management support is needed unless otherwise documented below in the visit note. 

## 2014-07-02 ENCOUNTER — Other Ambulatory Visit: Payer: Medicare Other

## 2014-07-02 LAB — LIPID PANEL
CHOL/HDL RATIO: 4
Cholesterol: 170 mg/dL (ref 0–200)
HDL: 42.1 mg/dL (ref >39.00)
LDL CALC: 98 mg/dL (ref 0–99)
NonHDL: 127.9
TRIGLYCERIDES: 150 mg/dL — AB (ref 0.0–149.0)
VLDL: 30 mg/dL (ref 0.0–40.0)

## 2014-07-02 LAB — HEPATIC FUNCTION PANEL
ALBUMIN: 3.9 g/dL (ref 3.5–5.2)
ALT: 16 U/L (ref 0–35)
AST: 16 U/L (ref 0–37)
Alkaline Phosphatase: 67 U/L (ref 39–117)
Bilirubin, Direct: 0.1 mg/dL (ref 0.0–0.3)
Total Bilirubin: 0.4 mg/dL (ref 0.2–1.2)
Total Protein: 7.2 g/dL (ref 6.0–8.3)

## 2014-07-02 LAB — HEMOGLOBIN A1C: HEMOGLOBIN A1C: 7.2 % — AB (ref 4.6–6.5)

## 2014-07-02 LAB — BASIC METABOLIC PANEL
BUN: 16 mg/dL (ref 6–23)
CALCIUM: 8.9 mg/dL (ref 8.4–10.5)
CO2: 28 mEq/L (ref 19–32)
Chloride: 102 mEq/L (ref 96–112)
Creatinine, Ser: 0.7 mg/dL (ref 0.4–1.2)
GFR: 99.71 mL/min (ref >60.00)
GLUCOSE: 131 mg/dL — AB (ref 70–99)
Potassium: 4.3 mEq/L (ref 3.5–5.1)
Sodium: 137 mEq/L (ref 135–145)

## 2014-07-02 NOTE — Addendum Note (Signed)
Addended by: Bonnye FavaKWEI, NANA K on: 07/02/2014 10:25 AM   Modules accepted: Orders

## 2014-07-03 ENCOUNTER — Encounter: Payer: Self-pay | Admitting: Family

## 2014-07-06 ENCOUNTER — Encounter: Payer: Self-pay | Admitting: Family

## 2014-07-15 ENCOUNTER — Encounter: Payer: Self-pay | Admitting: Neurology

## 2014-07-16 ENCOUNTER — Encounter: Payer: Self-pay | Admitting: Family

## 2014-07-19 ENCOUNTER — Other Ambulatory Visit: Payer: Self-pay | Admitting: Family

## 2014-07-20 ENCOUNTER — Encounter: Payer: Self-pay | Admitting: Neurology

## 2014-07-20 ENCOUNTER — Ambulatory Visit (INDEPENDENT_AMBULATORY_CARE_PROVIDER_SITE_OTHER): Payer: Medicare Other | Admitting: Neurology

## 2014-07-20 VITALS — BP 116/78 | HR 62 | Resp 16 | Ht 66.0 in | Wt 233.0 lb

## 2014-07-20 DIAGNOSIS — R413 Other amnesia: Secondary | ICD-10-CM

## 2014-07-20 DIAGNOSIS — R404 Transient alteration of awareness: Secondary | ICD-10-CM
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
## 2014-07-20 NOTE — Patient Instructions (Signed)
1. Proceed with 24-hour EEG as scheduled, take your medications as instructed 2. Continue working with your psychiatrist and therapist 3. Follow-up after EEG

## 2014-07-23 ENCOUNTER — Other Ambulatory Visit: Payer: Self-pay | Admitting: Family

## 2014-07-25 ENCOUNTER — Telehealth: Payer: Self-pay | Admitting: Family

## 2014-07-25 DIAGNOSIS — J0120 Acute ethmoidal sinusitis, unspecified: Secondary | ICD-10-CM

## 2014-07-25 MED ORDER — AMOXICILLIN-POT CLAVULANATE 875-125 MG PO TABS: 1.0000 | ORAL_TABLET | Freq: Two times a day (BID) | ORAL | Status: DC

## 2014-07-25 NOTE — Progress Notes (Signed)

## 2014-07-27 ENCOUNTER — Encounter: Payer: Self-pay | Admitting: Family

## 2014-07-27 ENCOUNTER — Other Ambulatory Visit: Payer: Self-pay

## 2014-07-27 MED ORDER — FLUCONAZOLE 150 MG PO TABS
150.0000 mg | ORAL_TABLET | Freq: Once | ORAL | Status: DC
Start: 2014-07-27 — End: 2014-09-13

## 2014-08-05 ENCOUNTER — Other Ambulatory Visit: Payer: Medicare Other

## 2014-08-06 ENCOUNTER — Telehealth: Payer: Self-pay | Admitting: Family

## 2014-08-06 NOTE — Telephone Encounter (Signed)
FYI

## 2014-08-06 NOTE — Telephone Encounter (Signed)
Noted  

## 2014-08-06 NOTE — Telephone Encounter (Signed)
[facility] - Client TELEPHONE ADVICE RECORD [facility] Patient Name: Crystal CritchleyNISSA Jourdan DOB: 04/11/1971 Initial Comment Caller states she fell down some steps on Tuesday evening, having back pain, left hip pain. Under her ribs. Nurse Assessment Nurse: Chrys RacerKoenig, RN, Alexia FreestoneAnna Marie Date/Time Lamount Cohen(Eastern Time): 08/06/2014 11:58:18 AM Confirm and document reason for call. If symptomatic, describe symptoms. ---Caller states she fell down some steps on Tuesday evening, having back pain, left hip pain. Under her ribs. The fall took place tues evening, 5 steps, landed on the steps (carpeted) no open wounds. Hurts on both sides of hips Has the patient traveled out of the country within the last 30 days? ---No Does the patient require triage? ---Yes Related visit to physician within the last 2 weeks? ---No Does the PT have any chronic conditions? (i.e. diabetes, asthma, etc.) ---Yes List chronic conditions. ---diabetes, takes a pill Did the patient indicate they were pregnant? ---No Guidelines Guideline Title Affirmed Question Affirmed Notes Back Injury Back swelling, bruise or pain from direct blow to the back (all triage questions negative) Final Disposition User Home Care CetroniaKoenig, RN, Alexia FreestoneAnna Marie

## 2014-08-12 ENCOUNTER — Telehealth: Payer: Self-pay | Admitting: Family

## 2014-08-12 DIAGNOSIS — J988 Other specified respiratory disorders: Secondary | ICD-10-CM

## 2014-08-12 NOTE — Progress Notes (Signed)
We are sorry that you are not feeling well.  Here is how we plan to help!  Based on what you have shared with me it looks like you have sinusitis.  Sinusitis is inflammation and infection in the sinus cavities of the head.  Based on your presentation I believe you most likely have Acute Bacterial sinusitis.  This is an infection caused by bacteria and is treated with antibiotics. You may use an oral decongestant such as Mucinex D or if you have glaucoma or high blood pressure use plain Mucinex.  Saline nasal sprays help an can sefely be used as often as needed for congestion.  If you develop worsening sinus pain, fever or notice severe headache and vision changes, or if symptoms are not better after completion of antibiotic, please schedule an appointment with a health care provider.  Since your congestion is still persisting, I would say give Afrin nasal spray a try, but for no more than 3 days. You can also use the suggestions above. I would also utilize Claritin (Loratidine) to assist with any congestion that may be related to allergins. Cut back on smoking or quit smoking to assist with this sinus/bronchial irritation as well. The above is all over-the-counter. Due to clear drainage, I would say it is either allergy-related or related to a virus since the Augmentin would likely work very well for bacterial causes.   Sinus infections are not as easily transmitted as other respiratory infection, however we still recommend that you avoid close contact with loved ones, especially the very young and elderly.  Remember to wash your hands thoroughly throughout the day as this is the number one way to prevent the spread of infection!  Home Care:  Only take medications as instructed by your medical team.  Complete the entire course of an antibiotic.  Do not take these medications with alcohol.  A steam or ultrasonic humidifier can help congestion.  You can place a towel over your head and breathe in the  steam from hot water coming from a faucet.  Avoid close contacts especially the very young and the elderly.  Cover your mouth when you cough or sneeze.  Always remember to wash your hands.  Get Help Right Away If:  You develop worsening fever or sinus pain.  You develop a severe head ache or visual changes.  Your symptoms persist after you have completed your treatment plan.  Make sure you  Understand these instructions.  Will watch your condition.  Will get help right away if you are not doing well or get worse.  Your e-visit answers were reviewed by a board certified advanced clinical practitioner to complete your personal care plan.  Depending on the condition, your plan could have included both over the counter or prescription medications.  If there is a problem please reply  once you have received a response from your provider.  Your safety is important to us.  If you have drug allergies check your prescription carefully.    You can use MyChart to ask questions about today's visit, request a non-urgent call back, or ask for a work or school excuse.  You will get an e-mail in the next two days asking about your experience.  I hope that your e-visit has been valuable and will speed your recovery. Thank you for using e-visits.

## 2014-08-13 ENCOUNTER — Ambulatory Visit: Payer: Medicare Other | Admitting: Family Medicine

## 2014-08-25 ENCOUNTER — Telehealth: Payer: Self-pay | Admitting: Family

## 2014-08-26 ENCOUNTER — Ambulatory Visit (INDEPENDENT_AMBULATORY_CARE_PROVIDER_SITE_OTHER): Payer: Medicare Other | Admitting: Family

## 2014-08-26 ENCOUNTER — Ambulatory Visit: Payer: Medicare Other | Admitting: Neurology

## 2014-08-26 ENCOUNTER — Encounter: Payer: Self-pay | Admitting: Family

## 2014-08-26 VITALS — BP 118/68 | HR 100 | Temp 98.2°F | Wt 231.1 lb

## 2014-08-26 DIAGNOSIS — K5900 Constipation, unspecified: Secondary | ICD-10-CM | POA: Diagnosis not present

## 2014-08-26 MED ORDER — GLUCOSE BLOOD VI STRP: ORAL_STRIP | Status: DC

## 2014-08-26 NOTE — Patient Instructions (Signed)

## 2014-08-26 NOTE — Progress Notes (Signed)
Pre visit review using our clinic review tool, if applicable. No additional management support is needed unless otherwise documented below in the visit note. 

## 2014-08-26 NOTE — Telephone Encounter (Signed)
error 

## 2014-08-26 NOTE — Progress Notes (Signed)
Subjective:    Patient ID: [name], female    DOB: 12/27/1970, 44 y.o.   MRN: [id number]  HPI 44 y.o. White female presents today with chief complaint of "I have an umbilical hernia that is bothering me". Pt states that she was told by her OBGYN "years ago" that she has an umbilical hernia and should have it checked eventually. She presents today saying that she can feel the hernia when she bends over. It is causing occasional abdominal pain that she describes as sharp, it occurs occasionally every other day, it is worse with activity and she has not taken anything for the pain. Pt also states that she has started having constipation, only having a bowel movement every 3-4 days. She has been taking colace which has shown some improvement. Pt states laxitives upset her stomach. Denies fever, fatigue, chills and change in appetite.   Past Medical History  Diagnosis Date  . Obesity   . Dyslipidemia   . Diabetes mellitus   . Hyperlipidemia   . Obesity   . Tobacco use disorder   . Anxiety     Dr. Donnie Ahoobin Bridges, [facility]  . Depression     Clinical psychologist, Erlinda Hongharles Reagan  . ADD (attention deficit disorder)   . Cystocele     sees gyn, Dr. Reinaldo MeekerHenle  . Rectocele   . PTSD (post-traumatic stress disorder)     History   Social History  . Marital Status: Married    Spouse Name: N/A  . Number of Children: 2  . Years of Education: N/A   Occupational History  . Not on file.   Social History Main Topics  . Smoking status: Current Every Day Smoker -- 1.00 packs/day for 7 years    Last Attempt to Quit: 09/13/2011  . Smokeless tobacco: Former NeurosurgeonUser    Quit date: 08/11/2011  . Alcohol Use: No  . Drug Use: No  . Sexual Activity: Not on file     Comment: trying to get disability; lives with husband; has children, but mother has custody   Other Topics Concern  . Not on file   Social History Narrative    Past Surgical History  Procedure Laterality Date  . Tubal  ligation    . Cholecystectomy    . Diagnostic mammogram      never; sees Dr. Reinaldo MeekerHenle  . Abdominal hysterectomy    . Incontinence surgery  June 2012  . Bladder suspension    . Rectocele repair    . Cystocele repair      Family History  Problem Relation Age of Onset  . Depression Mother   . Hyperlipidemia Mother   . Hypertension Mother   . Diabetes Mother   . Hyperlipidemia Father   . Depression Father   . Nephrolithiasis Brother     Allergies  Allergen Reactions  . Codeine Nausea And Vomiting    vomiting  . Nitrofurantoin Other (See Comments)    "extreme dehydration" requiring hospitalization  . Sulfa Antibiotics Other (See Comments)    Pt does not remember reaction    Current Outpatient Prescriptions on File Prior to Visit  Medication Sig Dispense Refill  . amoxicillin-clavulanate (AUGMENTIN) 875-125 MG per tablet Take 1 tablet by mouth 2 (two) times daily. 14 tablet 0  . clonazePAM (KLONOPIN) 2 MG tablet Take 3 mg by mouth daily.     Marland Kitchen. escitalopram (LEXAPRO) 10 MG tablet Take 10 mg by mouth daily.  0  . fluconazole (DIFLUCAN) 150 MG tablet Take  1 tablet (150 mg total) by mouth once. 1 tablet 1  . fluticasone (FLONASE) 50 MCG/ACT nasal spray Place 2 sprays into both nostrils daily. 16 g 6  . hydrOXYzine (ATARAX/VISTARIL) 50 MG tablet Take 1 tablet (50 mg total) by mouth 2 (two) times daily as needed. 60 tablet 1  . hydrOXYzine (VISTARIL) 50 MG capsule TAKE ONE CAPSULE BY MOUTH TWICE A DAY AS NEEDED 60 capsule 3  . KOMBIGLYZE XR 10-998 MG TB24 TAKE 1 TABLET EVERY DAY 30 tablet 3  . Lancets (FREESTYLE) lancets Use to check blood sugar twice daily 100 each 12  . pantoprazole (PROTONIX) 40 MG tablet Take 40 mg by mouth daily.    . pravastatin (PRAVACHOL) 40 MG tablet TAKE 1 TABLET EVERY DAY 90 tablet 0   No current facility-administered medications on file prior to visit.    BP 118/68 mmHg  Pulse 100  Temp(Src) 98.2 F (36.8 C) (Oral)  Wt 231 lb 1.6 oz (104.826 kg)   LMP 05/19/2012chart  Review of Systems  Constitutional: Negative.  Negative for fever, chills, appetite change and fatigue.  Respiratory: Negative.  Negative for shortness of breath.   Cardiovascular: Negative.  Negative for chest pain.  Gastrointestinal: Positive for abdominal pain, constipation and abdominal distention.  Musculoskeletal: Negative.   Neurological: Negative.        Objective:   Physical Exam  Constitutional: She is oriented to person, place, and time. She appears well-developed and well-nourished. She is active.  Cardiovascular: Normal rate, regular rhythm and normal pulses.   Pulmonary/Chest: Effort normal and breath sounds normal.  Abdominal: Soft. Bowel sounds are normal.  No hernia palpable. Denies tenderness to palpation.   Neurological: She is alert and oriented to person, place, and time.          Assessment & Plan:  [name] was seen today for hernia.  Diagnoses and all orders for this visit:  Constipation, unspecified constipation type Orders: -     DG Abd 1 View; Future  Other orders -     glucose blood (FREESTYLE LITE) test strip; USE TO TEST BLOOD SUGAR TWICE DAILY  PT education on proper fluid intake and high fiber diet.

## 2014-08-27 ENCOUNTER — Telehealth: Payer: Medicare Other | Admitting: Physician Assistant

## 2014-08-27 DIAGNOSIS — H5710 Ocular pain, unspecified eye: Secondary | ICD-10-CM

## 2014-08-27 LAB — HM DIABETES EYE EXAM

## 2014-08-27 NOTE — Progress Notes (Signed)
Based on what you shared with me it looks like you have a serious condition that should be evaluated in a face to face office visit.  If you are having significant eye pain or change in vision, you need to be seen so that a good examination can be done to find out what is going on.  If you are having a true medical emergency please call 911.  If you need an urgent face to face visit, [facility] has four urgent care centers for your convenience.  Tressie Ellis. [facility]  [phone] Get Driving Directions Find a Provider at this Location  [address] [place], [place] [address] . 8 am to 8 pm Monday-Friday . 9 am to 7 pm Saturday-Sunday  . [facility]  [phone] Get Driving Directions Find a Provider at this Location  [address] [place], [place] [address] . 8 am to 8 pm Monday-Friday . 9 am to 6 pm Saturday . 11 am to 6 pm Sunday   . [facility]  [phone] Get Driving Directions  [address] [place], [place] [address] . 8 am to 8 pm Monday-Friday . 9 am to 4 pm Saturday-Sunday   . [facility] (a walk in primary care provider)  [phone]  Get Driving Directions Find a Provider at this Location  [address] [place], [place] [address] . 8 am to 8:30 pm Monday-Thursday . 8 am to 6 pm Friday . 8 am to 4 pm Saturday-Sunday   Your e-visit answers were reviewed by a board certified advanced clinical practitioner to complete your personal care plan.  Depending on the condition, your plan could have included both over the counter or prescription medications.  You will get an e-mail in the next two days asking about your experience.  I hope that your e-visit has been valuable and will speed your recovery . Thank you for choosing an e-visit.

## 2014-08-28 ENCOUNTER — Other Ambulatory Visit: Payer: Self-pay | Admitting: Family

## 2014-08-28 MED ORDER — GLUCOSE BLOOD VI STRP: ORAL_STRIP | Status: DC

## 2014-08-28 NOTE — Telephone Encounter (Signed)
Rx resent to [facility] on [address].

## 2014-08-28 NOTE — Telephone Encounter (Signed)
Pharm called b/c the pt's  glucose blood (FREESTYLE LITE) test strip are not being approved by her plan.  When they try to run [address] states part b will pay, and they run part b and it states part d will pay. Any advice? [facility]

## 2014-08-31 ENCOUNTER — Ambulatory Visit (INDEPENDENT_AMBULATORY_CARE_PROVIDER_SITE_OTHER)
Admission: RE | Admit: 2014-08-31 | Discharge: 2014-08-31 | Disposition: A | Discharge status: Home / Self Care | Payer: Medicare Other | Source: Ambulatory Visit | Attending: Family | Admitting: Family

## 2014-08-31 DIAGNOSIS — K5900 Constipation, unspecified: Secondary | ICD-10-CM

## 2014-09-02 ENCOUNTER — Encounter: Payer: Self-pay | Admitting: Family

## 2014-09-03 ENCOUNTER — Encounter: Payer: Self-pay | Admitting: Family

## 2014-09-13 ENCOUNTER — Telehealth: Payer: Medicare Other | Admitting: Physician Assistant

## 2014-09-13 DIAGNOSIS — R399 Unspecified symptoms and signs involving the genitourinary system: Secondary | ICD-10-CM

## 2014-09-13 MED ORDER — FLUCONAZOLE 150 MG PO TABS: 150.0000 mg | ORAL_TABLET | Freq: Once | ORAL | Status: DC

## 2014-09-13 MED ORDER — CIPROFLOXACIN HCL 500 MG PO TABS: 500.0000 mg | ORAL_TABLET | Freq: Two times a day (BID) | ORAL | Status: DC

## 2014-09-13 NOTE — Progress Notes (Signed)
We are sorry that you are not feeling well.  Here is how we plan to help!  Based on what you shared with me it looks like you most likely have a simple urinary tract infection.  A UTI (Urinary Tract Infection) is a bacterial infection of the bladder.  Most cases of urinary tract infections are simple to treat but a key part of your care is to encourage you to drink plenty of fluids and watch your symptoms carefully.  I have prescribed Ciprofloxacin 500 mg twice a day for 5 days.  Your symptoms should gradually improve. Call us if the burning in your urine worsens, you develop worsening fever, back pain or pelvic pain or if your symptoms do not resolve after completing the antibiotic.  I have also sent in 1 tablet of Diflucan in case you have issues with vaginal yeast from the antibiotic use.  Urinary tract infections can be prevented by drinking plenty of water to keep your body hydrated.  Also be sure when you wipe, wipe from front to back and don't hold it in!  If possible, empty your bladder every 4 hours.  Your e-visit answers were reviewed by a board certified advanced clinical practitioner to complete your personal care plan.  Depending on the condition, your plan could have included both over the counter or prescription medications.  If there is a problem please reply  once you have received a response from your provider.  Your safety is important to us.  If you have drug allergies check your prescription carefully.    You can use MyChart to ask questions about today's visit, request a non-urgent call back, or ask for a work or school excuse.  You will get an e-mail in the next two days asking about your experience.  I hope that your e-visit has been valuable and will speed your recovery. Thank you for using e-visits.

## 2014-09-17 ENCOUNTER — Telehealth: Payer: Self-pay | Admitting: Neurology

## 2014-09-17 NOTE — Telephone Encounter (Signed)
Upon calling to confirm upcoming 24-hour EEG [name] stated she would like to cancel her appointment she is having several other procedures this week and will call us back to reschedule if she decides to move forward with this test.

## 2014-09-21 ENCOUNTER — Other Ambulatory Visit: Payer: Medicare Other

## 2014-09-22 ENCOUNTER — Emergency Department (HOSPITAL_COMMUNITY)
Admission: EM | Admit: 2014-09-22 | Discharge: 2014-09-23 | Disposition: A | Discharge status: Home / Self Care | Payer: Medicare Other | Attending: Emergency Medicine | Admitting: Emergency Medicine

## 2014-09-22 ENCOUNTER — Encounter (HOSPITAL_COMMUNITY): Payer: Self-pay

## 2014-09-22 ENCOUNTER — Emergency Department (HOSPITAL_COMMUNITY): Payer: Medicare Other

## 2014-09-22 DIAGNOSIS — F329 Major depressive disorder, single episode, unspecified: Secondary | ICD-10-CM | POA: Insufficient documentation

## 2014-09-22 DIAGNOSIS — Z79899 Other long term (current) drug therapy: Secondary | ICD-10-CM | POA: Insufficient documentation

## 2014-09-22 DIAGNOSIS — E119 Type 2 diabetes mellitus without complications: Secondary | ICD-10-CM | POA: Insufficient documentation

## 2014-09-22 DIAGNOSIS — Z720 Tobacco use: Secondary | ICD-10-CM | POA: Diagnosis not present

## 2014-09-22 DIAGNOSIS — E669 Obesity, unspecified: Secondary | ICD-10-CM | POA: Insufficient documentation

## 2014-09-22 DIAGNOSIS — IMO0002 Reserved for concepts with insufficient information to code with codable children: Secondary | ICD-10-CM

## 2014-09-22 DIAGNOSIS — M545 Low back pain, unspecified: Secondary | ICD-10-CM

## 2014-09-22 DIAGNOSIS — F419 Anxiety disorder, unspecified: Secondary | ICD-10-CM | POA: Diagnosis not present

## 2014-09-22 DIAGNOSIS — N941 Dyspareunia: Secondary | ICD-10-CM | POA: Insufficient documentation

## 2014-09-22 DIAGNOSIS — R110 Nausea: Secondary | ICD-10-CM

## 2014-09-22 DIAGNOSIS — K5900 Constipation, unspecified: Secondary | ICD-10-CM | POA: Insufficient documentation

## 2014-09-22 DIAGNOSIS — R1030 Lower abdominal pain, unspecified: Secondary | ICD-10-CM | POA: Diagnosis present

## 2014-09-22 DIAGNOSIS — E785 Hyperlipidemia, unspecified: Secondary | ICD-10-CM | POA: Diagnosis not present

## 2014-09-22 DIAGNOSIS — R1031 Right lower quadrant pain: Secondary | ICD-10-CM

## 2014-09-22 LAB — COMPREHENSIVE METABOLIC PANEL
ALT: 14 U/L (ref 0–35)
ANION GAP: 10 (ref 5–15)
AST: 17 U/L (ref 0–37)
Albumin: 4.3 g/dL (ref 3.5–5.2)
Alkaline Phosphatase: 65 U/L (ref 39–117)
BUN: 13 mg/dL (ref 6–23)
CO2: 29 mmol/L (ref 19–32)
Calcium: 9.4 mg/dL (ref 8.4–10.5)
Chloride: 100 mmol/L (ref 96–112)
Creatinine, Ser: 0.72 mg/dL (ref 0.50–1.10)
GFR calc non Af Amer: >90 mL/min (ref >90)
Glucose, Bld: 125 mg/dL — ABNORMAL HIGH (ref 70–99)
Potassium: 4.2 mmol/L (ref 3.5–5.1)
Sodium: 139 mmol/L (ref 135–145)
Total Bilirubin: 0.5 mg/dL (ref 0.3–1.2)
Total Protein: 7.5 g/dL (ref 6.0–8.3)

## 2014-09-22 LAB — CBC
HCT: 40.8 % (ref 36.0–46.0)
Hemoglobin: 13.2 g/dL (ref 12.0–15.0)
MCH: 29.2 pg (ref 26.0–34.0)
MCHC: 32.4 g/dL (ref 30.0–36.0)
MCV: 90.3 fL (ref 78.0–100.0)
Platelets: 265 10*3/uL (ref 150–400)
RBC: 4.52 MIL/uL (ref 3.87–5.11)
RDW: 13.2 % (ref 11.5–15.5)
WBC: 9.7 10*3/uL (ref 4.0–10.5)

## 2014-09-22 LAB — URINALYSIS, ROUTINE W REFLEX MICROSCOPIC
BILIRUBIN URINE: NEGATIVE
Glucose, UA: NEGATIVE mg/dL
Hgb urine dipstick: NEGATIVE
Ketones, ur: NEGATIVE mg/dL
Leukocytes, UA: NEGATIVE
Nitrite: NEGATIVE
PROTEIN: NEGATIVE mg/dL
Specific Gravity, Urine: 1.016 (ref 1.005–1.030)
Urobilinogen, UA: 1 mg/dL (ref 0.0–1.0)
pH: 6.5 (ref 5.0–8.0)

## 2014-09-22 MED ORDER — POLYETHYLENE GLYCOL 3350 17 G PO PACK: 17.0000 g | PACK | Freq: Every day | ORAL | Status: DC

## 2014-09-22 MED ORDER — LORAZEPAM 2 MG/ML IJ SOLN: 1.0000 mg | Freq: Once | INTRAMUSCULAR | Status: DC

## 2014-09-22 MED ORDER — IOHEXOL 300 MG/ML  SOLN
100.0000 mL | Freq: Once | INTRAMUSCULAR | Status: AC | PRN
  Administered 2014-09-22: 100 mL via INTRAVENOUS

## 2014-09-22 NOTE — ED Provider Notes (Signed)
CSN: [id number]     Arrival date & time 09/22/14  2029 History   First MD Initiated Contact with Patient 09/22/14 2150     Chief Complaint  Patient presents with  . Groin Pain     (Consider location/radiation/quality/duration/timing/severity/associated sxs/prior Treatment) HPI Comments: [name] is a 44 y.o. female with a PMHx of DM2, HLD, IBS, obesity, anxiety, depression, ADD, uterine fibroid (noted on 2009 U/S), cystocele, rectocele, and PSHx of tubal ligation, cholecystectomy, abdominal hysterectomy, bladder suspension, and rectocele/cystocele repair, who presents to the ED with complaints of intermittent low back/lower abdominal pain 2 weeks. She reports the pain is 7/10, intermittent, low back radiating into the lower abdomen/suprapubic/groin area, waxing and waning in severity, sharp, worse with sexual intercourse, and improved with tramadol and Tylenol. She states that she saw her OB/GYN Dr. Reinaldo Meeker yesterday who performed a pelvic exam, STD check, lab work, and pelvic ultrasound which she reports were "all negative". She states that she was instructed to return tomorrow to the office for another pelvic exam, and possibly set up for a CT scan to rule out intra-abdominal abscess, diverticulitis, "bowel leakage", or "issues with the bladder sling". Associated symptoms include intermittent nausea. She also reports that sometimes her "urethra" feels like "pressure" but denies pain with urination. She denies any fevers, chills, chest pain, shortness breath, vomiting, diarrhea, constipation, melena, hematochezia, dysuria, hematuria, urinary urgency or frequency, vaginal bleeding or discharge, numbness, tingling, weakness, cauda equina symptoms, rashes, or genital lesions. She denies any genital itching. Denies any trauma or heavy lifting. No sick contacts, suspicious food intake, EtOH or NSAID use.   Patient is a 44 y.o. female presenting with groin pain. The history is provided by the patient. No  language interpreter was used.  Groin Pain This is a new problem. The current episode started 1 to 4 weeks ago. The problem occurs intermittently. The problem has been waxing and waning. Associated symptoms include abdominal pain (intermittent lower abd pain) and nausea (intermittent). Pertinent negatives include no arthralgias, change in bowel habit, chest pain, chills, fever, myalgias, numbness, urinary symptoms, vomiting or weakness. The symptoms are aggravated by intercourse. She has tried acetaminophen and oral narcotics (tramadol and tylenol) for the symptoms. The treatment provided moderate relief.    Past Medical History  Diagnosis Date  . Obesity   . Dyslipidemia   . Diabetes mellitus   . Hyperlipidemia   . Obesity   . Tobacco use disorder   . Anxiety     Dr. Donnie Aho, [facility]  . Depression     Clinical psychologist, Erlinda Hong  . ADD (attention deficit disorder)   . Cystocele     sees gyn, Dr. Reinaldo Meeker  . Rectocele   . PTSD (post-traumatic stress disorder)    Past Surgical History  Procedure Laterality Date  . Tubal ligation    . Cholecystectomy    . Diagnostic mammogram      never; sees Dr. Reinaldo Meeker  . Abdominal hysterectomy    . Incontinence surgery  June 2012  . Bladder suspension    . Rectocele repair    . Cystocele repair     Family History  Problem Relation Age of Onset  . Depression Mother   . Hyperlipidemia Mother   . Hypertension Mother   . Diabetes Mother   . Hyperlipidemia Father   . Depression Father   . Nephrolithiasis Brother    History  Substance Use Topics  . Smoking status: Current Every Day Smoker -- 1.00 packs/day for 7  years    Last Attempt to Quit: 09/13/2011  . Smokeless tobacco: Former Neurosurgeon    Quit date: 08/11/2011  . Alcohol Use: No   OB History    No data available     Review of Systems  Constitutional: Negative for fever and chills.  Respiratory: Negative for shortness of breath.   Cardiovascular:  Negative for chest pain.  Gastrointestinal: Positive for nausea (intermittent) and abdominal pain (intermittent lower abd pain). Negative for vomiting, diarrhea, constipation, blood in stool, rectal pain and change in bowel habit.  Genitourinary: Positive for vaginal pain (intermittently at urethra, describes as pressure) and dyspareunia. Negative for dysuria, urgency, frequency, hematuria, flank pain, vaginal bleeding, vaginal discharge and genital sores.  Musculoskeletal: Positive for back pain (low back/SI joint area). Negative for myalgias and arthralgias.  Skin: Negative for color change.  Allergic/Immunologic: Positive for immunocompromised state (diabetic).  Neurological: Negative for weakness and numbness.  Psychiatric/Behavioral: Negative for confusion.   10 Systems reviewed and are negative for acute change except as noted in the HPI.    Allergies  Macrobid; Codeine; Nitrofurantoin; and Sulfa antibiotics  Home Medications   Prior to Admission medications   Medication Sig Start Date End Date Taking? Authorizing Provider  acetaminophen (TYLENOL) 500 MG tablet Take 1,000 mg by mouth every 6 (six) hours as needed for moderate pain.   Yes Historical Provider, MD  clonazePAM (KLONOPIN) 2 MG tablet Take 3 mg by mouth daily.    Yes Historical Provider, MD  escitalopram (LEXAPRO) 10 MG tablet Take 10 mg by mouth 2 (two) times daily.  05/06/14  Yes Historical Provider, MD  hydrOXYzine (ATARAX/VISTARIL) 50 MG tablet Take 1 tablet (50 mg total) by mouth 2 (two) times daily as needed. Patient taking differently: Take 50 mg by mouth at bedtime.  03/24/14  Yes Baker Pierini, FNP  ibuprofen (ADVIL,MOTRIN) 200 MG tablet Take 200-400 mg by mouth every 6 (six) hours as needed for moderate pain.   Yes Historical Provider, MD  KOMBIGLYZE XR 10-998 MG TB24 TAKE 1 TABLET EVERY DAY 07/19/14  Yes Baker Pierini, FNP  mometasone (NASONEX) 50 MCG/ACT nasal spray Place 2 sprays into the nose daily.    Yes Historical Provider, MD  pantoprazole (PROTONIX) 40 MG tablet Take 40 mg by mouth daily as needed (heart burn).    Yes Historical Provider, MD  pravastatin (PRAVACHOL) 40 MG tablet TAKE 1 TABLET EVERY DAY 07/19/14  Yes Baker Pierini, FNP  ranitidine (ZANTAC) 150 MG tablet Take 150 mg by mouth daily.   Yes Historical Provider, MD  traMADol (ULTRAM) 50 MG tablet Take 50 mg by mouth every 6 (six) hours as needed for moderate pain.   Yes Historical Provider, MD  Vitamin D, Cholecalciferol, 400 UNITS CAPS Take 400 Units by mouth daily.   Yes Historical Provider, MD  ciprofloxacin (CIPRO) 500 MG tablet Take 1 tablet (500 mg total) by mouth 2 (two) times daily. Patient not taking: Reported on 09/22/2014 09/13/14   Waldon Merl, PA-C  fluconazole (DIFLUCAN) 150 MG tablet Take 1 tablet (150 mg total) by mouth once. Patient not taking: Reported on 09/22/2014 09/13/14   Waldon Merl, PA-C  fluticasone [facility]) 50 MCG/ACT nasal spray Place 2 sprays into both nostrils daily. Patient not taking: Reported on 09/22/2014 07/01/14   Baker Pierini, FNP  glucose blood (FREESTYLE LITE) test strip USE TO TEST BLOOD SUGAR TWICE DAILY 08/28/14   Baker Pierini, FNP  hydrOXYzine (VISTARIL) 50 MG capsule TAKE ONE CAPSULE  BY MOUTH TWICE A DAY AS NEEDED Patient not taking: Reported on 09/22/2014 07/23/14   Baker PieriniPadonda B Campbell, FNP  Lancets (FREESTYLE) lancets Use to check blood sugar twice daily 11/28/13   Baker PieriniPadonda B Campbell, FNP   BP 140/74 mmHg  Pulse 75  Temp(Src) 98.7 F (37.1 C) (Oral)  Resp 18  SpO2 100%  LMP 11/12/2010 Physical Exam  Constitutional: She is oriented to person, place, and time. Vital signs are normal. She appears well-developed and well-nourished.  Non-toxic appearance. No distress.  Afebrile, nontoxic, NAD  HENT:  Head: Normocephalic and atraumatic.  Mouth/Throat: Oropharynx is clear and moist and mucous membranes are normal.  Eyes: Conjunctivae and EOM are normal. Right eye  exhibits no discharge. Left eye exhibits no discharge.  Neck: Normal range of motion. Neck supple.  Cardiovascular: Normal rate, regular rhythm, normal heart sounds and intact distal pulses.  Exam reveals no gallop and no friction rub.   No murmur heard. Pulmonary/Chest: Effort normal and breath sounds normal. No respiratory distress. She has no decreased breath sounds. She has no wheezes. She has no rhonchi. She has no rales.  Abdominal: Soft. Normal appearance and bowel sounds are normal. She exhibits no distension. There is tenderness in the right lower quadrant. There is no rigidity, no rebound, no guarding, no CVA tenderness, no tenderness at McBurney's point and negative Murphy's sign.    Soft, obese which limits exam, nondistended, +BS throughout, with mild RLQ TTP, no r/g/r, neg murphy's, neg mcburney's, no CVA TTP   Genitourinary:  Deferred due to recent pelvic exam yesterday  Musculoskeletal: Normal range of motion.       Lumbar back: She exhibits tenderness. She exhibits normal range of motion, no deformity and no spasm.       Back:  Lumbar spine with FROM intact without spinous process TTP, no bony stepoffs or deformities, no paraspinous muscle TTP or muscle spasms. Mild TTP to b/l SI joint areas. Strength 5/5 in all extremities, sensation grossly intact in all extremities, negative SLR bilaterally. No overlying skin changes.   Neurological: She is alert and oriented to person, place, and time. She has normal strength. No sensory deficit.  Skin: Skin is warm, dry and intact. No rash noted.  Psychiatric: She has a normal mood and affect.  Nursing note and vitals reviewed.   ED Course  Procedures (including critical care time) Labs Review Labs Reviewed  URINALYSIS, ROUTINE W REFLEX MICROSCOPIC - Abnormal; Notable for the following:    APPearance CLOUDY (*)    All other components within normal limits  COMPREHENSIVE METABOLIC PANEL - Abnormal; Notable for the following:     Glucose, Bld 125 (*)    All other components within normal limits  CBC  LIPASE, BLOOD    Imaging Review Ct Abdomen Pelvis W Contrast  09/22/2014   CLINICAL DATA:  Right lower quadrant abdominal pain. Low back, groin pain, and painful urination for 2 weeks.  EXAM: CT ABDOMEN AND PELVIS WITH CONTRAST  TECHNIQUE: Multidetector CT imaging of the abdomen and pelvis was performed using the standard protocol following bolus administration of intravenous contrast.  CONTRAST:  100mL OMNIPAQUE IOHEXOL 300 MG/ML  SOLN  COMPARISON:  None.  FINDINGS: Linear atelectasis in the right and left lower lobe.  Clips in the gallbladder fossa from cholecystectomy. No biliary dilatation. The liver, spleen, pancreas, and adrenal glands are normal. Kidneys demonstrate symmetric enhancement and excretion. There is no hydronephrosis or perinephric stranding. No localizing renal abnormality.  Stomach is physiologically distended with  contrast. There is a small hiatal hernia. There are no dilated or thickened bowel loops. The appendix is normal. Small to moderate volume of colonic stool. No colonic wall thickening. No free air, free fluid, or intra-abdominal fluid collection.  Abdominal aorta is normal in caliber. No retroperitoneal adenopathy. Small fat containing umbilical hernia.  Within the pelvis the urinary bladder is physiologically distended. The uterus is surgically absent. There is no adnexal mass. Both ovaries tentatively identified and normal. No pelvic free fluid.  There are no acute or suspicious osseous abnormalities. There are scattered bone islands. Mild facet arthropathy at L5-S1.  IMPRESSION: 1. No acute abnormality in the abdomen/pelvis. 2. Incidental note of small fat containing umbilical hernia. Mild facet arthropathy in the lower lumbar spine.   Electronically Signed   By: Rubye Oaks [name]   On: 09/22/2014 23:48     EKG Interpretation None      MDM   Final diagnoses:  Abdominal pain, right lower  quadrant  Bilateral low back pain without sciatica  Nausea  Dyspareunia  Constipation, unspecified constipation type    44 y.o. female here with low back/abd pain x2 wks, seen by her OBGYN yesterday, had labs/pelvic/STD check/ultrasound which she states were "normal". States she has f/up tomorrow for repeat pelvic exam and possible CT due to their concerns that she may have an intraabd abscess vs bladder sling issues vs "bowel leakage" vs diverticulitis causing her pain. On exam, pt has some RLQ TTP. CMP WNL, U/A WNL. CBC pending. Will obtain lipase and obtain CT. Doubt nephrolithiasis but this is on the differential, but since other bowel related conditions are higher on the differential, will obtain CT with contrast. Pt declines pain medications at this time, states the pain comes in waves and is currently not severe. Will reassess shortly.   11:54 PM CBC WNL. CT showing moderate stool burden but no acute etiology for pain. Will have her f/up with OBGYN tomorrow for ongoing care of her pelvic pain, no emergent cause noted at this time. Discussed continuing tramadol and tylenol, pt has pain contract therefore no narcotics to be given now. Will have her start miralax, increase fiber and water intake. I explained the diagnosis and have given explicit precautions to return to the ER including for any other new or worsening symptoms. The patient understands and accepts the medical plan as it's been dictated and I have answered their questions. Discharge instructions concerning home care and prescriptions have been given. The patient is STABLE and is discharged to home in good condition.  BP 140/74 mmHg  Pulse 75  Temp(Src) 98.7 F (37.1 C) (Oral)  Resp 18  SpO2 100%  LMP 11/12/2010  Meds ordered this encounter  Medications  . iohexol (OMNIPAQUE) 300 MG/ML solution 100 mL    Sig:   . polyethylene glycol (MIRALAX / GLYCOLAX) packet    Sig: Take 17 g by mouth daily.    Dispense:  14 each     Refill:  0    Order Specific Question:  Supervising Provider    Answer:  Eber Hong [[id number]]     Jaelin Devincentis Camprubi-Soms, PA-C 09/23/14 0015  Layla Maw Ward, DO 09/23/14 [id number]

## 2014-09-22 NOTE — ED Notes (Addendum)
Patient reports she has had pain in low back, groin area, painful sexual intercourse, and painful urination x 2 weeks.  She was seen by GYN yesterday.  She reports that pelvic ultrasound was performed, with no results.  She reports that pain comes in waves and occasionally causes nausea.  She reports she had a bladder sling placed in 2012.

## 2014-09-22 NOTE — Discharge Instructions (Signed)
Your abdominal pain could be due to constipation. Use miralax as directed. Increase fiber and water intake in your diet. See your OBGYN tomorrow at your appointment to discuss ongoing care of your pelvic pain. Return to the ER for changes or worsening symptoms.  Abdominal (belly) pain can be caused by many things. Your caregiver performed an examination and possibly ordered blood/urine tests and imaging (CT scan, x-rays, ultrasound). Many cases can be observed and treated at home after initial evaluation in the emergency department. Even though you are being discharged home, abdominal pain can be unpredictable. Therefore, you need a repeated exam if your pain does not resolve, returns, or worsens. Most patients with abdominal pain don't have to be admitted to the hospital or have surgery, but serious problems like appendicitis and gallbladder attacks can start out as nonspecific pain. Many abdominal conditions cannot be diagnosed in one visit, so follow-up evaluations are very important. SEEK IMMEDIATE MEDICAL ATTENTION IF YOU DEVELOP ANY OF THE FOLLOWING SYMPTOMS:  The pain does not go away or becomes severe.   A temperature above 101 develops.   Repeated vomiting occurs (multiple episodes).   The pain becomes localized to portions of the abdomen. The right side could possibly be appendicitis. In an adult, the left lower portion of the abdomen could be colitis or diverticulitis.   Blood is being passed in stools or vomit (bright red or black tarry stools).   Return also if you develop chest pain, difficulty breathing, dizziness or fainting, or become confused, poorly responsive, or inconsolable (young children).  The constipation stays for more than 4 days.   There is belly (abdominal) or rectal .   You do not seem to be getting better.     Abdominal Pain, Women Abdominal (stomach, pelvic, or belly) pain can be caused by many things. It is important to tell your doctor:  The location of  the pain.  Does it come and go or is it present all the time?  Are there things that start the pain (eating certain foods, exercise)?  Are there other symptoms associated with the pain (fever, nausea, vomiting, diarrhea)? All of this is helpful to know when trying to find the cause of the pain. CAUSES   Stomach: virus or bacteria infection, or ulcer.  Intestine: appendicitis (inflamed appendix), regional ileitis (Crohn's disease), ulcerative colitis (inflamed colon), irritable bowel syndrome, diverticulitis (inflamed diverticulum of the colon), or cancer of the stomach or intestine.  Gallbladder disease or stones in the gallbladder.  Kidney disease, kidney stones, or infection.  Pancreas infection or cancer.  Fibromyalgia (pain disorder).  Diseases of the female organs:  Uterus: fibroid (non-cancerous) tumors or infection.  Fallopian tubes: infection or tubal pregnancy.  Ovary: cysts or tumors.  Pelvic adhesions (scar tissue).  Endometriosis (uterus lining tissue growing in the pelvis and on the pelvic organs).  Pelvic congestion syndrome (female organs filling up with blood just before the menstrual period).  Pain with the menstrual period.  Pain with ovulation (producing an egg).  Pain with an IUD (intrauterine device, birth control) in the uterus.  Cancer of the female organs.  Functional pain (pain not caused by a disease, may improve without treatment).  Psychological pain.  Depression. DIAGNOSIS  Your doctor will decide the seriousness of your pain by doing an examination.  Blood tests.  X-rays.  Ultrasound.  CT scan (computed tomography, special type of X-ray).  MRI (magnetic resonance imaging).  Cultures, for infection.  Barium enema (dye inserted in the large  intestine, to better view it with X-rays).  Colonoscopy (looking in intestine with a lighted tube).  Laparoscopy (minor surgery, looking in abdomen with a lighted tube).  Major  abdominal exploratory surgery (looking in abdomen with a large incision). TREATMENT  The treatment will depend on the cause of the pain.   Many cases can be observed and treated at home.  Over-the-counter medicines recommended by your caregiver.  Prescription medicine.  Antibiotics, for infection.  Birth control pills, for painful periods or for ovulation pain.  Hormone treatment, for endometriosis.  Nerve blocking injections.  Physical therapy.  Antidepressants.  Counseling with a psychologist or psychiatrist.  Minor or major surgery. HOME CARE INSTRUCTIONS   Do not take laxatives, unless directed by your caregiver.  Take over-the-counter pain medicine only if ordered by your caregiver. Do not take aspirin because it can cause an upset stomach or bleeding.  Try a clear liquid diet (broth or water) as ordered by your caregiver. Slowly move to a bland diet, as tolerated, if the pain is related to the stomach or intestine.  Have a thermometer and take your temperature several times a day, and record it.  Bed rest and sleep, if it helps the pain.  Avoid sexual intercourse, if it causes pain.  Avoid stressful situations.  Keep your follow-up appointments and tests, as your caregiver orders.  If the pain does not go away with medicine or surgery, you may try:  Acupuncture.  Relaxation exercises (yoga, meditation).  Group therapy.  Counseling. SEEK MEDICAL CARE IF:   You notice certain foods cause stomach pain.  Your home care treatment is not helping your pain.  You need stronger pain medicine.  You want your IUD removed.  You feel faint or lightheaded.  You develop nausea and vomiting.  You develop a rash.  You are having side effects or an allergy to your medicine. SEEK IMMEDIATE MEDICAL CARE IF:   Your pain does not go away or gets worse.  You have a fever.  Your pain is felt only in portions of the abdomen. The right side could possibly be  appendicitis. The left lower portion of the abdomen could be colitis or diverticulitis.  You are passing blood in your stools (bright red or black tarry stools, with or without vomiting).  You have blood in your urine.  You develop chills, with or without a fever.  You pass out. MAKE SURE YOU:   Understand these instructions.  Will watch your condition.  Will get help right away if you are not doing well or get worse. Document Released: 04/09/2007 Document Revised: 10/27/2013 Document Reviewed: 04/29/2009 [facility] Patient Information 2015 [place], [place]. This information is not intended to replace advice given to you by your health care provider. Make sure you discuss any questions you have with your health care provider.  Constipation Constipation is when a person has fewer than three bowel movements a week, has difficulty having a bowel movement, or has stools that are dry, hard, or larger than normal. As people grow older, constipation is more common. If you try to fix constipation with medicines that make you have a bowel movement (laxatives), the problem may get worse. Long-term laxative use may cause the muscles of the colon to become weak. A low-fiber diet, not taking in enough fluids, and taking certain medicines may make constipation worse.  CAUSES   Certain medicines, such as antidepressants, pain medicine, iron supplements, antacids, and water pills.   Certain diseases, such as diabetes, irritable  bowel syndrome (IBS), thyroid disease, or depression.   Not drinking enough water.   Not eating enough fiber-rich foods.   Stress or travel.   Lack of physical activity or exercise.   Ignoring the urge to have a bowel movement.   Using laxatives too much.  SIGNS AND SYMPTOMS   Having fewer than three bowel movements a week.   Straining to have a bowel movement.   Having stools that are hard, dry, or larger than normal.   Feeling full or bloated.   Pain  in the lower abdomen.   Not feeling relief after having a bowel movement.  DIAGNOSIS  Your health care provider will take a medical history and perform a physical exam. Further testing may be done for severe constipation. Some tests may include:  A barium enema X-ray to examine your rectum, colon, and, sometimes, your small intestine.   A sigmoidoscopy to examine your lower colon.   A colonoscopy to examine your entire colon. TREATMENT  Treatment will depend on the severity of your constipation and what is causing it. Some dietary treatments include drinking more fluids and eating more fiber-rich foods. Lifestyle treatments may include regular exercise. If these diet and lifestyle recommendations do not help, your health care provider may recommend taking over-the-counter laxative medicines to help you have bowel movements. Prescription medicines may be prescribed if over-the-counter medicines do not work.  HOME CARE INSTRUCTIONS   Eat foods that have a lot of fiber, such as fruits, vegetables, whole grains, and beans.  Limit foods high in fat and processed sugars, such as french fries, hamburgers, cookies, candies, and soda.   A fiber supplement may be added to your diet if you cannot get enough fiber from foods.   Drink enough fluids to keep your urine clear or pale yellow.   Exercise regularly or as directed by your health care provider.   Go to the restroom when you have the urge to go. Do not hold it.   Only take over-the-counter or prescription medicines as directed by your health care provider. Do not take other medicines for constipation without talking to your health care provider first.  SEEK IMMEDIATE MEDICAL CARE IF:   You have bright red blood in your stool.   Your constipation lasts for more than 4 days or gets worse.   You have abdominal or rectal pain.   You have thin, pencil-like stools.   You have unexplained weight loss. MAKE SURE YOU:    Understand these instructions.  Will watch your condition.  Will get help right away if you are not doing well or get worse. Document Released: 03/10/2004 Document Revised: 06/17/2013 Document Reviewed: 03/24/2013 [facility] Patient Information 2015 [place], [place]. This information is not intended to replace advice given to you by your health care provider. Make sure you discuss any questions you have with your health care provider.  Pelvic Pain Pelvic pain is pain felt below the belly button and between your hips. It can be caused by many different things. It is important to get help right away. This is especially true for severe, sharp, or unusual pain that comes on suddenly.  HOME CARE  Only take medicine as told by your doctor.  Rest as told by your doctor.  Eat a healthy diet, such as fruits, vegetables, and lean meats.  Drink enough fluids to keep your pee (urine) clear or pale yellow, or as told.  Avoid sex (intercourse) if it causes pain.  Apply warm or cold  packs to your lower belly (abdomen). Use the type of pack that helps the pain.  Avoid situations that cause you stress.  Keep a journal to track your pain. Write down:  When the pain started.  Where it is located.  If there are things that seem to be related to the pain, such as food or your period.  Follow up with your doctor as told. GET HELP RIGHT AWAY IF:   You have heavy bleeding from the vagina.  You have more pelvic pain.  You feel lightheaded or pass out (faint).  You have chills.  You have pain when you pee or have blood in your pee.  You cannot stop having watery poop (diarrhea).  You cannot stop throwing up (vomiting).  You have a fever or lasting symptoms for more than 3 days.  You have a fever and your symptoms suddenly get worse.  You are being physically or sexually abused.  Your medicine does not help your pain.  You have fluid (discharge) coming from your vagina that is not  normal. MAKE SURE YOU:  Understand these instructions.  Will watch your condition.  Will get help if you are not doing well or get worse. Document Released: 11/29/2007 Document Revised: 12/12/2011 Document Reviewed: 10/02/2011 [facility] Patient Information 2015 [place]. This information is not intended to replace advice given to you by your health care provider. Make sure you discuss any questions you have with your health care provider.

## 2014-09-23 ENCOUNTER — Ambulatory Visit: Payer: Medicare Other | Admitting: Neurology

## 2014-09-23 LAB — LIPASE, BLOOD: LIPASE: 25 U/L (ref 11–59)

## 2014-09-28 ENCOUNTER — Encounter: Payer: Self-pay | Admitting: Neurology

## 2014-09-28 ENCOUNTER — Encounter: Payer: Self-pay | Admitting: Family

## 2014-09-28 ENCOUNTER — Ambulatory Visit (INDEPENDENT_AMBULATORY_CARE_PROVIDER_SITE_OTHER): Payer: Medicare Other | Admitting: Family

## 2014-09-28 ENCOUNTER — Other Ambulatory Visit (INDEPENDENT_AMBULATORY_CARE_PROVIDER_SITE_OTHER): Payer: Medicare Other

## 2014-09-28 VITALS — BP 110/68 | HR 62 | Temp 98.1°F | Resp 18 | Ht 66.0 in | Wt 230.0 lb

## 2014-09-28 DIAGNOSIS — E1140 Type 2 diabetes mellitus with diabetic neuropathy, unspecified: Secondary | ICD-10-CM

## 2014-09-28 DIAGNOSIS — R102 Pelvic and perineal pain: Secondary | ICD-10-CM

## 2014-09-28 DIAGNOSIS — F341 Dysthymic disorder: Secondary | ICD-10-CM | POA: Diagnosis not present

## 2014-09-28 LAB — HEMOGLOBIN A1C: Hgb A1c MFr Bld: 7 % — ABNORMAL HIGH (ref 4.6–6.5)

## 2014-09-28 MED ORDER — TRAMADOL HCL 50 MG PO TABS: 50.0000 mg | ORAL_TABLET | Freq: Four times a day (QID) | ORAL | Status: DC | PRN

## 2014-09-28 NOTE — Assessment & Plan Note (Signed)
Previous history of bladder sling. Has been seen by GYN and urology. She has a follow up with Urology to check the status of her bladder sling as it was noted the pain is directly above the sling. Has had ultrasounds and multiple pelvic exams which were normal. Continue current dosage of tramadol as needed at this time until follow up with Urology.

## 2014-09-28 NOTE — Progress Notes (Signed)
Pre visit review using our clinic review tool, if applicable. No additional management support is needed unless otherwise documented below in the visit note. 

## 2014-09-28 NOTE — Progress Notes (Signed)
Subjective:    Patient ID: Crystal CritchleyAnissa Nelson, female    DOB: 01/08/1971, 44 y.o.   MRN: [id number]  Chief Complaint  Patient presents with  . Establish Care    Has had issues with transvaginal sling, she has major vaginal pain and pelvic pain x6 month     HPI:  [name] is a 44 y.o. female who presents today to establish care with this provider and discuss   1) Transvaginal Sling - Had a recent internal exam and pelvic ultrasound and indicates that above where the pain she is having is where the bladder sling is. She has been referred back to the provider who placed the bladder sling. The urology office referred her back to GYN for her umbilical hernia. She has an appointment with urology on 10/07/2014 for evaluated of the bladder sling. Associated symptom of pain located in her low back, pelvis and radiating to both legs has been going on for about 6 months. Timing of the symptoms is worse in the middle of the day or contextually worse when sitting for long periods of time. Pain is described as having "knives down there." Intensity of the pain varies depending upon the situation situation, but right now 5/10. Tramadol eases the pain and works well with the Tylenol taken later.   2) Diabetes - Currently taking Kombiglyze. Indicates that her morning blood sugar. Eye exam has been completed. Foot exam has been completed.   Lab Results  Component Value Date   HGBA1C 7.2* 07/02/2014   3) Depression and anxiety - Being seen by [facility] for her anxiety and depression. Maintained on lexapro and klonopin.   Allergies  Allergen Reactions  . Macrobid [employer][Nitrofurantoin Monohyd Macro] Other (See Comments)    Drains fluids out and can not walk  . Codeine Nausea And Vomiting    vomiting  . Doxycycline   . Nitrofurantoin Other (See Comments)    "extreme dehydration" requiring hospitalization  . Sulfa Antibiotics Other (See Comments)    Pt does not remember reaction    Current Outpatient  Prescriptions on File Prior to Visit  Medication Sig Dispense Refill  . acetaminophen (TYLENOL) 500 MG tablet Take 1,000 mg by mouth every 6 (six) hours as needed for moderate pain.    . clonazePAM (KLONOPIN) 2 MG tablet Take 3 mg by mouth daily.     Marland Kitchen. escitalopram (LEXAPRO) 10 MG tablet Take 10 mg by mouth 2 (two) times daily.   0  . glucose blood (FREESTYLE LITE) test strip USE TO TEST BLOOD SUGAR TWICE DAILY 100 each 3  . hydrOXYzine (VISTARIL) 50 MG capsule TAKE ONE CAPSULE BY MOUTH TWICE A DAY AS NEEDED 60 capsule 3  . ibuprofen (ADVIL,MOTRIN) 200 MG tablet Take 200-400 mg by mouth every 6 (six) hours as needed for moderate pain.    Marland Kitchen. KOMBIGLYZE XR 10-998 MG TB24 TAKE 1 TABLET EVERY DAY 30 tablet 3  . Lancets (FREESTYLE) lancets Use to check blood sugar twice daily 100 each 12  . mometasone (NASONEX) 50 MCG/ACT nasal spray Place 2 sprays into the nose daily.    . pantoprazole (PROTONIX) 40 MG tablet Take 40 mg by mouth daily as needed (heart burn).     . polyethylene glycol (MIRALAX / GLYCOLAX) packet Take 17 g by mouth daily. 14 each 0  . pravastatin (PRAVACHOL) 40 MG tablet TAKE 1 TABLET EVERY DAY 90 tablet 0  . ranitidine (ZANTAC) 150 MG tablet Take 150 mg by mouth daily.    .Marland Kitchen  Vitamin D, Cholecalciferol, 400 UNITS CAPS Take 400 Units by mouth daily.     No current facility-administered medications on file prior to visit.    Past Medical History  Diagnosis Date  . Obesity   . Dyslipidemia   . Hyperlipidemia   . Obesity   . Tobacco use disorder   . Anxiety     Dr. Donnie Aho, [facility]  . Depression     Clinical psychologist, Erlinda Hong  . ADD (attention deficit disorder)   . Cystocele     sees gyn, Dr. Reinaldo Meeker  . Rectocele   . PTSD (post-traumatic stress disorder)   . Diabetes mellitus     Type II    Review of Systems  Eyes:       Denies changes in vision  Endocrine: Negative for polydipsia, polyphagia and polyuria.  Genitourinary: Positive for  pelvic pain.  Neurological: Positive for numbness (occasional in toes).  Psychiatric/Behavioral: Negative for suicidal ideas and dysphoric mood. The patient is not nervous/anxious.       Objective:    BP 110/68 mmHg  Pulse 62  Temp(Src) 98.1 F (36.7 C) (Oral)  Resp 18  Ht  (1.676 m)  Wt 230 lb (104.327 kg)  BMI 37.14 kg/m2  SpO2 95%  LMP 11/12/2010 Nursing note and vital signs reviewed.  Physical Exam  Constitutional: She is oriented to person, place, and time. She appears well-developed and well-nourished. No distress.  Obese female seated in the chair, with multiple lesions on her arms in various stages of healing. Dressed appropriately for the situation and appears her stated age.  Cardiovascular: Normal rate, regular rhythm, normal heart sounds and intact distal pulses.   Pulmonary/Chest: Effort normal and breath sounds normal.  Abdominal: Soft. Bowel sounds are normal. She exhibits no mass. There is no rebound and no guarding.  Genitourinary:  Deferred secondary to multiple pelvic exams and imaging.   Neurological: She is alert and oriented to person, place, and time.  Skin: Skin is warm and dry.  Psychiatric: She has a normal mood and affect. Her behavior is normal. Judgment and thought content normal.       Assessment & Plan:

## 2014-09-28 NOTE — Assessment & Plan Note (Signed)
Stable and managed by psychiatry. Continue current dosages of clonazepam and lexapro. Denies suicidal ideations. Follow up / treatment changes per psych.

## 2014-09-28 NOTE — Patient Instructions (Addendum)
Thank you for choosing [facility].  Summary/Instructions:  Please follow up with your urologist and GYN as discussed.  Plan for a physical at your convenience.   If your symptoms worsen or fail to improve, please contact our office for further instruction, or in case of emergency go directly to the emergency room at the closest medical facility.

## 2014-09-28 NOTE — Assessment & Plan Note (Signed)
Stable with current regimen. Previous A1c was slightly elevated at 7.2. Obtain A1c. Previous BMET about a week ago showed normal kidney function. Eye and foot exams are up to date. Continue current dosage of Kombiglyze. Follow up pending A1c level.

## 2014-09-30 ENCOUNTER — Ambulatory Visit: Payer: Medicare Other | Admitting: Family Medicine

## 2014-10-28 ENCOUNTER — Other Ambulatory Visit: Payer: Self-pay | Admitting: Family

## 2014-11-02 ENCOUNTER — Other Ambulatory Visit: Payer: Self-pay | Admitting: Urology

## 2014-11-05 ENCOUNTER — Encounter (HOSPITAL_BASED_OUTPATIENT_CLINIC_OR_DEPARTMENT_OTHER): Payer: Self-pay | Admitting: *Deleted

## 2014-11-05 NOTE — Progress Notes (Signed)
NPO AFTER MN.  ARRIVE AT 0600.  NEEDS ISTAT AND EKG. WILL TAKE AM MEDS W/ EXCEPTION NO DIABETIC PILL W/ SIPS OF WATER.

## 2014-11-06 NOTE — H&P (Signed)
History of Present Illness   I was consulted Dr Vernie Ammonsttelin regarding [name]'s pelvic pain. Approximately 4 years ago she had a TOT sling and was dry afterwards.  In the last 6 months she has been having a lot of pelvic pain. She has deep vaginal pain and/or rectal pain. She has suprapubic discomfort. It is beginning in her groin and going down both legs. She has low back pain. The pain is daily and appears to be getting worse over time. She is using 6-8 hydrocodone daily.  Recently she started leaking not associated awareness and she thinks the dampness or leakage may be from the vagina versus the bladder. She says she has no overt stress incontinence and denies urge incontinence and enuresis. Recently on a bad day she will wear 1 pad.  She voids every 2 hours and gets up once a night. She reports a reasonable flow and can double void a moderate amount.  She denies a history of kidney stones or urinary tract infections.  She is oral hypoglycemics. She has had a hysterectomy. She is prone constipation. Ciprofloxacin and Mobic did not help her. She is ALLERGIC TO DOXYCYCLINE.  There is no other modifying factors or associated signs or symptoms. There is no other aggravating or relieving factors. The presentation is moderate in severity and ongoing.    Past Medical History Problems  1. History of Anxiety (F41.9) 2. History of Attention Deficit Disorder Without Hyperactivity 3. History of Female stress incontinence (N39.3) 4. History of depression (Z86.59) 5. History of diabetes mellitus (Z86.39) 6. History of hypercholesterolemia (Z86.39) 7. History of Menses Abnormal Duration 8. History of Mild Mental Retardation 9. History of Murmur (R01.1)  Surgical History Problems  1. History of Cholecystectomy 2. History of Tubal Ligation 3. History of Vaginal Sling Operation For Stress Incontinence  Current Meds 1. Cephalexin 500 MG Oral Capsule; TAKE 1 CAPSULE 3 times daily;  Therapy:  30Apr2016 to (Evaluate:10May2016)  Requested for: 30Apr2016; Last  Rx:30Apr2016 Ordered 2. Fluconazole 150 MG Oral Tablet; TAKE 1 TABLET ONCE. MAY REPEAT IN 1 WEEK IF  STILL SYMPTOMATIC;  Therapy: 27Apr2016 to (Last Rx:27Apr2016)  Requested for: 27Apr2016 Ordered 3. HydrOXYzine HCl TABS;  Therapy: (Recorded:13Apr2016) to Recorded 4. KlonoPIN TABS;  Therapy: (Recorded:13Apr2016) to Recorded 5. Lexapro 20 MG Oral Tablet;  Therapy: (Recorded:13Apr2016) to Recorded 6. Meloxicam 15 MG Oral Tablet; TAKE 1 TABLET DAILY WITH FOOD;  Therapy: 18Apr2016 to (Complete:18May2016)  Requested for: 18Apr2016; Last  Rx:18Apr2016 Ordered 7. Ondansetron HCl - 8 MG Oral Tablet; TAKE 1 TABLET Every 6 hours PRN;  Therapy: 14Apr2016 to (Last Rx:14Apr2016)  Requested for: 14Apr2016 Ordered 8. OxyCODONE HCl - 10 MG Oral Tablet; 1-2 every 4 hours as needed for pain;  Therapy: 13Apr2016 to (Last Rx:26Apr2016) Ordered 9. Pravastatin Sodium 40 MG Oral Tablet;  Therapy: (Recorded:13Apr2016) to Recorded 10. TraMADol HCl - 50 MG Oral Tablet;   Therapy: (Recorded:13Apr2016) to Recorded 11. Vitamin D3 CAPS;   Therapy: (Recorded:13Apr2016) to Recorded 12. Zantac 150 Maximum Strength 150 MG Oral Tablet;   Therapy: (Recorded:13Apr2016) to Recorded  Allergies Medication  1. Codeine Derivatives 2. Macrobid CAPS 3. Sulfa Drugs 4. Doxycycline Hyclate CAPS  Family History Problems  1. Family history of Colon Cancer : Maternal Grandmother 2. Family history of Diabetes Mellitus : Maternal Grandfather 3. Family history of Family Health Status Number Of Children   2 sons 4. Family history of Heart Disease : Maternal Grandfather 5. Family history of Hematuria : Mother 696. Family history of Nephrolithiasis  Social  History Problems    Denied: History of Alcohol Use   Caffeine Use   Denied: History of Drug Use   Former smoker [phone](Z87.891)   smokes currently, for 8 years at 1 ppd   Marital History - Currently  Married   Occupation:   unemployed  Review of Systems Constitutional, skin, eye, otolaryngeal, hematologic/lymphatic, cardiovascular, pulmonary, endocrine, musculoskeletal, neurological and psychiatric system(s) were reviewed and pertinent findings if present are noted and are otherwise negative.  Genitourinary: vaginal discharge.  Gastrointestinal: constipation.    Vitals Vital Signs [Data Includes: Last 1 Day]  Recorded: 03May2016 01:10PM  Blood Pressure: 103 / 71 Temperature: 99.2 F Heart Rate: 87  Results/Data    Today [name] underwent a number of tests I personally reviewed.  Uroflowmetry: She voided 32 mL with a max flow of 6 mL/second.  Bladder scan: Bladder scan residual was 0 mL. Urine [Data Includes: Last 1 Day]   03May2016  COLOR AMBER   APPEARANCE CLOUDY   SPECIFIC GRAVITY 1.020   pH 6.5   GLUCOSE NEG mg/dL  BILIRUBIN NEG   KETONE NEG mg/dL  BLOOD NEG   PROTEIN NEG mg/dL  UROBILINOGEN 1 mg/dL  NITRITE NEG   LEUKOCYTE ESTERASE NEG   SQUAMOUS EPITHELIAL/HPF MANY   WBC NONE SEEN WBC/hpf  RBC 0-2 RBC/hpf  BACTERIA RARE   CRYSTALS NONE SEEN   CASTS NONE SEEN    Assessment Assessed  1. Chronic interstitial cystitis without hematuria (N30.10) 2. Urinary incontinence without sensory awareness (N39.42)  Plan Chronic interstitial cystitis without hematuria, Urinary incontinence without sensory awareness  1. Follow-up Schedule Surgery Office  Follow-up  Status: Complete  Done: 03May2016 Female pelvic pain  2. Complex Uroflowmetry; Status:Complete;   Done: 03May2016 3. 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 She could have interstitial cystitis. I believe it would be difficult for a sling to be the underlying cause of her pain syndrome, including pain in her rectal area, thigh, groin, and lower back. She does not have a lot of frequency and nocturia. She does have possibly some  urinary incontinence not associated with awareness.   Dr Vernie Ammonsttelin performed a negative cystoscopy on October 07, 2014, and she had a CT scan that was within normal limits recently in the emergency room.   [name] is going to undergo a hydrodistention. The rare risk of perforation was again described. She understands that I will not be prescribing chronic pain medicines moving forward.   I will send a copy of my note to Dr Vernie Ammonsttelin to keep him updated on the treatment course.  After a thorough review of the management options for the patient's condition the patient  elected to proceed with surgical therapy as noted above. We have discussed the potential benefits and risks of the procedure, side effects of the proposed treatment, the likelihood of the patient achieving the goals of the procedure, and any potential problems that might occur during the procedure or recuperation. Informed consent has been obtained.

## 2014-11-08 ENCOUNTER — Other Ambulatory Visit: Payer: Self-pay | Admitting: Family

## 2014-11-08 ENCOUNTER — Encounter (HOSPITAL_BASED_OUTPATIENT_CLINIC_OR_DEPARTMENT_OTHER): Payer: Self-pay | Admitting: Anesthesiology

## 2014-11-08 NOTE — Anesthesia Preprocedure Evaluation (Addendum)
Anesthesia Evaluation  Patient identified by MRN, date of birth, ID band Patient awake    Reviewed: Allergy & Precautions, NPO status , Patient's Chart, lab work & pertinent test results  Airway Mallampati: II  TM Distance: >3 FB Neck ROM: Full    Dental no notable dental hx. (+) Teeth Intact, Dental Advisory Given   Pulmonary Current Smoker,  breath sounds clear to auscultation  Pulmonary exam normal       Cardiovascular negative cardio ROS Normal cardiovascular examRhythm:Regular Rate:Normal     Neuro/Psych PSYCHIATRIC DISORDERS Anxiety Depression PTSD Panic attacks Claustrophobia   GI/Hepatic Neg liver ROS, GERD-  Medicated,  Endo/Other  diabetes, Type 2, Oral Hypoglycemic Agents  Renal/GU negative Renal ROS  negative genitourinary   Musculoskeletal negative musculoskeletal ROS (+)   Abdominal (+) + obese,   Peds negative pediatric ROS (+)  Hematology negative hematology ROS (+)   Anesthesia Other Findings   Reproductive/Obstetrics negative OB ROS                         Anesthesia Physical Anesthesia Plan  ASA: II  Anesthesia Plan: General   Post-op Pain Management:    Induction: Intravenous  Airway Management Planned: LMA  Additional Equipment:   Intra-op Plan:   Post-operative Plan: Extubation in OR  Informed Consent: I have reviewed the patients History and Physical, chart, labs and discussed the procedure including the risks, benefits and alternatives for the proposed anesthesia with the patient or authorized representative who has indicated his/her understanding and acceptance.   Dental advisory given  Plan Discussed with: CRNA  Anesthesia Plan Comments:         Anesthesia Quick Evaluation

## 2014-11-09 ENCOUNTER — Ambulatory Visit (HOSPITAL_BASED_OUTPATIENT_CLINIC_OR_DEPARTMENT_OTHER): Payer: Medicare Other | Admitting: Anesthesiology

## 2014-11-09 ENCOUNTER — Encounter (HOSPITAL_BASED_OUTPATIENT_CLINIC_OR_DEPARTMENT_OTHER): Payer: Self-pay | Admitting: *Deleted

## 2014-11-09 ENCOUNTER — Encounter (HOSPITAL_BASED_OUTPATIENT_CLINIC_OR_DEPARTMENT_OTHER)
Admission: RE | Disposition: A | Discharge status: Home / Self Care | Payer: Self-pay | Source: Ambulatory Visit | Attending: Urology

## 2014-11-09 ENCOUNTER — Ambulatory Visit (HOSPITAL_BASED_OUTPATIENT_CLINIC_OR_DEPARTMENT_OTHER)
Admission: RE | Admit: 2014-11-09 | Discharge: 2014-11-09 | Disposition: A | Discharge status: Home / Self Care | Payer: Medicare Other | Source: Ambulatory Visit | Attending: Urology | Admitting: Urology

## 2014-11-09 DIAGNOSIS — E780 Pure hypercholesterolemia: Secondary | ICD-10-CM | POA: Insufficient documentation

## 2014-11-09 DIAGNOSIS — K5900 Constipation, unspecified: Secondary | ICD-10-CM | POA: Insufficient documentation

## 2014-11-09 DIAGNOSIS — Z882 Allergy status to sulfonamides status: Secondary | ICD-10-CM | POA: Insufficient documentation

## 2014-11-09 DIAGNOSIS — R102 Pelvic and perineal pain: Secondary | ICD-10-CM | POA: Diagnosis present

## 2014-11-09 DIAGNOSIS — E119 Type 2 diabetes mellitus without complications: Secondary | ICD-10-CM | POA: Insufficient documentation

## 2014-11-09 DIAGNOSIS — F79 Unspecified intellectual disabilities: Secondary | ICD-10-CM | POA: Diagnosis not present

## 2014-11-09 DIAGNOSIS — Z90710 Acquired absence of both cervix and uterus: Secondary | ICD-10-CM | POA: Diagnosis not present

## 2014-11-09 DIAGNOSIS — N3010 Interstitial cystitis (chronic) without hematuria: Secondary | ICD-10-CM | POA: Diagnosis not present

## 2014-11-09 DIAGNOSIS — Z886 Allergy status to analgesic agent status: Secondary | ICD-10-CM | POA: Diagnosis not present

## 2014-11-09 DIAGNOSIS — Z888 Allergy status to other drugs, medicaments and biological substances status: Secondary | ICD-10-CM | POA: Diagnosis not present

## 2014-11-09 DIAGNOSIS — F419 Anxiety disorder, unspecified: Secondary | ICD-10-CM | POA: Diagnosis not present

## 2014-11-09 DIAGNOSIS — F329 Major depressive disorder, single episode, unspecified: Secondary | ICD-10-CM | POA: Insufficient documentation

## 2014-11-09 DIAGNOSIS — F17210 Nicotine dependence, cigarettes, uncomplicated: Secondary | ICD-10-CM | POA: Insufficient documentation

## 2014-11-09 DIAGNOSIS — N393 Stress incontinence (female) (male): Secondary | ICD-10-CM | POA: Diagnosis not present

## 2014-11-09 HISTORY — DX: Presence of spectacles and contact lenses: Z97.3

## 2014-11-09 HISTORY — DX: Irritable bowel syndrome, unspecified: K58.9

## 2014-11-09 HISTORY — DX: Gastro-esophageal reflux disease without esophagitis: K21.9

## 2014-11-09 HISTORY — DX: Pelvic and perineal pain: R10.2

## 2014-11-09 HISTORY — DX: Generalized anxiety disorder: F41.1

## 2014-11-09 HISTORY — DX: Polyneuropathy, unspecified: G62.9

## 2014-11-09 HISTORY — DX: Personal history of other mental and behavioral disorders: Z86.59

## 2014-11-09 HISTORY — PX: CYSTO WITH HYDRODISTENSION: SHX5453

## 2014-11-09 HISTORY — DX: Frequency of micturition: R35.0

## 2014-11-09 HISTORY — DX: Dermatitis, unspecified: L30.9

## 2014-11-09 HISTORY — DX: Type 2 diabetes mellitus without complications: E11.9

## 2014-11-09 HISTORY — DX: Urgency of urination: R39.15

## 2014-11-09 LAB — GLUCOSE, CAPILLARY: GLUCOSE-CAPILLARY: 112 mg/dL — AB (ref 65–99)

## 2014-11-09 LAB — POCT I-STAT 4, (NA,K, GLUC, HGB,HCT)
GLUCOSE: 124 mg/dL — AB (ref 65–99)
HEMATOCRIT: 45 % (ref 36.0–46.0)
Hemoglobin: 15.3 g/dL — ABNORMAL HIGH (ref 12.0–15.0)
POTASSIUM: 4.2 mmol/L (ref 3.5–5.1)
Sodium: 142 mmol/L (ref 135–145)

## 2014-11-09 SURGERY — CYSTOSCOPY, WITH BLADDER HYDRODISTENSION
Anesthesia: General | Site: Bladder

## 2014-11-09 MED ORDER — ONDANSETRON HCL 4 MG/2ML IJ SOLN
INTRAMUSCULAR | Status: DC | PRN
  Administered 2014-11-09: 4 mg via INTRAVENOUS

## 2014-11-09 MED ORDER — HYDROCODONE-ACETAMINOPHEN 5-325 MG PO TABS: 1.0000 | ORAL_TABLET | Freq: Four times a day (QID) | ORAL | Status: DC | PRN

## 2014-11-09 MED ORDER — MIDAZOLAM HCL 2 MG/2ML IJ SOLN
INTRAMUSCULAR | Status: AC
  Filled 2014-11-09: qty 2

## 2014-11-09 MED ORDER — FENTANYL CITRATE (PF) 100 MCG/2ML IJ SOLN
INTRAMUSCULAR | Status: DC | PRN
  Administered 2014-11-09: 50 ug via INTRAVENOUS

## 2014-11-09 MED ORDER — BUPIVACAINE HCL (PF) 0.5 % IJ SOLN
INTRAMUSCULAR | Status: DC | PRN
  Administered 2014-11-09: 15 mL via INTRAVESICAL

## 2014-11-09 MED ORDER — CIPROFLOXACIN IN D5W 400 MG/200ML IV SOLN
400.0000 mg | INTRAVENOUS | Status: AC
  Administered 2014-11-09: 400 mg via INTRAVENOUS
  Filled 2014-11-09: qty 200

## 2014-11-09 MED ORDER — LACTATED RINGERS IV SOLN
INTRAVENOUS | Status: DC
  Administered 2014-11-09: 07:00:00 via INTRAVENOUS
  Filled 2014-11-09: qty 1000

## 2014-11-09 MED ORDER — STERILE WATER FOR IRRIGATION IR SOLN
Status: DC | PRN
  Administered 2014-11-09: 3000 mL

## 2014-11-09 MED ORDER — FENTANYL CITRATE (PF) 100 MCG/2ML IJ SOLN
INTRAMUSCULAR | Status: AC
  Filled 2014-11-09: qty 2

## 2014-11-09 MED ORDER — MIDAZOLAM HCL 5 MG/5ML IJ SOLN
INTRAMUSCULAR | Status: DC | PRN
  Administered 2014-11-09: 2 mg via INTRAVENOUS

## 2014-11-09 MED ORDER — CIPROFLOXACIN IN D5W 400 MG/200ML IV SOLN
INTRAVENOUS | Status: AC
  Filled 2014-11-09: qty 200

## 2014-11-09 MED ORDER — PROPOFOL 10 MG/ML IV BOLUS
INTRAVENOUS | Status: DC | PRN
  Administered 2014-11-09: 200 mg via INTRAVENOUS

## 2014-11-09 MED ORDER — LIDOCAINE HCL (CARDIAC) 20 MG/ML IV SOLN
INTRAVENOUS | Status: DC | PRN
  Administered 2014-11-09: 100 mg via INTRAVENOUS

## 2014-11-09 MED ORDER — DEXAMETHASONE SODIUM PHOSPHATE 4 MG/ML IJ SOLN
INTRAMUSCULAR | Status: DC | PRN
  Administered 2014-11-09: 10 mg via INTRAVENOUS

## 2014-11-09 SURGICAL SUPPLY — 23 items
BAG DRAIN URO-CYSTO SKYTR STRL (DRAIN) ×2 IMPLANT
BAG DRN UROCATH (DRAIN) ×1
CANISTER SUCT LVC 12 LTR MEDI- (MISCELLANEOUS) ×1 IMPLANT
CATH FOLEY 2WAY SLVR  5CC 18FR (CATHETERS)
CATH FOLEY 2WAY SLVR 5CC 18FR (CATHETERS) IMPLANT
CATH ROBINSON RED A/P 12FR (CATHETERS) IMPLANT
CATH ROBINSON RED A/P 14FR (CATHETERS) ×1 IMPLANT
CLOTH BEACON ORANGE TIMEOUT ST (SAFETY) ×2 IMPLANT
ELECT REM PT RETURN 9FT ADLT (ELECTROSURGICAL)
ELECTRODE REM PT RTRN 9FT ADLT (ELECTROSURGICAL) IMPLANT
GLOVE BIO SURGEON STRL SZ 6.5 (GLOVE) ×1 IMPLANT
GLOVE BIO SURGEON STRL SZ7.5 (GLOVE) ×2 IMPLANT
GLOVE INDICATOR 6.5 STRL GRN (GLOVE) ×1 IMPLANT
GOWN STRL REUS W/ TWL LRG LVL3 (GOWN DISPOSABLE) IMPLANT
GOWN STRL REUS W/ TWL XL LVL3 (GOWN DISPOSABLE) ×1 IMPLANT
GOWN STRL REUS W/TWL LRG LVL3 (GOWN DISPOSABLE) ×2
GOWN STRL REUS W/TWL XL LVL3 (GOWN DISPOSABLE) ×4
NDL SAFETY ECLIPSE 18X1.5 (NEEDLE) ×1 IMPLANT
NEEDLE HYPO 18GX1.5 SHARP (NEEDLE) ×2
PACK CYSTO (CUSTOM PROCEDURE TRAY) ×2 IMPLANT
SUT SILK 0 TIES 10X30 (SUTURE) IMPLANT
SYR 20CC LL (SYRINGE) ×2 IMPLANT
WATER STERILE IRR 3000ML UROMA (IV SOLUTION) ×2 IMPLANT

## 2014-11-09 NOTE — Interval H&P Note (Signed)
History and Physical Interval Note:  11/09/2014 7:08 AM  [name]  has presented today for surgery, with the diagnosis of pelvic pain  The various methods of treatment have been discussed with the patient and family. After consideration of risks, benefits and other options for treatment, the patient has consented to  Procedure(s): CYSTOSCOPY/HYDRODISTENSION/INSTILLATION OF MARCAINE AND PYRIDUM (N/A) as a surgical intervention .  The patient's history has been reviewed, patient examined, no change in status, stable for surgery.  I have reviewed the patient's chart and labs.  Questions were answered to the patient's satisfaction.     Ajmal Kathan A

## 2014-11-09 NOTE — Op Note (Signed)
Preoperative diagnosis: Pelvic pain Postoperative diagnosis: Pelvic pain Surgery: Cystoscopy and bladder hydrodistention and bladder installation therapy Surgeon: Dr. Lorin PicketScott Jannifer Fischler  The patient has the above diagnoses and consented above procedure. Preoperative antibiotics were given.  17.5 [place] scope was utilized. Bladder mucosa and trigone were normal. Is no stitch or foreign body or carcinoma. Is no sling in the bladder or urethra  She was hydrodistended to 750 mL. Bladder was emptied and reinspected. Water mucosa was normal and there is no findings in keeping with a diagnosis of interstitial cystitis  Utilizing a speculum I did not identify any evidence of vaginitis fistula or sling extrusion. There were no bands  As a separate procedure instilled 15 mL of 0.5% Marcaine and 400 mg of peridium  [name] does not have findings in keeping with a diagnosis of interstitial cystitis.

## 2014-11-09 NOTE — Anesthesia Procedure Notes (Signed)
Procedure Name: LMA Insertion Date/Time: 11/09/2014 7:37 AM Performed by: Tyrone NineSAUVE, Arraya Buck F Pre-anesthesia Checklist: Patient identified, Timeout performed, Emergency Drugs available, Suction available and Patient being monitored Patient Re-evaluated:Patient Re-evaluated prior to inductionOxygen Delivery Method: Circle system utilized Preoxygenation: Pre-oxygenation with 100% oxygen Intubation Type: IV induction Ventilation: Mask ventilation without difficulty LMA: LMA inserted LMA Size: 4.0 Number of attempts: 1 Placement Confirmation: breath sounds checked- equal and bilateral and positive ETCO2 Tube secured with: Tape Dental Injury: Teeth and Oropharynx as per pre-operative assessment

## 2014-11-09 NOTE — Discharge Instructions (Signed)
I have reviewed discharge instructions in detail with the patient. They will follow-up with me or their physician as scheduled. My nurse will also be calling the patients as per protocol.   CYSTOSCOPY HOME CARE INSTRUCTIONS  Activity: Rest for the remainder of the day.  Do not drive or operate equipment today.  You may resume normal activities in one to two days as instructed by your physician.   Meals: Drink plenty of liquids and eat light foods such as gelatin or soup this evening.  You may return to a normal meal plan tomorrow.  Return to Work: You may return to work in one to two days or as instructed by your physician.  Special Instructions / Symptoms: Call your physician if any of these symptoms occur:   -persistent or heavy bleeding  -bleeding which continues after first few urination  -large blood clots that are difficult to pass  -urine stream diminishes or stops completely  -fever equal to or higher than 101 degrees Farenheit.  -cloudy urine with a strong, foul odor  -severe pain  Females should always wipe from front to back after elimination.  You may feel some burning pain when you urinate.  This should disappear with time.  Applying moist heat to the lower abdomen or a hot tub bath may help relieve the pain. \   Patient Signature:  ________________________________________________________  Nurse's Signature:  ________________________________________________________   Post Anesthesia Home Care Instructions  Activity: Get plenty of rest for the remainder of the day. A responsible adult should stay with you for 24 hours following the procedure.  For the next 24 hours, DO NOT: -Drive a car -Advertising copywriterperate machinery -Drink alcoholic beverages -Take any medication unless instructed by your physician -Make any legal decisions or sign important papers.  Meals: Start with liquid foods such as gelatin or soup. Progress to regular foods as tolerated. Avoid greasy, spicy, heavy  foods. If nausea and/or vomiting occur, drink only clear liquids until the nausea and/or vomiting subsides. Call your physician if vomiting continues.  Special Instructions/Symptoms: Your throat may feel dry or sore from the anesthesia or the breathing tube placed in your throat during surgery. If this causes discomfort, gargle with warm salt water. The discomfort should disappear within 24 hours.  If you had a scopolamine patch placed behind your ear for the management of post- operative nausea and/or vomiting:  1. The medication in the patch is effective for 72 hours, after which it should be removed.  Wrap patch in a tissue and discard in the trash. Wash hands thoroughly with soap and water. 2. You may remove the patch earlier than 72 hours if you experience unpleasant side effects which may include dry mouth, dizziness or visual disturbances. 3. Avoid touching the patch. Wash your hands with soap and water after contact with the patch.

## 2014-11-09 NOTE — Transfer of Care (Signed)
Immediate Anesthesia Transfer of Care Note  Patient: [name]  Procedure(s) Performed: Procedure(s): CYSTOSCOPY/HYDRODISTENSION/INSTILLATION OF MARCAINE AND PYRIDUM (N/A)  Patient Location: PACU  Anesthesia Type:General  Level of Consciousness: awake, alert , oriented and patient cooperative  Airway & Oxygen Therapy: Patient Spontanous Breathing and Patient connected to nasal cannula oxygen  Post-op Assessment: Report given to RN and Post -op Vital signs reviewed and stable  Post vital signs: Reviewed and stable  Last Vitals:  Filed Vitals:   11/09/14 0620  BP: 108/74  Pulse: 70  Temp: 37.2 C  Resp: 16    Complications: No apparent anesthesia complications

## 2014-11-09 NOTE — Anesthesia Postprocedure Evaluation (Signed)
  Anesthesia Post-op Note  Patient: Crystal CritchleyAnissa Nelson  Procedure(s) Performed: Procedure(s) (LRB): CYSTOSCOPY/HYDRODISTENSION/INSTILLATION OF MARCAINE AND PYRIDUM (N/A)  Patient Location: PACU  Anesthesia Type: General  Level of Consciousness: awake and alert   Airway and Oxygen Therapy: Patient Spontanous Breathing  Post-op Pain: mild  Post-op Assessment: Post-op Vital signs reviewed, Patient's Cardiovascular Status Stable, Respiratory Function Stable, Patent Airway and No signs of Nausea or vomiting  Last Vitals:  Filed Vitals:   11/09/14 0900  BP: 112/68  Pulse: 65  Temp:   Resp: 14    Post-op Vital Signs: stable   Complications: No apparent anesthesia complications

## 2014-11-10 ENCOUNTER — Encounter (HOSPITAL_BASED_OUTPATIENT_CLINIC_OR_DEPARTMENT_OTHER): Payer: Self-pay | Admitting: Urology

## 2014-11-14 ENCOUNTER — Other Ambulatory Visit: Payer: Self-pay | Admitting: Family

## 2014-11-14 ENCOUNTER — Telehealth: Payer: Self-pay | Admitting: Family

## 2014-11-14 DIAGNOSIS — J0120 Acute ethmoidal sinusitis, unspecified: Secondary | ICD-10-CM

## 2014-11-14 MED ORDER — AMOXICILLIN-POT CLAVULANATE 875-125 MG PO TABS: 1.0000 | ORAL_TABLET | Freq: Two times a day (BID) | ORAL | Status: DC

## 2014-11-14 MED ORDER — ALBUTEROL SULFATE HFA 108 (90 BASE) MCG/ACT IN AERS: 2.0000 | INHALATION_SPRAY | RESPIRATORY_TRACT | Status: DC | PRN

## 2014-11-14 NOTE — Progress Notes (Signed)

## 2014-11-19 ENCOUNTER — Encounter: Payer: Self-pay | Admitting: Family

## 2014-11-19 ENCOUNTER — Ambulatory Visit (INDEPENDENT_AMBULATORY_CARE_PROVIDER_SITE_OTHER): Payer: Medicare Other | Admitting: Family

## 2014-11-19 VITALS — BP 112/74 | HR 78 | Temp 98.2°F | Resp 18 | Ht 66.0 in | Wt 219.8 lb

## 2014-11-19 DIAGNOSIS — R102 Pelvic and perineal pain: Secondary | ICD-10-CM | POA: Diagnosis not present

## 2014-11-19 DIAGNOSIS — J329 Chronic sinusitis, unspecified: Secondary | ICD-10-CM | POA: Insufficient documentation

## 2014-11-19 DIAGNOSIS — J0100 Acute maxillary sinusitis, unspecified: Secondary | ICD-10-CM

## 2014-11-19 MED ORDER — FLUCONAZOLE 150 MG PO TABS: 150.0000 mg | ORAL_TABLET | Freq: Once | ORAL | Status: DC

## 2014-11-19 NOTE — Assessment & Plan Note (Addendum)
Has been seen by urology and informed that the sling is most likely not the problem. Has been seen by gynecology and informed that the pain is not associated with any female organs. Discussed further treatment options in terms of seeking additional urogynecology appointments with a larger [facility] for another opinion. Patient has been referred to pain management by urology. Continue current medications as prescribed. She does experience some depression secondary to the pain and decreased functionality of her former lifestyle. Follow-up pending patient research and/or choice for urogynecology.  Greater than 30 minutes was spent with the patient and her mother in counseling and discussion regarding further management of her pain, sources of her pain and seeking referral to urogynecology.

## 2014-11-19 NOTE — Progress Notes (Signed)
Subjective:    Patient ID: [name], female    DOB: 08/09/1970, 44 y.o.   MRN: [id number]  Chief Complaint  Patient presents with  . Follow-up    having pain every day with the transvaginal sling that was put in, she feels like she is "fading away" feels like she has knifes sticking in her vagina all the time  . Bronchitis    HPI:  Crystal Critchleynissa Somers is a 44 y.o. female with a PMH of type 2 diabetes, anxiety, depression, pelvic pain, and tobacco use who presents today for a follow-up office visit.  1) Pelvic pain - Indicates that she continues to experience pain in the area of the transvaginal sling. She followed up with urology and had a urinary study completed which did not show anything. She has another follow up with urology. Indicates the current pain which continued to be described as "knives" has effected her lifestyle including an inability to exercise and feels like she is "fading away." Not able to do any activities of daily living and has had a decreased appetite as well. GYN has ruled out anything related to female organs.   2) Bronchitis - Associated symptoms of congestion, pain around nose and face, and stopped up ears has been going on for about a week. She was seen at an e-visit and diagnosed with sinusitis and treated with Augmentin and albuterol. She still has 2 days remaining in her Augmentin and feels some relief but continues to experience some symptoms. Currently using Nasonex and Claritin in addition.     Allergies  Allergen Reactions  . Macrobid [employer][Nitrofurantoin Monohyd Macro] Other (See Comments)    "Drains fluids out and can not walk" requiring hospitalization  . Codeine Nausea And Vomiting  . Doxycycline     "doesn't make me feel good"  . Sulfa Antibiotics Nausea And Vomiting    Current Outpatient Prescriptions on File Prior to Visit  Medication Sig Dispense Refill  . acetaminophen (TYLENOL) 500 MG tablet Take 1,000 mg by mouth every 6 (six) hours as needed  for moderate pain.    Marland Kitchen. albuterol (PROVENTIL HFA;VENTOLIN HFA) 108 (90 BASE) MCG/ACT inhaler Inhale 2 puffs into the lungs every 4 (four) hours as needed for wheezing or shortness of breath. 1 Inhaler 0  . amoxicillin-clavulanate (AUGMENTIN) 875-125 MG per tablet Take 1 tablet by mouth 2 (two) times daily. 14 tablet 0  . clonazePAM (KLONOPIN) 1 MG tablet Take 3 mg by mouth 3 (three) times daily.    Marland Kitchen. escitalopram (LEXAPRO) 10 MG tablet Take 10 mg by mouth 2 (two) times daily.   0  . HYDROcodone-acetaminophen (NORCO) 10-325 MG per tablet Take 1 tablet by mouth every 6 (six) hours as needed.    Marland Kitchen. HYDROcodone-acetaminophen (NORCO) 5-325 MG per tablet Take 1-2 tablets by mouth every 6 (six) hours as needed for moderate pain. 40 tablet 0  . hydrOXYzine (VISTARIL) 50 MG capsule TAKE ONE CAPSULE BY MOUTH TWICE A DAY AS NEEDED 60 capsule 3  . ibuprofen (ADVIL,MOTRIN) 200 MG tablet Take 200-400 mg by mouth every 6 (six) hours as needed for moderate pain.    Marland Kitchen. KOMBIGLYZE XR 10-998 MG TB24 TAKE 1 TABLET EVERY DAY 30 tablet 3  . mometasone (NASONEX) 50 MCG/ACT nasal spray Place 2 sprays into the nose as needed.     Marland Kitchen. oxyCODONE-acetaminophen (PERCOCET/ROXICET) 5-325 MG per tablet Take by mouth every 4 (four) hours as needed for severe pain.    . pantoprazole (PROTONIX) 40 MG tablet  Take 40 mg by mouth daily as needed (heart burn).     . polyethylene glycol (MIRALAX / GLYCOLAX) packet Take 17 g by mouth daily. 14 each 0  . pravastatin (PRAVACHOL) 40 MG tablet TAKE 1 TABLET EVERY DAY (Patient taking differently: TAKE 1 TABLET EVERY DAY--  takes in pm) 90 tablet 0  . ranitidine (ZANTAC) 150 MG tablet Take 150-300 mg by mouth 2 (two) times a week.     . Vitamin D, Cholecalciferol, 400 UNITS CAPS Take 400 Units by mouth daily.     No current facility-administered medications on file prior to visit.    Review of Systems  Constitutional: Negative for fever and chills.  HENT: Positive for congestion. Negative for  sinus pressure and sore throat.   Genitourinary: Positive for vaginal pain and pelvic pain.      Objective:    BP 112/74 mmHg  Pulse 78  Temp(Src) 98.2 F (36.8 C) (Oral)  Resp 18  Ht  (1.676 m)  Wt 219 lb 12.8 oz (99.701 kg)  BMI 35.49 kg/m2  SpO2 96%  LMP 11/12/2010 Nursing note and vital signs reviewed.  Physical Exam  Constitutional: She is oriented to person, place, and time. She appears well-developed and well-nourished. No distress.  HENT:  Right Ear: Hearing, tympanic membrane, external ear and ear canal normal.  Left Ear: Hearing, tympanic membrane, external ear and ear canal normal.  Nose: Right sinus exhibits no maxillary sinus tenderness and no frontal sinus tenderness. Left sinus exhibits no maxillary sinus tenderness and no frontal sinus tenderness.  Mouth/Throat: Uvula is midline, oropharynx is clear and moist and mucous membranes are normal.  Cardiovascular: Normal rate, regular rhythm, normal heart sounds and intact distal pulses.   Pulmonary/Chest: Effort normal and breath sounds normal.  Neurological: She is alert and oriented to person, place, and time.  Skin: Skin is warm and dry.  Psychiatric: She has a normal mood and affect. Her behavior is normal. Judgment and thought content normal.       Assessment & Plan:   Problem List Items Addressed This Visit      Respiratory   Sinusitis    Symptoms and exam are consistent with resolving sinusitis. Continue previously prescribed Augmentin. Start Diflucan as needed for candidiasis. Continue over-the-counter medications as needed for symptom relief and supportive care.      Relevant Medications   fluconazole (DIFLUCAN) 150 MG tablet     Other   Pelvic pain in female - Primary    Has been seen by urology and informed that the sling is most likely not the problem. Has been seen by gynecology and informed that the pain is not associated with any female organs. Discussed further treatment options in terms  of seeking additional urogynecology appointments with a larger Medical Center for another opinion. Patient has been referred to pain management by urology. Continue current medications as prescribed. She does experience some depression secondary to the pain and decreased functionality of her former lifestyle. Follow-up pending patient research and/or choice for urogynecology.  Greater than 30 minutes was spent with the patient and her mother in counseling and discussion regarding further management of her pain, sources of her pain and seeking referral to urogynecology.

## 2014-11-19 NOTE — Progress Notes (Signed)
Pre visit review using our clinic review tool, if applicable. No additional management support is needed unless otherwise documented below in the visit note. 

## 2014-11-19 NOTE — Assessment & Plan Note (Signed)
Symptoms and exam are consistent with resolving sinusitis. Continue previously prescribed Augmentin. Start Diflucan as needed for candidiasis. Continue over-the-counter medications as needed for symptom relief and supportive care.

## 2014-11-19 NOTE — Patient Instructions (Addendum)
Thank you for choosing [facility].  Summary/Instructions:  Continue to research a Warehouse managerurogynecologist through either urology or through [employer] search.  Continue to complete the antibiotics and follow up if no improvements.   Your prescription(s) have been submitted to your pharmacy or been printed and provided for you. Please take as directed and contact our office if you believe you are having problem(s) with the medication(s) or have any questions.  If your symptoms worsen or fail to improve, please contact our office for further instruction, or in case of emergency go directly to the emergency room at the closest medical facility.

## 2014-11-26 ENCOUNTER — Other Ambulatory Visit: Payer: Self-pay | Admitting: Physician Assistant

## 2014-11-26 DIAGNOSIS — M25559 Pain in unspecified hip: Secondary | ICD-10-CM

## 2014-11-27 ENCOUNTER — Ambulatory Visit
Admission: RE | Admit: 2014-11-27 | Discharge: 2014-11-27 | Disposition: A | Discharge status: Home / Self Care | Payer: Medicare Other | Source: Ambulatory Visit | Attending: Physician Assistant | Admitting: Physician Assistant

## 2014-11-27 DIAGNOSIS — M25559 Pain in unspecified hip: Secondary | ICD-10-CM

## 2014-12-21 ENCOUNTER — Other Ambulatory Visit: Payer: Self-pay

## 2015-01-08 DIAGNOSIS — Z960 Presence of urogenital implants: Secondary | ICD-10-CM | POA: Insufficient documentation

## 2015-01-11 ENCOUNTER — Telehealth: Payer: Self-pay | Admitting: Family

## 2015-01-11 NOTE — Telephone Encounter (Signed)
Patient just had surgery to get her bladder swing out and her blood count is down to 9 as well as a couple other counts are down. Her urologist told her to discuss with PCP. She is unable to leave the house and was hoping to get some medication called in. Please call her at [phone]

## 2015-01-11 NOTE — Telephone Encounter (Signed)
Spoke with patient regarding the numbers from her surgeon. Hemoglobin was 9.8 and HCT 30.3. She continues to experience postsurgical bleeding and goes through about 1 pad per day. She will schedule a time to follow up in the office. Per phone call patient appears stable at this time.

## 2015-01-12 ENCOUNTER — Other Ambulatory Visit (INDEPENDENT_AMBULATORY_CARE_PROVIDER_SITE_OTHER): Payer: Medicare Other

## 2015-01-12 ENCOUNTER — Encounter: Payer: Self-pay | Admitting: Family

## 2015-01-12 ENCOUNTER — Ambulatory Visit (INDEPENDENT_AMBULATORY_CARE_PROVIDER_SITE_OTHER): Payer: Medicare Other | Admitting: Family

## 2015-01-12 VITALS — BP 94/64 | HR 81 | Temp 98.6°F | Resp 18 | Ht 66.0 in | Wt 216.0 lb

## 2015-01-12 DIAGNOSIS — D649 Anemia, unspecified: Secondary | ICD-10-CM

## 2015-01-12 LAB — BASIC METABOLIC PANEL
BUN: 12 mg/dL (ref 6–23)
CO2: 31 mEq/L (ref 19–32)
Calcium: 9.2 mg/dL (ref 8.4–10.5)
Chloride: 99 mEq/L (ref 96–112)
Creatinine, Ser: 0.55 mg/dL (ref 0.40–1.20)
GFR: 127.06 mL/min (ref >60.00)
GLUCOSE: 113 mg/dL — AB (ref 70–99)
Potassium: 4.1 mEq/L (ref 3.5–5.1)
Sodium: 137 mEq/L (ref 135–145)

## 2015-01-12 LAB — CBC
HCT: 32.1 % — ABNORMAL LOW (ref 36.0–46.0)
Hemoglobin: 10.8 g/dL — ABNORMAL LOW (ref 12.0–15.0)
MCHC: 33.7 g/dL (ref 30.0–36.0)
MCV: 89.7 fl (ref 78.0–100.0)
PLATELETS: 236 10*3/uL (ref 150.0–400.0)
RBC: 3.58 Mil/uL — ABNORMAL LOW (ref 3.87–5.11)
RDW: 13.7 % (ref 11.5–15.5)
WBC: 8.6 10*3/uL (ref 4.0–10.5)

## 2015-01-12 NOTE — Patient Instructions (Addendum)
Thank you for choosing [facility].  Summary/Instructions:  Please stop by the lab on the basement level of the building for your blood work. Your results will be released to MyChart (or called to you) after review, usually within 72 hours after test completion. If any changes need to be made, you will be notified at that same time.  If your symptoms worsen or fail to improve, please contact our office for further instruction, or in case of emergency go directly to the emergency room at the closest medical facility.   Please continue to watch for bleeding.

## 2015-01-12 NOTE — Progress Notes (Signed)
Pre visit review using our clinic review tool, if applicable. No additional management support is needed unless otherwise documented below in the visit note. 

## 2015-01-12 NOTE — Progress Notes (Signed)
Subjective:    Patient ID: Crystal CritchleyAnissa Nelson, female    DOB: 02/27/1971, 44 y.o.   MRN: [id number]  Chief Complaint  Patient presents with  . Hospitalization Follow-up    says that she still is bleeding, says her numbers have been low    HPI:  [name] is a 44 y.o. female with a PMH of potential diabetes, hypercholesterolemia, anxiety, depression, and panic attacks who presents today for office follow up after surgery.   Recently seen at [facility] to have her bladder sling removed as she believes this was the root cause of her pain. Follow up blood work revealed a hemoglobin of 9.8 and a hematocrit of 30.3.  She was instructed to follow up with primary care regarding these numbers.   Describes that currently she is experiecing the associated symptoms of no energy and feels like she is bruised all over. Indicates that she still continues to bleed following the surgery going through about 1 pad per day. Continues to experience some dizziness and pain is currently controlled with percocet. She does still require some assistance performing activities of daily living.    Allergies  Allergen Reactions  . Macrobid [employer][Nitrofurantoin Monohyd Macro] Other (See Comments)    "Drains fluids out and can not walk" requiring hospitalization  . Codeine Nausea And Vomiting  . Doxycycline     "doesn't make me feel good"  . Sulfa Antibiotics Nausea And Vomiting    Current Outpatient Prescriptions on File Prior to Visit  Medication Sig Dispense Refill  . acetaminophen (TYLENOL) 500 MG tablet Take 1,000 mg by mouth every 6 (six) hours as needed for moderate pain.    Marland Kitchen. albuterol (PROVENTIL HFA;VENTOLIN HFA) 108 (90 BASE) MCG/ACT inhaler Inhale 2 puffs into the lungs every 4 (four) hours as needed for wheezing or shortness of breath. 1 Inhaler 0  . clonazePAM (KLONOPIN) 1 MG tablet Take 3 mg by mouth 3 (three) times daily.    Marland Kitchen. escitalopram (LEXAPRO) 10 MG tablet Take 10 mg by mouth 2 (two) times  daily.   0  . fluconazole (DIFLUCAN) 150 MG tablet Take 1 tablet (150 mg total) by mouth once. 1 tablet 2  . hydrOXYzine (VISTARIL) 50 MG capsule TAKE ONE CAPSULE BY MOUTH TWICE A DAY AS NEEDED 60 capsule 3  . ibuprofen (ADVIL,MOTRIN) 200 MG tablet Take 200-400 mg by mouth every 6 (six) hours as needed for moderate pain.    Marland Kitchen. KOMBIGLYZE XR 10-998 MG TB24 TAKE 1 TABLET EVERY DAY 30 tablet 3  . mometasone (NASONEX) 50 MCG/ACT nasal spray Place 2 sprays into the nose as needed.     Marland Kitchen. oxyCODONE-acetaminophen (PERCOCET/ROXICET) 5-325 MG per tablet Take by mouth every 4 (four) hours as needed for severe pain.    . pantoprazole (PROTONIX) 40 MG tablet Take 40 mg by mouth daily as needed (heart burn).     . polyethylene glycol (MIRALAX / GLYCOLAX) packet Take 17 g by mouth daily. 14 each 0  . pravastatin (PRAVACHOL) 40 MG tablet TAKE 1 TABLET EVERY DAY (Patient taking differently: TAKE 1 TABLET EVERY DAY--  takes in pm) 90 tablet 0  . ranitidine (ZANTAC) 150 MG tablet Take 150-300 mg by mouth 2 (two) times a week.     . Vitamin D, Cholecalciferol, 400 UNITS CAPS Take 400 Units by mouth daily.     No current facility-administered medications on file prior to visit.    Review of Systems  Constitutional: Positive for fatigue.  Neurological: Positive for dizziness  and weakness. Negative for light-headedness.      Objective:    BP 94/64 mmHg  Pulse 81  Temp(Src) 98.6 F (37 C) (Oral)  Resp 18  Ht  (1.676 m)  Wt 216 lb (97.977 kg)  BMI 34.88 kg/m2  SpO2 97%  LMP 11/12/2010 Nursing note and vital signs reviewed.  Physical Exam  Constitutional: She is oriented to person, place, and time. She appears well-developed and well-nourished. No distress.  Cardiovascular: Normal rate, regular rhythm, normal heart sounds and intact distal pulses.   Pulmonary/Chest: Effort normal and breath sounds normal.  Neurological: She is alert and oriented to person, place, and time.  Skin: Skin is warm and  dry.  Psychiatric: She has a normal mood and affect. Her behavior is normal. Judgment and thought content normal.       Assessment & Plan:   Problem List Items Addressed This Visit      Other   Low hemoglobin - Primary    Notes continued mild bleeding following surgery going through approximately 1 pad per day. Previous hemoglobin was noted to be 9.8. Obtain CBC and basic metabolic panel. No obvious source of significant bleeding noted. Continue follow-up with surgery in 3 weeks as scheduled or sooner if needed.      Relevant Orders   CBC   Basic Metabolic Panel (BMET)

## 2015-01-12 NOTE — Assessment & Plan Note (Signed)
Notes continued mild bleeding following surgery going through approximately 1 pad per day. Previous hemoglobin was noted to be 9.8. Obtain CBC and basic metabolic panel. No obvious source of significant bleeding noted. Continue follow-up with surgery in 3 weeks as scheduled or sooner if needed.

## 2015-01-15 ENCOUNTER — Encounter: Payer: Self-pay | Admitting: Family

## 2015-01-20 ENCOUNTER — Other Ambulatory Visit: Payer: Self-pay | Admitting: Family

## 2015-02-13 ENCOUNTER — Ambulatory Visit (INDEPENDENT_AMBULATORY_CARE_PROVIDER_SITE_OTHER): Payer: Medicare Other | Admitting: Family Medicine

## 2015-02-13 ENCOUNTER — Encounter: Payer: Self-pay | Admitting: Family Medicine

## 2015-02-13 VITALS — BP 108/64 | HR 75 | Temp 98.3°F | Ht 66.0 in | Wt 213.2 lb

## 2015-02-13 DIAGNOSIS — R300 Dysuria: Secondary | ICD-10-CM

## 2015-02-13 LAB — POCT URINALYSIS DIPSTICK
Bilirubin, UA: NEGATIVE
Blood, UA: NEGATIVE
Glucose, UA: NEGATIVE
KETONES UA: NEGATIVE
Leukocytes, UA: NEGATIVE
Nitrite, UA: NEGATIVE
PROTEIN UA: NEGATIVE
Spec Grav, UA: 1.01
Urobilinogen, UA: 0.2
pH, UA: 7

## 2015-02-13 MED ORDER — PHENAZOPYRIDINE HCL 200 MG PO TABS: 200.0000 mg | ORAL_TABLET | Freq: Three times a day (TID) | ORAL | Status: DC | PRN

## 2015-02-13 NOTE — Progress Notes (Signed)
Pre visit review using our clinic review tool, if applicable. No additional management support is needed unless otherwise documented below in the visit note. 

## 2015-02-13 NOTE — Progress Notes (Signed)
   Subjective:    Patient ID: [name], female    DOB: Feb 11, 1971, 44 y.o.   MRN: [id number]  HPI Her asking advice about lower pelvic pressure and mild urethral discomfort. She has a hx of frequent UTIs and she has had a bladder sling procedure. She sees Dr. Logan Bores for this, and in fact 6 weeks ago she had a procedure at [facility] to take down mesh from the original sling surgery. Then 3 weeks ago she was found to have a Pseudomonas UTI and was treated with IV and oral antibiotics including Gentamycin. She usually has a small amount of bacteriuria and Dr. Logan Bores has considered the possibility that she may have interstitial cystitis. Now for the past few says she has the above sx. No fever and she drinks plenty of water.    Review of Systems  Constitutional: Negative.   Respiratory: Negative.   Cardiovascular: Negative.   Gastrointestinal: Negative.   Genitourinary: Positive for dysuria, urgency and frequency. Negative for hematuria, flank pain and pelvic pain.       Objective:   Physical Exam  Constitutional: She is oriented to person, place, and time. She appears well-developed and well-nourished.  Cardiovascular: Normal rate, normal heart sounds and intact distal pulses.   Pulmonary/Chest: Effort normal and breath sounds normal.  Abdominal: Soft. Bowel sounds are normal. She exhibits no distension and no mass. There is no tenderness. There is no rebound and no guarding.  Neurological: She is alert and oriented to person, place, and time.          Assessment & Plan:  It seems she may have interstitial cystitis and I do not think she has a UTI today. Suggested she try Pyridium prn and follow up with Dr. Logan Bores.

## 2015-02-14 ENCOUNTER — Encounter: Payer: Self-pay | Admitting: Family Medicine

## 2015-02-15 ENCOUNTER — Telehealth: Payer: Self-pay | Admitting: Family

## 2015-02-15 NOTE — Telephone Encounter (Signed)
Pt has BC[employer] Medicare and Medicare will not cover Pyridium  tablets.

## 2015-02-15 NOTE — Telephone Encounter (Signed)
This is not our patient.

## 2015-02-16 ENCOUNTER — Telehealth: Payer: Self-pay | Admitting: *Deleted

## 2015-02-16 NOTE — Telephone Encounter (Signed)
Dr Clent Ridges Rx'd this medication for her.

## 2015-02-16 NOTE — Telephone Encounter (Signed)
[facility] - Client TELEPHONE ADVICE RECORD [facility] Patient Name: [name] Gender: Female DOB: 08-01-1970 Age: 44 Y 18 D Return Phone Number: [phone] (Primary) Address: City/State/Zip: [place] Client [facility] - Client Client Site [facility] Day Physician Marcos Eke Contact Type Call Call Type Triage / Clinical Relationship To Patient Self Appointment Disposition EMR Appointment Attempted - Not Scheduled Info pasted into Epic No Return Phone Number [phone] (Primary) Chief Complaint Urination Pain Initial Comment Caller states she had surgery 6 weeks ago. She thinks she has a UTI. She has pressure in her lower back and states her urethra hurts. PreDisposition Call Doctor Nurse Assessment Nurse: Orvis Brill, RN, Olegario Messier Date/Time Lamount Cohen Time): 02/13/2015 12:02:06 PM Confirm and document reason for call. If symptomatic, describe symptoms. ---Caller states that she had surgery 6 wks ago to have mesh removed from her bladder sling, reports that she finished course of 10 injections of Gentamycin approx 2 wks ago for Pseudomonas. Was also seen in ED last Saturday for UTI symptoms & was treated with IV antibiotics but no oral antibiotics were prescribed. Caller states that she was seen in office last wk & her urine was clear. Caller states that she had onset of low back pain & urethral pain yesterday. Has the patient traveled out of the country within the last 30 days? ---No Does the patient require triage? ---Yes Related visit to physician within the last 2 weeks? ---Yes Does the PT have any chronic conditions? (i.e. diabetes, asthma, etc.) ---Yes List chronic conditions. ---Type 2 DM, high cholesterol, panic/anxiety attacks, depression Did the patient indicate they were pregnant? ---No Guidelines Guideline Title Affirmed Question Affirmed Notes Nurse Date/Time Lamount Cohen Time) Urination Pain -  Female Side (flank) or lower back pain present Pearline Cables 02/13/2015 12:06:04 PM Disp. Time Lamount Cohen Time) Disposition Final User 02/13/2015 12:08:47 PM See Physician within 4 Hours (or PCP triage) Yes Orvis Brill, RN, Olegario Messier PLEASE NOTE: All timestamps contained within this report are represented as [place] Standard Time. CONFIDENTIALTY NOTICE: This fax transmission is intended only for the addressee. It contains information that is legally privileged, confidential or otherwise protected from use or disclosure. If you are not the intended recipient, you are strictly prohibited from reviewing, disclosing, copying using or disseminating any of this information or taking any action in reliance on or regarding this information. If you have received this fax in error, please notify us immediately by telephone so that we can arrange for its return to [place]. Phone: [phone], Toll-Free: [phone], Fax: [phone] Page: 2 of 2 Call Id: [id number] Caller Understands: Yes Disagree/Comply: Comply Care Advice Given Per Guideline * IF OFFICE WILL BE CLOSED AND NO PCP TRIAGE: You need to be seen within the next 3 or 4 hours. A nearby Urgent Care Center is often a good source of care. Another choice is to go to the ER. Go sooner if you become worse. PAIN MEDICINES: * For pain relief, take acetaminophen, ibuprofen, or naproxen. * Use the lowest amount that makes your pain feel better. ACETAMINOPHEN (E.G., TYLENOL): * Take 650 mg (two 325 mg pills) by mouth every 4-6 hours as needed. Each Regular Strength Tylenol pill has 325 mg of acetaminophen. The most you should take each day is 3,250 mg (10 Regular Strength pills a day). * Another choice is to take 1,000 mg (two 500 mg pills) every 8 hours as needed. Each Extra Strength Tylenol pill has 500 mg of acetaminophen. The  most you should take each day is 3,000 mg (6 Extra Strength pills a day). EXTRA NOTES: * Before taking any medicine, read all the  instructions on the package. CALL BACK IF: * You become worse. CARE ADVICE given per Urination Pain - Female (Adult) guideline. After Care Instructions Given Call Event Type User Date / Time Description Comments User: Almira Bar, RN Date/Time Lamount Cohen Time): 02/13/2015 12:18:12 PM D/W charge nurse Deb who advised tirager that Call Center does not schedule appts for [facility] on w/e's & that if caller wants to be seen there that caller can call [phone] for possible appt but clinic closes at 1300. Deb also advised that triager does not need to import record in EPIC as call was created under incorrect site, should have been [facility] but Deb advised triager that triager does not need to create new record under correct site. User: Almira Bar, RN Date/Time Lamount Cohen Time): 02/13/2015 12:21:16 PM Called caller back & advised that triager cannot make appts for [facility] on Saturdays as per charge nurse Deb but provided caller with number for her to call herself to see if she can get an appt before clinic closes at 1300 today, [phone]. Caller states that she does not know where she will be going for evaluation if she cannot get into clinic to be seen. Referrals GO TO FACILITY UNDECIDED

## 2015-02-16 NOTE — Telephone Encounter (Signed)
I spoke with pt and she did not pick up script due to cost. Per Dr. Clent Ridges pt should contact her primary care doctor for further advice.

## 2015-02-24 ENCOUNTER — Encounter: Payer: Self-pay | Admitting: Family

## 2015-02-24 ENCOUNTER — Other Ambulatory Visit (INDEPENDENT_AMBULATORY_CARE_PROVIDER_SITE_OTHER): Payer: Medicare Other

## 2015-02-24 ENCOUNTER — Ambulatory Visit (INDEPENDENT_AMBULATORY_CARE_PROVIDER_SITE_OTHER): Payer: Medicare Other | Admitting: Family

## 2015-02-24 VITALS — BP 102/74 | HR 75 | Temp 98.1°F | Resp 18 | Ht 66.0 in | Wt 214.0 lb

## 2015-02-24 DIAGNOSIS — Z0000 Encounter for general adult medical examination without abnormal findings: Secondary | ICD-10-CM

## 2015-02-24 DIAGNOSIS — Z23 Encounter for immunization: Secondary | ICD-10-CM | POA: Diagnosis not present

## 2015-02-24 LAB — COMPREHENSIVE METABOLIC PANEL
ALBUMIN: 4.2 g/dL (ref 3.5–5.2)
ALK PHOS: 59 U/L (ref 39–117)
ALT: 10 U/L (ref 0–35)
AST: 14 U/L (ref 0–37)
BUN: 12 mg/dL (ref 6–23)
CO2: 31 mEq/L (ref 19–32)
Calcium: 9.4 mg/dL (ref 8.4–10.5)
Chloride: 102 mEq/L (ref 96–112)
Creatinine, Ser: 0.64 mg/dL (ref 0.40–1.20)
GFR: 106.62 mL/min (ref >60.00)
Glucose, Bld: 112 mg/dL — ABNORMAL HIGH (ref 70–99)
POTASSIUM: 4.5 meq/L (ref 3.5–5.1)
Sodium: 138 mEq/L (ref 135–145)
Total Bilirubin: 0.5 mg/dL (ref 0.2–1.2)
Total Protein: 7.5 g/dL (ref 6.0–8.3)

## 2015-02-24 LAB — URINALYSIS, ROUTINE W REFLEX MICROSCOPIC
BILIRUBIN URINE: NEGATIVE
Hgb urine dipstick: NEGATIVE
Ketones, ur: NEGATIVE
Nitrite: NEGATIVE
RBC / HPF: NONE SEEN (ref 0–?)
Specific Gravity, Urine: 1.015 (ref 1.000–1.030)
Total Protein, Urine: NEGATIVE
Urine Glucose: NEGATIVE
Urobilinogen, UA: 0.2 (ref 0.0–1.0)
pH: 8 (ref 5.0–8.0)

## 2015-02-24 LAB — CBC
HCT: 41.4 % (ref 36.0–46.0)
HEMOGLOBIN: 13.8 g/dL (ref 12.0–15.0)
MCHC: 33.3 g/dL (ref 30.0–36.0)
MCV: 89 fl (ref 78.0–100.0)
PLATELETS: 231 10*3/uL (ref 150.0–400.0)
RBC: 4.65 Mil/uL (ref 3.87–5.11)
RDW: 13.9 % (ref 11.5–15.5)
WBC: 8.2 10*3/uL (ref 4.0–10.5)

## 2015-02-24 LAB — LIPID PANEL
CHOLESTEROL: 136 mg/dL (ref 0–200)
HDL: 45.5 mg/dL (ref >39.00)
LDL Cholesterol: 76 mg/dL (ref 0–99)
NonHDL: 90.96
Total CHOL/HDL Ratio: 3
Triglycerides: 75 mg/dL (ref 0.0–149.0)
VLDL: 15 mg/dL (ref 0.0–40.0)

## 2015-02-24 LAB — HEMOGLOBIN A1C: Hgb A1c MFr Bld: 6 % (ref 4.6–6.5)

## 2015-02-24 MED ORDER — SAXAGLIPTIN-METFORMIN ER 5-1000 MG PO TB24: 1.0000 | ORAL_TABLET | Freq: Every day | ORAL | Status: DC

## 2015-02-24 NOTE — Assessment & Plan Note (Signed)
Reviewed and updated patient's medical, surgical, family and social history. Medications and allergies were also reviewed. Basic screenings for depression, activities of daily living, hearing, cognition and safety were performed. Provider list was updated and health plan was provided to the patient.  
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
## 2015-02-24 NOTE — Patient Instructions (Addendum)
Thank you for choosing [facility].  Summary/Instructions:  Health Maintenance  Topic Date Due  . HIV Screening  01/25/1985  . PNEUMOCOCCAL POLYSACCHARIDE VACCINE (2) 09/11/2013  . URINE MICROALBUMIN  03/13/2014  . INFLUENZA VACCINE  01/25/2015  . HEMOGLOBIN A1C  03/30/2015  . FOOT EXAM  07/02/2015  . OPHTHALMOLOGY EXAM  08/27/2015  . TETANUS/TDAP  12/10/2021   Health Maintenance Adopting a healthy lifestyle and getting preventive care can go a long way to promote health and wellness. Talk with your health care provider about what schedule of regular examinations is right for you. This is a good chance for you to check in with your provider about disease prevention and staying healthy. In between checkups, there are plenty of things you can do on your own. Experts have done a lot of research about which lifestyle changes and preventive measures are most likely to keep you healthy. Ask your health care provider for more information. WEIGHT AND DIET  Eat a healthy diet  Be sure to include plenty of vegetables, fruits, low-fat dairy products, and lean protein.  Do not eat a lot of foods high in solid fats, added sugars, or salt.  Get regular exercise. This is one of the most important things you can do for your health.  Most adults should exercise for at least 150 minutes each week. The exercise should increase your heart rate and make you sweat (moderate-intensity exercise).  Most adults should also do strengthening exercises at least twice a week. This is in addition to the moderate-intensity exercise.  Maintain a healthy weight  Body mass index (BMI) is a measurement that can be used to identify possible weight problems. It estimates body fat based on height and weight. Your health care provider can help determine your BMI and help you achieve or maintain a healthy weight.  For females 84 years of age and older:   A BMI below 18.5 is considered underweight.  A BMI of  18.5 to 24.9 is normal.  A BMI of 25 to 29.9 is considered overweight.  A BMI of 30 and above is considered obese.  Watch levels of cholesterol and blood lipids  You should start having your blood tested for lipids and cholesterol at 45 years of age, then have this test every 5 years.  You may need to have your cholesterol levels checked more often if:  Your lipid or cholesterol levels are high.  You are older than 45 years of age.  You are at high risk for heart disease.  CANCER SCREENING   Lung Cancer  Lung cancer screening is recommended for adults 62-45 years old who are at high risk for lung cancer because of a history of smoking.  A yearly low-dose CT scan of the lungs is recommended for people who:  Currently smoke.  Have quit within the past 15 years.  Have at least a 30-pack-year history of smoking. A pack year is smoking an average of one pack of cigarettes a day for 1 year.  Yearly screening should continue until it has been 15 years since you quit.  Yearly screening should stop if you develop a health problem that would prevent you from having lung cancer treatment.  Breast Cancer  Practice breast self-awareness. This means understanding how your breasts normally appear and feel.  It also means doing regular breast self-exams. Let your health care provider know about any changes, no matter how small.  If you are in your 20s or 30s, you should  have a clinical breast exam (CBE) by a health care provider every 1-3 years as part of a regular health exam.  If you are 20 or older, have a CBE every year. Also consider having a breast X-ray (mammogram) every year.  If you have a family history of breast cancer, talk to your health care provider about genetic screening.  If you are at high risk for breast cancer, talk to your health care provider about having an MRI and a mammogram every year.  Breast cancer gene (BRCA) assessment is recommended for women who  have family members with BRCA-related cancers. BRCA-related cancers include:  Breast.  Ovarian.  Tubal.  Peritoneal cancers.  Results of the assessment will determine the need for genetic counseling and BRCA1 and BRCA2 testing. Cervical Cancer Routine pelvic examinations to screen for cervical cancer are no longer recommended for nonpregnant women who are considered low risk for cancer of the pelvic organs (ovaries, uterus, and vagina) and who do not have symptoms. A pelvic examination may be necessary if you have symptoms including those associated with pelvic infections. Ask your health care provider if a screening pelvic exam is right for you.   The Pap test is the screening test for cervical cancer for women who are considered at risk.  If you had a hysterectomy for a problem that was not cancer or a condition that could lead to cancer, then you no longer need Pap tests.  If you are older than 65 years, and you have had normal Pap tests for the past 10 years, you no longer need to have Pap tests.  If you have had past treatment for cervical cancer or a condition that could lead to cancer, you need Pap tests and screening for cancer for at least 20 years after your treatment.  If you no longer get a Pap test, assess your risk factors if they change (such as having a new sexual partner). This can affect whether you should start being screened again.  Some women have medical problems that increase their chance of getting cervical cancer. If this is the case for you, your health care provider may recommend more frequent screening and Pap tests.  The human papillomavirus (HPV) test is another test that may be used for cervical cancer screening. The HPV test looks for the virus that can cause cell changes in the cervix. The cells collected during the Pap test can be tested for HPV.  The HPV test can be used to screen women 11 years of age and older. Getting tested for HPV can extend the  interval between normal Pap tests from three to five years.  An HPV test also should be used to screen women of any age who have unclear Pap test results.  After 45 years of age, women should have HPV testing as often as Pap tests.  Colorectal Cancer  This type of cancer can be detected and often prevented.  Routine colorectal cancer screening usually begins at 45 years of age and continues through 44 years of age.  Your health care provider may recommend screening at an earlier age if you have risk factors for colon cancer.  Your health care provider may also recommend using home test kits to check for hidden blood in the stool.  A small camera at the end of a tube can be used to examine your colon directly (sigmoidoscopy or colonoscopy). This is done to check for the earliest forms of colorectal cancer.  Routine screening usually  begins at age 92.  Direct examination of the colon should be repeated every 5-10 years through 45 years of age. However, you may need to be screened more often if early forms of precancerous polyps or small growths are found. Skin Cancer  Check your skin from head to toe regularly.  Tell your health care provider about any new moles or changes in moles, especially if there is a change in a mole's shape or color.  Also tell your health care provider if you have a mole that is larger than the size of a pencil eraser.  Always use sunscreen. Apply sunscreen liberally and repeatedly throughout the day.  Protect yourself by wearing long sleeves, pants, a wide-brimmed hat, and sunglasses whenever you are outside. HEART DISEASE, DIABETES, AND HIGH BLOOD PRESSURE   Have your blood pressure checked at least every 1-2 years. High blood pressure causes heart disease and increases the risk of stroke.  If you are between 77 years and 63 years old, ask your health care provider if you should take aspirin to prevent strokes.  Have regular diabetes screenings. This  involves taking a blood sample to check your fasting blood sugar level.  If you are at a normal weight and have a low risk for diabetes, have this test once every three years after 45 years of age.  If you are overweight and have a high risk for diabetes, consider being tested at a younger age or more often. PREVENTING INFECTION  Hepatitis B  If you have a higher risk for hepatitis B, you should be screened for this virus. You are considered at high risk for hepatitis B if:  You were born in a country where hepatitis B is common. Ask your health care provider which countries are considered high risk.  Your parents were born in a high-risk country, and you have not been immunized against hepatitis B (hepatitis B vaccine).  You have HIV or AIDS.  You use needles to inject street drugs.  You live with someone who has hepatitis B.  You have had sex with someone who has hepatitis B.  You get hemodialysis treatment.  You take certain medicines for conditions, including cancer, organ transplantation, and autoimmune conditions. Hepatitis C  Blood testing is recommended for:  Everyone born from [birth year] through 1966.  Anyone with known risk factors for hepatitis C. Sexually transmitted infections (STIs)  You should be screened for sexually transmitted infections (STIs) including gonorrhea and chlamydia if:  You are sexually active and are younger than 45 years of age.  You are older than 45 years of age and your health care provider tells you that you are at risk for this type of infection.  Your sexual activity has changed since you were last screened and you are at an increased risk for chlamydia or gonorrhea. Ask your health care provider if you are at risk.  If you do not have HIV, but are at risk, it may be recommended that you take a prescription medicine daily to prevent HIV infection. This is called pre-exposure prophylaxis (PrEP). You are considered at risk if:  You are  sexually active and do not regularly use condoms or know the HIV status of your partner(s).  You take drugs by injection.  You are sexually active with a partner who has HIV. Talk with your health care provider about whether you are at high risk of being infected with HIV. If you choose to begin PrEP, you should first be tested for  HIV. You should then be tested every 3 months for as long as you are taking PrEP.  PREGNANCY   If you are premenopausal and you may become pregnant, ask your health care provider about preconception counseling.  If you may become pregnant, take 400 to 800 micrograms (mcg) of folic acid every day.  If you want to prevent pregnancy, talk to your health care provider about birth control (contraception). OSTEOPOROSIS AND MENOPAUSE   Osteoporosis is a disease in which the bones lose minerals and strength with aging. This can result in serious bone fractures. Your risk for osteoporosis can be identified using a bone density scan.  If you are 15 years of age or older, or if you are at risk for osteoporosis and fractures, ask your health care provider if you should be screened.  Ask your health care provider whether you should take a calcium or vitamin D supplement to lower your risk for osteoporosis.  Menopause may have certain physical symptoms and risks.  Hormone replacement therapy may reduce some of these symptoms and risks. Talk to your health care provider about whether hormone replacement therapy is right for you.  HOME CARE INSTRUCTIONS   Schedule regular health, dental, and eye exams.  Stay current with your immunizations.   Do not use any tobacco products including cigarettes, chewing tobacco, or electronic cigarettes.  If you are pregnant, do not drink alcohol.  If you are breastfeeding, limit how much and how often you drink alcohol.  Limit alcohol intake to no more than 1 drink per day for nonpregnant women. One drink equals 12 ounces of beer, 5  ounces of wine, or 1 ounces of hard liquor.  Do not use street drugs.  Do not share needles.  Ask your health care provider for help if you need support or information about quitting drugs.  Tell your health care provider if you often feel depressed.  Tell your health care provider if you have ever been abused or do not feel safe at home. Document Released: 12/26/2010 Document Revised: 10/27/2013 Document Reviewed: 05/14/2013 [facility] Patient Information 2015 [place], [place]. This information is not intended to replace advice given to you by your health care provider. Make sure you discuss any questions you have with your health care provider.

## 2015-02-24 NOTE — Progress Notes (Signed)
Pre visit review using our clinic review tool, if applicable. No additional management support is needed unless otherwise documented below in the visit note. 

## 2015-02-24 NOTE — Assessment & Plan Note (Signed)
1) Anticipatory Guidance: Discussed importance of wearing a seatbelt while driving and not texting while driving; changing batteries in smoke detector at least once annually; wearing suntan lotion when outside; eating a balanced and moderate diet; getting physical activity at least 30 minutes per day.  2) Immunizations / Screenings / Labs:  Influenza updated today. All other immunizations are up to date per recommendations. Due for a dental visit which will be scheduled independently. Urine microalbumin will be evaluated with UA. All other screenings are up to date per recommendations. Obtain CBC, CMET, Lipid profile, Vitamin D, A1c and TSH.   Overall adequate exam. Several risk factors for cardiovascular disease as noted. Hypertension and diabetes are currently controlled with medication regimen and denies adverse side effects. Discussed tobacco cessation and patient is not ready to quit at this time. She continues to recover from her mesh sling removal and is making progress. Managed by urology. Emphasized the importance of nutrient dense diet that is low in saturated fats. Also goal would be to increase exercise as tolerated and able through urology and her pain levels. Follow up prevention exam in 1 year. Follow up office visit pending lab work.

## 2015-02-25 ENCOUNTER — Telehealth: Payer: Self-pay | Admitting: Family

## 2015-02-25 ENCOUNTER — Encounter: Payer: Self-pay | Admitting: Family

## 2015-02-25 NOTE — Telephone Encounter (Signed)
Lab results sent via MyChart.

## 2015-02-25 NOTE — Telephone Encounter (Signed)
Pt called needing lab results

## 2015-02-26 LAB — VITAMIN D 1,25 DIHYDROXY
Vitamin D 1, 25 (OH)2 Total: 44 pg/mL (ref 18–72)
Vitamin D3 1, 25 (OH)2: 44 pg/mL

## 2015-03-02 ENCOUNTER — Encounter: Payer: Self-pay | Admitting: Family

## 2015-03-09 ENCOUNTER — Other Ambulatory Visit: Payer: Self-pay | Admitting: Family

## 2015-03-09 ENCOUNTER — Other Ambulatory Visit: Payer: Self-pay | Admitting: General Practice

## 2015-03-23 ENCOUNTER — Other Ambulatory Visit: Payer: Self-pay | Admitting: Pain Medicine

## 2015-03-23 DIAGNOSIS — M545 Low back pain: Secondary | ICD-10-CM

## 2015-03-24 ENCOUNTER — Other Ambulatory Visit: Payer: Medicare Other

## 2015-04-05 ENCOUNTER — Telehealth: Payer: Self-pay | Admitting: Family

## 2015-04-05 NOTE — Telephone Encounter (Signed)
PLEASE NOTE: All timestamps contained within this report are represented as [place] Standard Time. CONFIDENTIALTY NOTICE: This fax transmission is intended only for the addressee. It contains information that is legally privileged, confidential or otherwise protected from use or disclosure. If you are not the intended recipient, you are strictly prohibited from reviewing, disclosing, copying using or disseminating any of this information or taking any action in reliance on or regarding this information. If you have received this fax in error, please notify us immediately by telephone so that we can arrange for its return to [place]. Phone: [phone], Toll-Free: [phone], Fax: [phone] Page: 1 of 1 Call Id: [id number]  [facility] - Client TELEPHONE ADVICE RECORD [facility] Patient Name: [name] DOB: May 06, 1971 Initial Comment Caller states, she had 10 injections in her arms on the 1st of Sept, she wants to know how long it will take to heal the bruises. Her arms hurt, she can not lay on them due top the pain Nurse Assessment Nurse: Izola Price, RN, Cala Bradford Date/Time Lamount Cohen Time): 04/05/2015 1:34:15 PM Confirm and document reason for call. If symptomatic, describe symptoms. ---Caller states, she had 10 injections in her arms on the 1st of Sept, she wants to know how long it will take to heal. Her arms hurt, she can not lay on them due top the pain. Gentamycin was medication. had to go twice a day for 5 days for injections. Had multiple bacteria in bladder. both arms have lots of pain. Up all night with pain and applied icy hot. this helped a little bit. unable to lift arms up over the head. Takes oxycodone, clonopine, kombiglyze, lexapro, zofran, prevastatin, baclafen and valium suppositories Has the patient traveled out of the country within the last 30 days? ---Not Applicable Does the patient have any new or worsening symptoms? ---Yes Will a  triage be completed? ---Yes Related visit to physician within the last 2 weeks? ---No Does the PT have any chronic conditions? (i.e. diabetes, asthma, etc.) ---Yes List chronic conditions. ---diabetic, panic attacks, anxiety and depression Did the patient indicate they were pregnant? ---No Guidelines Guideline Title Affirmed Question Affirmed Notes Shoulder Injury Can't move injured shoulder normally (e.g., full range of motion, able to touch top of head) Final Disposition User See Physician within 24 Hours Izola Price, RN, [employer] Referrals REFERRED TO PCP OFFICE Disagree/Comply: Danella Maiers

## 2015-04-06 ENCOUNTER — Ambulatory Visit (INDEPENDENT_AMBULATORY_CARE_PROVIDER_SITE_OTHER): Payer: Medicare Other | Admitting: Internal Medicine

## 2015-04-06 ENCOUNTER — Other Ambulatory Visit (INDEPENDENT_AMBULATORY_CARE_PROVIDER_SITE_OTHER): Payer: Medicare Other

## 2015-04-06 ENCOUNTER — Encounter: Payer: Self-pay | Admitting: Internal Medicine

## 2015-04-06 VITALS — BP 90/68 | HR 83 | Temp 99.1°F | Wt 218.0 lb

## 2015-04-06 DIAGNOSIS — L981 Factitial dermatitis: Secondary | ICD-10-CM

## 2015-04-06 DIAGNOSIS — T148 Other injury of unspecified body region: Secondary | ICD-10-CM

## 2015-04-06 DIAGNOSIS — F429 Obsessive-compulsive disorder, unspecified: Secondary | ICD-10-CM | POA: Diagnosis not present

## 2015-04-06 DIAGNOSIS — M25519 Pain in unspecified shoulder: Secondary | ICD-10-CM | POA: Diagnosis not present

## 2015-04-06 DIAGNOSIS — F79 Unspecified intellectual disabilities: Secondary | ICD-10-CM | POA: Diagnosis not present

## 2015-04-06 DIAGNOSIS — W57XXXA Bitten or stung by nonvenomous insect and other nonvenomous arthropods, initial encounter: Secondary | ICD-10-CM

## 2015-04-06 DIAGNOSIS — F459 Somatoform disorder, unspecified: Secondary | ICD-10-CM | POA: Insufficient documentation

## 2015-04-06 DIAGNOSIS — R202 Paresthesia of skin: Secondary | ICD-10-CM | POA: Diagnosis not present

## 2015-04-06 LAB — CK: CK TOTAL: 25 U/L (ref 7–177)

## 2015-04-06 LAB — HEPATIC FUNCTION PANEL
ALT: 10 U/L (ref 0–35)
AST: 12 U/L (ref 0–37)
Albumin: 4.1 g/dL (ref 3.5–5.2)
Alkaline Phosphatase: 59 U/L (ref 39–117)
BILIRUBIN DIRECT: 0.1 mg/dL (ref 0.0–0.3)
BILIRUBIN TOTAL: 0.4 mg/dL (ref 0.2–1.2)
Total Protein: 7.3 g/dL (ref 6.0–8.3)

## 2015-04-06 LAB — SEDIMENTATION RATE: SED RATE: 13 mm/h (ref 0–22)

## 2015-04-06 LAB — TSH: TSH: 0.73 u[IU]/mL (ref 0.35–4.50)

## 2015-04-06 LAB — VITAMIN B12: Vitamin B-12: 290 pg/mL (ref 211–911)

## 2015-04-06 LAB — URIC ACID: URIC ACID, SERUM: 5.9 mg/dL (ref 2.4–7.0)

## 2015-04-06 NOTE — Assessment & Plan Note (Addendum)
10/16 chronic - likely related to her OCD

## 2015-04-06 NOTE — Assessment & Plan Note (Signed)
10/16 B pain ?etiology - likely MSK related to psychosomatic issues and chronic pain syndrome Labs May need a Neurology referal

## 2015-04-06 NOTE — Assessment & Plan Note (Signed)
Compulsive scratching Discussed

## 2015-04-06 NOTE — Progress Notes (Signed)
Pre visit review using our clinic review tool, if applicable. No additional management support is needed unless otherwise documented below in the visit note. 

## 2015-04-06 NOTE — Assessment & Plan Note (Signed)
Chronic - fear of sickness, dying Pt said that she is disabled due to PTSD, OCD. 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 Her psychiatrist - Dr Shela Commons. ([facility]) - Klonopin, Vistaril, Lexapro. Discussed

## 2015-04-06 NOTE — Assessment & Plan Note (Signed)
Chronic fear of disease, death; OCD Discussed

## 2015-04-06 NOTE — Progress Notes (Signed)
Subjective:  Patient ID: [name], female    DOB: Jul 29, 1970  Age: 44 y.o. MRN: [id number]  CC: No chief complaint on file.   HPI [name] presents today with multiple complaints. C/o B shoulders and hands being weak and tender/painful. "My hands go numb x 1 month". Pt had 10 IM injections of Gentamycin in B shoulders in early August for a resistant UTI (Dr Logan Bores). 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 C/o chronic LBP and pains everywhere: Pain Clinic in [place] for shots.. Pt is on Oxycodone. "I was fine until I had a bladder sling 2 years ago" Pt said that she is disabled due to PTSD, OCD. 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 Her psychiatrist - Dr Shela Commons. ([facility]) - Klonopin, Vistaril, Lexapro.  Outpatient Prescriptions Prior to Visit  Medication Sig Dispense Refill  . baclofen (LIORESAL) 20 MG tablet Take 20 mg by mouth 3 (three) times daily.    . clonazePAM (KLONOPIN) 1 MG tablet Take 3 mg by mouth 3 (three) times daily.    Marland Kitchen escitalopram (LEXAPRO) 10 MG tablet Take 10 mg by mouth 2 (two) times daily.   0  . FREESTYLE LITE test strip TEST twice a day 100 each 3  . hydrOXYzine (VISTARIL) 50 MG capsule TAKE ONE CAPSULE BY MOUTH TWICE A DAY AS NEEDED 60 capsule 3  . phenazopyridine (PYRIDIUM) 200 MG tablet Take 1 tablet (200 mg total) by mouth 3 (three) times daily as needed (urinary pain). 60 tablet 0  . polyethylene glycol (MIRALAX / GLYCOLAX) packet Take 17 g by mouth daily. 14 each 0  . pravastatin (PRAVACHOL) 40 MG tablet TAKE 1 TABLET EVERY DAY 90 tablet 0  . ranitidine (ZANTAC) 150 MG tablet Take 150-300 mg by mouth 2 (two) times a week.     . Saxagliptin-Metformin (KOMBIGLYZE XR) 10-998 MG TB24 Take 1 tablet by mouth daily. 30 tablet 5  . oxyCODONE-acetaminophen (PERCOCET/ROXICET) 5-325 MG per tablet Take by mouth every 4 (four) hours as needed for severe pain.     No  facility-administered medications prior to visit.    ROS Review of Systems  Constitutional: Positive for fatigue and unexpected weight change. Negative for chills, activity change and appetite change.  HENT: Negative for congestion, mouth sores and sinus pressure.   Eyes: Negative for visual disturbance.  Respiratory: Negative for cough, chest tightness and shortness of breath.   Gastrointestinal: Negative for nausea, vomiting and abdominal pain.  Genitourinary: Negative for frequency, difficulty urinating and vaginal pain.  Musculoskeletal: Positive for myalgias, back pain, arthralgias, gait problem, neck pain and neck stiffness. Negative for joint swelling.  Skin: Positive for rash and wound. Negative for pallor.  Neurological: Positive for weakness and light-headedness. Negative for dizziness, tremors, syncope, numbness and headaches.  Psychiatric/Behavioral: Positive for behavioral problems and decreased concentration. Negative for suicidal ideas, hallucinations, confusion, sleep disturbance and agitation. The patient is nervous/anxious.     Objective:  BP 90/68 mmHg  Pulse 83  Temp(Src) 99.1 F (37.3 C) (Oral)  Wt 218 lb (98.884 kg)  SpO2 94%  LMP 11/12/2010  BP Readings from Last 3 Encounters:  04/06/15 90/68  02/24/15 102/74  02/13/15 108/64    Wt Readings from Last 3 Encounters:  04/06/15 218 lb (98.884 kg)  02/24/15 214 lb (97.07 kg)  02/13/15 213 lb 4 oz (96.73 kg)    Physical Exam  Constitutional: She appears well-developed. No distress.  HENT:  Head: Normocephalic.  Right Ear: External ear normal.  Left Ear: External ear normal.  Nose: Nose normal.  Mouth/Throat: Oropharynx is clear and moist.  Eyes: Conjunctivae are normal. Pupils are equal, round, and reactive to light. Right eye exhibits no discharge. Left eye exhibits no discharge.  Neck: Normal range of motion. Neck supple. No JVD present. No tracheal deviation present. No thyromegaly present.    Cardiovascular: Normal rate, regular rhythm and normal heart sounds.   Pulmonary/Chest: No stridor. No respiratory distress. She has no wheezes.  Abdominal: Soft. Bowel sounds are normal. She exhibits no distension and no mass. There is no tenderness. There is no rebound and no guarding.  Musculoskeletal: She exhibits tenderness. She exhibits no edema.  Lymphadenopathy:    She has no cervical adenopathy.  Neurological: She displays normal reflexes. No cranial nerve deficit. She exhibits normal muscle tone. Coordination normal.  Skin: Rash noted. No erythema.  Psychiatric: Judgment and thought content normal.  Flat affect A/o/c Multiple excoriations, skin ulcers, scars all over Shoulders are tender w/ROM - full ROM MS is grossly intact No pronator drift Romberg (+/-)  Lab Results  Component Value Date   WBC 8.2 02/24/2015   HGB 13.8 02/24/2015   HCT 41.4 02/24/2015   PLT 231.0 02/24/2015   GLUCOSE 112* 02/24/2015   CHOL 136 02/24/2015   TRIG 75.0 02/24/2015   HDL 45.50 02/24/2015   LDLCALC 76 02/24/2015   ALT 10 02/24/2015   AST 14 02/24/2015   NA 138 02/24/2015   K 4.5 02/24/2015   CL 102 02/24/2015   CREATININE 0.64 02/24/2015   BUN 12 02/24/2015   CO2 31 02/24/2015   TSH 0.56 09/22/2013   HGBA1C 6.0 02/24/2015   MICROALBUR 0.3 03/13/2013    Dg Lumbar Spine Complete  11/27/2014   CLINICAL DATA:  44 year old female with a history of chronic lumbar back pain  EXAM: LUMBAR SPINE - COMPLETE 4+ VIEW  COMPARISON:  CT 09/21/2016, plain film 08/31/2014  FINDINGS: Lumbar Spine:  Lumbar vertebral elements maintain normal alignment without evidence of subluxation. Trace retrolisthesis of L5 on S1.  No fracture line identified. Vertebral body heights maintained as well as disc space heights.  No significant degenerative disc disease or endplate changes. No significant facet changes.  Unremarkable appearance of the visualized abdomen.  Oblique images demonstrate no displaced pars  defect.  Surgical clips in the upper right abdomen.  IMPRESSION: Negative for acute fracture or malalignment of the lumbar spine.  Signed,  Yvone Neu. Loreta Ave, DO  [facility]  [facility]   Electronically Signed   By: Gilmer Mor D.O.   On: 11/27/2014 13:40   Dg Hips Bilat With Pelvis Min 5 Views  11/27/2014   CLINICAL DATA:  Chronic low pelvic pain for 9 months.  EXAM: BILATERAL HIP (WITH PELVIS) 5-6 VIEWS  COMPARISON:  None.  FINDINGS: Hip joints and SI joints are symmetric and unremarkable. No acute bony abnormality. Specifically, no fracture, subluxation, or dislocation. Soft tissues are intact.  IMPRESSION: No acute bony abnormality.   Electronically Signed   By: Charlett Nose [name]   On: 11/27/2014 13:40    Assessment & Plan:   There are no diagnoses linked to this encounter. I am having Ms. Medearis maintain her escitalopram, ranitidine, polyethylene glycol, clonazePAM, hydrOXYzine, baclofen, pravastatin, phenazopyridine, Saxagliptin-Metformin, FREESTYLE LITE, hydrocortisone, ondansetron, triamcinolone ointment, pregabalin, and oxyCODONE-acetaminophen.  Meds ordered this encounter  Medications  . hydrocortisone (ANUSOL-HC) 2.5 % rectal cream  Sig: Place rectally.  . ondansetron (ZOFRAN) 4 MG tablet    Sig: TAKE 1 TABLET BY MOUTH EVERY 8 HOURS AS NEEDED FOR 7 DAYS FOR NAUSEA/VOMITING  . triamcinolone ointment (KENALOG) 0.1 %    Sig:   . pregabalin (LYRICA) 75 MG capsule    Sig:   . oxyCODONE-acetaminophen (PERCOCET) 10-325 MG tablet    Sig: TAKE 1 TABLET FOUR TIMES A DAY AS NEEDED    Refill:  0     Follow-up: No Follow-up on file.  Sonda Primes, MD

## 2015-04-06 NOTE — Assessment & Plan Note (Signed)
Discussed.

## 2015-04-14 ENCOUNTER — Encounter: Payer: Self-pay | Admitting: Family

## 2015-04-14 ENCOUNTER — Ambulatory Visit (INDEPENDENT_AMBULATORY_CARE_PROVIDER_SITE_OTHER): Payer: Medicare Other | Admitting: Family

## 2015-04-14 ENCOUNTER — Other Ambulatory Visit (INDEPENDENT_AMBULATORY_CARE_PROVIDER_SITE_OTHER): Payer: Medicare Other

## 2015-04-14 VITALS — BP 110/92 | HR 75 | Temp 98.0°F | Resp 18 | Ht 66.0 in | Wt 213.8 lb

## 2015-04-14 DIAGNOSIS — M25519 Pain in unspecified shoulder: Secondary | ICD-10-CM | POA: Diagnosis not present

## 2015-04-14 DIAGNOSIS — F429 Obsessive-compulsive disorder, unspecified: Secondary | ICD-10-CM

## 2015-04-14 LAB — CBC WITH DIFFERENTIAL/PLATELET
BASOS PCT: 0.6 % (ref 0.0–3.0)
Basophils Absolute: 0 10*3/uL (ref 0.0–0.1)
EOS ABS: 0.3 10*3/uL (ref 0.0–0.7)
EOS PCT: 3.2 % (ref 0.0–5.0)
HEMATOCRIT: 42.1 % (ref 36.0–46.0)
HEMOGLOBIN: 13.7 g/dL (ref 12.0–15.0)
LYMPHS PCT: 31.1 % (ref 12.0–46.0)
Lymphs Abs: 2.7 10*3/uL (ref 0.7–4.0)
MCHC: 32.5 g/dL (ref 30.0–36.0)
MCV: 89.1 fl (ref 78.0–100.0)
MONOS PCT: 7.8 % (ref 3.0–12.0)
Monocytes Absolute: 0.7 10*3/uL (ref 0.1–1.0)
Neutro Abs: 4.9 10*3/uL (ref 1.4–7.7)
Neutrophils Relative %: 57.3 % (ref 43.0–77.0)
PLATELETS: 230 10*3/uL (ref 150.0–400.0)
RBC: 4.73 Mil/uL (ref 3.87–5.11)
RDW: 14.5 % (ref 11.5–15.5)
WBC: 8.6 10*3/uL (ref 4.0–10.5)
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
## 2015-04-14 NOTE — Patient Instructions (Addendum)
Thank you for choosing [facility].  Summary/Instructions:  If your symptoms worsen or fail to improve, please contact our office for further instruction, or in case of emergency go directly to the emergency room at the closest medical facility.    Chronic Pain Chronic pain can be defined as pain that is off and on and lasts for 3-6 months or longer. Many things cause chronic pain, which can make it difficult to make a diagnosis. There are many treatment options available for chronic pain. However, finding a treatment that works well for you may require trying various approaches until the right one is found. Many people benefit from a combination of two or more types of treatment to control their pain. SYMPTOMS  Chronic pain can occur anywhere in the body and can range from mild to very severe. Some types of chronic pain include:  Headache.  Low back pain.  Cancer pain.  Arthritis pain.  Neurogenic pain. This is pain resulting from damage to nerves. People with chronic pain may also have other symptoms such as:  Depression.  Anger.  Insomnia.  Anxiety. DIAGNOSIS  Your health care provider will help diagnose your condition over time. In many cases, the initial focus will be on excluding possible conditions that could be causing the pain. Depending on your symptoms, your health care provider may order tests to diagnose your condition. Some of these tests may include:   Blood tests.   CT scan.   MRI.   X-rays.   Ultrasounds.   Nerve conduction studies.  You may need to see a specialist.  TREATMENT  Finding treatment that works well may take time. You may be referred to a pain specialist. He or she may prescribe medicine or therapies, such as:   Mindful meditation or yoga.  Shots (injections) of numbing or pain-relieving medicines into the spine or area of pain.  Local electrical stimulation.  Acupuncture.   Massage therapy.   Aroma, color, light,  or sound therapy.   Biofeedback.   Working with a physical therapist to keep from getting stiff.   Regular, gentle exercise.   Cognitive or behavioral therapy.   Group support.  Sometimes, surgery may be recommended.  HOME CARE INSTRUCTIONS   Take all medicines as directed by your health care provider.   Lessen stress in your life by relaxing and doing things such as listening to calming music.   Exercise or be active as directed by your health care provider.   Eat a healthy diet and include things such as vegetables, fruits, fish, and lean meats in your diet.   Keep all follow-up appointments with your health care provider.   Attend a support group with others suffering from chronic pain. SEEK MEDICAL CARE IF:   Your pain gets worse.   You develop a new pain that was not there before.   You cannot tolerate medicines given to you by your health care provider.   You have new symptoms since your last visit with your health care provider.  SEEK IMMEDIATE MEDICAL CARE IF:   You feel weak.   You have decreased sensation or numbness.   You lose control of bowel or bladder function.   Your pain suddenly gets much worse.   You develop shaking.  You develop chills.  You develop confusion.  You develop chest pain.  You develop shortness of breath.  MAKE SURE YOU:  Understand these instructions.  Will watch your condition.  Will get help right away if you  are not doing well or get worse.   This information is not intended to replace advice given to you by your health care provider. Make sure you discuss any questions you have with your health care provider.   Document Released: 03/04/2002 Document Revised: 02/12/2013 Document Reviewed: 12/06/2012 Elsevier Interactive Patient Education [employer]2016 [employer]vier Inc.

## 2015-04-14 NOTE — Progress Notes (Signed)
Pre visit review using our clinic review tool, if applicable. No additional management support is needed unless otherwise documented below in the visit note. 

## 2015-04-14 NOTE — Assessment & Plan Note (Signed)
Agree with previous assessment of pain located in bilateral shoulders and neck most likely related to psychosomatic symptoms with no obvious muscle skeletal-related trauma. Question chronic pain syndrome. Refer to neurology for further workup given description of neuropathic pain. Continue current dosage of Lyrica. Discussed potential for Cymbalta which patient declined at this time.

## 2015-04-14 NOTE — Assessment & Plan Note (Signed)
Continues to express fear of sickness and sepsis. Believes previous gentamicin shots resulted in toxicity and this toxicity continues to be the cause of her shoulder and neck pain. Obtain gentamicin level. Obtain CBC to rule out infection. Reassured patient this is most likely chronic pain related. Follow-up pending neurology referral. Recommend follow-up with pain management for further evaluation of chronic pain.

## 2015-04-15 ENCOUNTER — Encounter: Payer: Self-pay | Admitting: Family

## 2015-04-15 DIAGNOSIS — M2550 Pain in unspecified joint: Secondary | ICD-10-CM

## 2015-04-15 LAB — GENTAMICIN LEVEL, RANDOM

## 2015-04-19 ENCOUNTER — Ambulatory Visit: Payer: Medicare Other | Admitting: Neurology

## 2015-04-21 ENCOUNTER — Ambulatory Visit: Payer: Medicare Other | Admitting: Neurology

## 2015-05-15 ENCOUNTER — Other Ambulatory Visit: Payer: Self-pay | Admitting: Family

## 2015-05-22 ENCOUNTER — Encounter: Payer: Self-pay | Admitting: Family

## 2015-05-22 ENCOUNTER — Ambulatory Visit (INDEPENDENT_AMBULATORY_CARE_PROVIDER_SITE_OTHER): Payer: Medicare Other | Admitting: Family

## 2015-05-22 VITALS — BP 120/78 | HR 85 | Temp 98.8°F | Ht 66.0 in | Wt 219.0 lb

## 2015-05-22 DIAGNOSIS — M2550 Pain in unspecified joint: Secondary | ICD-10-CM | POA: Diagnosis not present

## 2015-05-22 MED ORDER — MELOXICAM 7.5 MG PO TABS: 7.5000 mg | ORAL_TABLET | Freq: Every day | ORAL | Status: DC
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
## 2015-05-22 NOTE — Progress Notes (Signed)
Pre visit review using our clinic review tool, if applicable. No additional management support is needed unless otherwise documented below in the visit note. 

## 2015-05-22 NOTE — Patient Instructions (Signed)
Please go to the lab one day this week to have blood work drawn. Start meloxicam.

## 2015-05-24 ENCOUNTER — Telehealth: Payer: Self-pay | Admitting: Family Medicine

## 2015-05-24 NOTE — Telephone Encounter (Signed)
Received faxed msg from after hours line for telephone call from patient on 05/21/15 @ 1:42 pm c/o left arm pain. Patient stated that her arm has been hurting since August. She states that for the past 2 days she hasn't been able to do much because of arm hurting so bad. Patient stated that the pain started when she received injections in her arm of gentamycin. She was advised to see her pcp within the next 2 weeks and to keep a pain diary. She was advised to call back if arm swelling occurs, signs of infection, or if worse.   Dr. Karel JarvisAquino reviewed msg and agrees with advisement patient was given.

## 2015-05-25 ENCOUNTER — Other Ambulatory Visit (INDEPENDENT_AMBULATORY_CARE_PROVIDER_SITE_OTHER): Payer: Medicare Other

## 2015-05-25 ENCOUNTER — Encounter: Payer: Self-pay | Admitting: Family

## 2015-05-25 ENCOUNTER — Ambulatory Visit (INDEPENDENT_AMBULATORY_CARE_PROVIDER_SITE_OTHER): Payer: Medicare Other | Admitting: Family

## 2015-05-25 VITALS — BP 110/80 | HR 85 | Temp 98.3°F | Resp 18 | Ht 66.0 in | Wt 217.0 lb

## 2015-05-25 DIAGNOSIS — R52 Pain, unspecified: Secondary | ICD-10-CM

## 2015-05-25 DIAGNOSIS — M2550 Pain in unspecified joint: Secondary | ICD-10-CM

## 2015-05-25 LAB — C-REACTIVE PROTEIN: CRP: 0.4 mg/dL — AB (ref 0.5–20.0)

## 2015-05-25 LAB — SEDIMENTATION RATE: Sed Rate: 16 mm/hr (ref 0–22)

## 2015-05-25 MED ORDER — SUVOREXANT 10 MG PO TABS: 10.0000 mg | ORAL_TABLET | Freq: Every evening | ORAL | Status: DC | PRN

## 2015-05-25 NOTE — Assessment & Plan Note (Signed)
Total body pain of questionable origin although pain appears to be more severe than physical exam would anticipate. Continue to complete previous blood work to rule out autoimmune processes including rheumatoid arthritis. Symptoms consistent with fibromyalgia or possible chronic pain syndrome. Does meet criteria for her the 18 tender points for fibromyalgia. Continue conservative treatment pending lab work and follow-up with pain management.

## 2015-05-25 NOTE — Progress Notes (Signed)
Pre visit review using our clinic review tool, if applicable. No additional management support is needed unless otherwise documented below in the visit note. 

## 2015-05-25 NOTE — Patient Instructions (Signed)
Thank you for choosing [facility].  Summary/Instructions:  Please continue to take your medications as prescribed.  Please stop by the lab on the basement level of the building for your blood work. Your results will be released to MyChart (or called to you) after review, usually within 72 hours after test completion. If any changes need to be made, you will be notified at that same time.  If your symptoms worsen or fail to improve, please contact our office for further instruction, or in case of emergency go directly to the emergency room at the closest medical facility.   Myofascial Pain Syndrome and Fibromyalgia Myofascial pain syndrome and fibromyalgia are both pain disorders. This pain may be felt mainly in your muscles.   Myofascial pain syndrome:  Always has trigger points or tender points in the muscle that will cause pain when pressed. The pain may come and go.  Usually affects your neck, upper back, and shoulder areas. The pain often radiates into your arms and hands.  Fibromyalgia:  Has muscle pains and tenderness that come and go.  Is often associated with fatigue and sleep disturbances.  Has trigger points.  Tends to be long-lasting (chronic), but is not life-threatening. Fibromyalgia and myofascial pain are not the same. However, they often occur together. If you have both conditions, each can make the other worse. Both are common and can cause enough pain and fatigue to make day-to-day activities difficult.  CAUSES  The exact causes of fibromyalgia and myofascial pain are not known. People with certain gene types may be more likely to develop fibromyalgia. Some factors can be triggers for both conditions, such as:   Spine disorders.  Arthritis.  Severe injury (trauma) and other physical stressors.  Being under a lot of stress.  A medical illness. SIGNS AND SYMPTOMS  Fibromyalgia The main symptom of fibromyalgia is widespread pain and tenderness in your  muscles. This can vary over time. Pain is sometimes described as stabbing, shooting, or burning. You may have tingling or numbness, too. You may also have sleep problems and fatigue. You may wake up feeling tired and groggy (fibro fog). Other symptoms may include:   Bowel and bladder problems.  Headaches.  Visual problems.  Problems with odors and noises.  Depression or mood changes.  Painful menstrual periods (dysmenorrhea).  Dry skin or eyes. Myofascial pain syndrome Symptoms of myofascial pain syndrome include:   Tight, ropy bands of muscle.   Uncomfortable sensations in muscular areas, such as:  Aching.  Cramping.  Burning.  Numbness.  Tingling.   Muscle weakness.  Trouble moving certain muscles freely (range of motion). DIAGNOSIS  There are no specific tests to diagnose fibromyalgia or myofascial pain syndrome. Both can be hard to diagnose because their symptoms are common in many other conditions. Your health care provider may suspect one or both of these conditions based on your symptoms and medical history. Your health care provider will also do a physical exam.  The key to diagnosing fibromyalgia is having pain, fatigue, and other symptoms for more than three months that cannot be explained by another condition.  The key to diagnosing myofascial pain syndrome is finding trigger points in muscles that are tender and cause pain elsewhere in your body (referred pain). TREATMENT  Treating fibromyalgia and myofascial pain often requires a team of health care providers. This usually starts with your primary provider and a physical therapist. You may also find it helpful to work with alternative health care providers, such as  massage therapists or acupuncturists. Treatment for fibromyalgia may include medicines. This may include nonsteroidal anti-inflammatory drugs (NSAIDs), along with other medicines.  Treatment for myofascial pain may also  include:  NSAIDs.  Cooling and stretching of muscles.  Trigger point injections.  Sound wave (ultrasound) treatments to stimulate muscles. HOME CARE INSTRUCTIONS   Take medicines only as directed by your health care provider.  Exercise as directed by your health care provider or physical therapist.  Try to avoid stressful situations.  Practice relaxation techniques to control your stress. You may want to try:  Biofeedback.  Visual imagery.  Hypnosis.  Muscle relaxation.  Yoga.  Meditation.  Talk to your health care provider about alternative treatments, such as acupuncture or massage treatment.  Maintain a healthy lifestyle. This includes eating a healthy diet and getting enough sleep.  Consider joining a support group.  Do not do activities that stress or strain your muscles. That includes repetitive motions and heavy lifting. SEEK MEDICAL CARE IF:   You have new symptoms.  Your symptoms get worse.  You have side effects from your medicines.  You have trouble sleeping.  Your condition is causing depression or anxiety. FOR MORE INFORMATION   National Fibromyalgia Association: http://www.fmaware.orgwww.fmaware.org  Arthritis Foundation: http://www.arthritis.orgwww.arthritis.org  American Chronic Pain Association: michaeledo.com.CandyDash.co.za   This information is not intended to replace advice given to you by your health care provider. Make sure you discuss any questions you have with your health care provider.   Document Released: 06/12/2005 Document Revised: 07/03/2014 Document Reviewed: 03/18/2014 Elsevier Interactive Patient Education [employer]

## 2015-05-25 NOTE — Progress Notes (Signed)
Subjective:    Patient ID: Crystal CritchleyAnissa Nelson, female    DOB: 08/04/1970, 44 y.o.   MRN: [id number]  Chief Complaint  Patient presents with  . Arm Pain    states she hasnt slept in 3 nights, she hurts everywhere and both of her arms are in pain    HPI:  Crystal Critchleynissa Mayweather is a 44 y.o. female who  has a past medical history of Hyperlipidemia; Anxiety; PTSD (post-traumatic stress disorder); Type 2 diabetes mellitus (HCC); Generalized anxiety disorder; Depression; IBS (irritable bowel syndrome); Pelvic pain in female; History of panic attacks; Neuropathy, peripheral (HCC); GERD (gastroesophageal reflux disease); Eczema; Frequency of urination; Urgency of urination; and Wears glasses. and presents today for a follow up office visit.  1.) Arthralgias - Associated symptoms of multiple arthralgias has been going on for several months.  Previous assessment of shoulder pain was generalized with no specific points. There is also questionable chronic pain syndrome or fibromyalgia. Continues to experience the associated symptoms of total body pain, arthralgias and sleep disturbance. She is currently under the care of pain management for her pelvis. Previous blood work has not been completed to date. Severity of the pain is enough to feel like she is dying.  Allergies  Allergen Reactions  . Macrobid [employer][Nitrofurantoin Monohyd Macro] Other (See Comments)    "Drains fluids out and can not walk" requiring hospitalization  . Nitrofuran Derivatives     Other reaction(s): Other paralysis  . Sulfa Antibiotics Nausea And Vomiting  . Codeine Nausea And Vomiting  . Doxycycline     "doesn't make me feel good"  . Gabapentin Rash    Severe itching in vaginal area     Current Outpatient Prescriptions on File Prior to Visit  Medication Sig Dispense Refill  . baclofen (LIORESAL) 20 MG tablet Take 20 mg by mouth 3 (three) times daily.    . clonazePAM (KLONOPIN) 1 MG tablet Take 3 mg by mouth 3 (three) times daily.    .  Cyanocobalamin (VITAMIN B-12) 500 MCG SUBL Place 500 mcg under the tongue.    . escitalopram (LEXAPRO) 10 MG tablet Take 10 mg by mouth 2 (two) times daily.   0  . FREESTYLE LITE test strip TEST twice a day 100 each 3  . hydrocortisone (ANUSOL-HC) 2.5 % rectal cream Place rectally.    . hydrOXYzine (VISTARIL) 50 MG capsule TAKE ONE CAPSULE BY MOUTH TWICE A DAY AS NEEDED 60 capsule 3  . meloxicam (MOBIC) 7.5 MG tablet Take 1 tablet (7.5 mg total) by mouth daily. 14 tablet 0  . ondansetron (ZOFRAN) 4 MG tablet TAKE 1 TABLET BY MOUTH EVERY 8 HOURS AS NEEDED FOR 7 DAYS FOR NAUSEA/VOMITING    . OXcarbazepine (TRILEPTAL) 150 MG tablet Take 1 tab QHS for 5 days then take 2 tabs QHS for 5 days then take 1 tab in AM and 2 tabs QHS for 5 days then take 2 tabs in AM and 2 tabs QHS for 5 days then take 2 tabs in AM, 1 tab at lunch and 2 tabs QHS for 5 days then take 2 tabs in AM, 2 tabs at lunch and 2 tabs QHS and continue.    Marland Kitchen. oxyCODONE-acetaminophen (PERCOCET) 10-325 MG tablet TAKE 1 TABLET FOUR TIMES A DAY AS NEEDED  0  . phenazopyridine (PYRIDIUM) 200 MG tablet Take 1 tablet (200 mg total) by mouth 3 (three) times daily as needed (urinary pain). 60 tablet 0  . polyethylene glycol (MIRALAX / GLYCOLAX) packet Take 17 g  by mouth daily. 14 each 0  . pravastatin (PRAVACHOL) 40 MG tablet TAKE 1 TABLET EVERY DAY 90 tablet 0  . ranitidine (ZANTAC) 150 MG tablet Take 150-300 mg by mouth 2 (two) times a week.     . Saxagliptin-Metformin (KOMBIGLYZE XR) 10-998 MG TB24 Take 1 tablet by mouth daily. 30 tablet 5  . triamcinolone ointment (KENALOG) 0.1 %     . Vitamin D, Cholecalciferol, 400 UNITS CAPS Take 1 capsule by mouth.    . Wound Dressings (ATRAPRO HYDROGEL) GEL Apply topically.     No current facility-administered medications on file prior to visit.    Review of Systems  Constitutional: Negative for fever and chills.  Musculoskeletal: Positive for myalgias, back pain, arthralgias, neck pain and neck  stiffness.  Neurological: Negative for weakness and numbness.  Psychiatric/Behavioral: Positive for sleep disturbance.      Objective:    BP 110/80 mmHg  Pulse 85  Temp(Src) 98.3 F (36.8 C) (Oral)  Resp 18  Ht  (1.676 m)  Wt 217 lb (98.431 kg)  BMI 35.04 kg/m2  SpO2 97%  LMP 11/12/2010 Nursing note and vital signs reviewed.  Physical Exam  Constitutional: She is oriented to person, place, and time. She appears well-developed and well-nourished. No distress.  Cardiovascular: Normal rate, regular rhythm, normal heart sounds and intact distal pulses.   Pulmonary/Chest: Effort normal and breath sounds normal.  Musculoskeletal:  Multiple joints with no obvious deformity, his coloration, or edema noted. Tender points noted bilaterally include under the lower sternocleidomastoid, near the second costochondral junction, 2 cm distal to the lateral condyle, prominence of the greater trochanter, medial fat pads of the knee, insertion of the suboccipital muscle, mid upper trapezius muscle, origin supraspinatus muscle, and upper outer quadrant of the buttocks.  Neurological: She is alert and oriented to person, place, and time.  Skin: Skin is warm and dry.  Psychiatric: She has a normal mood and affect. Her behavior is normal. Judgment and thought content normal.       Assessment & Plan:   Problem List Items Addressed This Visit      Other   Total body pain - Primary    Total body pain of questionable origin although pain appears to be more severe than physical exam would anticipate. Continue to complete previous blood work to rule out autoimmune processes including rheumatoid arthritis. Symptoms consistent with fibromyalgia or possible chronic pain syndrome. Does meet criteria for her the 18 tender points for fibromyalgia. Continue conservative treatment pending lab work and follow-up with pain management.

## 2015-05-26 LAB — ANA: ANA: NEGATIVE

## 2015-05-26 LAB — C3 AND C4
C3 Complement: 143 mg/dL (ref 90–180)
C4 Complement: 23 mg/dL (ref 10–40)

## 2015-05-27 ENCOUNTER — Encounter: Payer: Self-pay | Admitting: Family

## 2015-05-28 LAB — RFX DRVVT SCR W/RFLX CONF 1:1 MIX: dRVVT Screen: 43 s (ref <=45)

## 2015-05-28 LAB — RFX PTT-LA W/RFX TO HEX PHASE CONF: PTT-LA Screen: 39 s (ref <=40)

## 2015-05-28 LAB — LUPUS ANTICOAGULANT PANEL

## 2015-05-31 ENCOUNTER — Encounter: Payer: Self-pay | Admitting: Family

## 2015-06-01 ENCOUNTER — Encounter: Payer: Self-pay | Admitting: Family

## 2015-06-02 MED ORDER — ZOLPIDEM TARTRATE 5 MG PO TABS: 5.0000 mg | ORAL_TABLET | Freq: Every evening | ORAL | Status: DC | PRN

## 2015-06-07 ENCOUNTER — Encounter: Payer: Self-pay | Admitting: Family

## 2015-06-14 ENCOUNTER — Telehealth: Payer: Self-pay | Admitting: Family

## 2015-06-14 NOTE — Telephone Encounter (Signed)
Pt used to be a pt of [facility]. She call today and said she is not happy with him and is asking if you will except her as a patient

## 2015-06-14 NOTE — Telephone Encounter (Signed)
Not currently accepting transfers

## 2015-06-14 NOTE — Telephone Encounter (Signed)
See below Mrs. Cheryl 

## 2015-06-14 NOTE — Telephone Encounter (Signed)
See below

## 2015-06-15 NOTE — Telephone Encounter (Signed)
Spoke with pt she was advised of Dr Durene CalHunter decision

## 2015-06-22 ENCOUNTER — Encounter: Payer: Self-pay | Admitting: Family

## 2015-06-22 ENCOUNTER — Telehealth: Payer: Self-pay | Admitting: Family

## 2015-06-22 DIAGNOSIS — M797 Fibromyalgia: Secondary | ICD-10-CM

## 2015-06-25 ENCOUNTER — Other Ambulatory Visit (INDEPENDENT_AMBULATORY_CARE_PROVIDER_SITE_OTHER): Payer: Medicare Other

## 2015-06-25 ENCOUNTER — Encounter: Payer: Self-pay | Admitting: Family

## 2015-06-25 ENCOUNTER — Ambulatory Visit (INDEPENDENT_AMBULATORY_CARE_PROVIDER_SITE_OTHER): Payer: Medicare Other | Admitting: Family

## 2015-06-25 VITALS — BP 108/72 | HR 76 | Temp 98.1°F | Resp 18 | Ht 66.0 in | Wt 220.0 lb

## 2015-06-25 DIAGNOSIS — E1140 Type 2 diabetes mellitus with diabetic neuropathy, unspecified: Secondary | ICD-10-CM | POA: Diagnosis not present

## 2015-06-25 DIAGNOSIS — E7800 Pure hypercholesterolemia, unspecified: Secondary | ICD-10-CM

## 2015-06-25 LAB — HEMOGLOBIN A1C: HEMOGLOBIN A1C: 6.5 % (ref 4.6–6.5)

## 2015-06-25 MED ORDER — ZOLPIDEM TARTRATE 5 MG PO TABS: 5.0000 mg | ORAL_TABLET | Freq: Every evening | ORAL | Status: DC | PRN

## 2015-06-25 NOTE — Progress Notes (Signed)
Pre visit review using our clinic review tool, if applicable. No additional management support is needed unless otherwise documented below in the visit note. 

## 2015-06-25 NOTE — Assessment & Plan Note (Addendum)
Diabetes appears to be stable with current regimen. Denies adverse side effects. Obtain A1c. Continue current dosage of Kombiglyze pending A1c results. Currently maintained on pravastatin for CAD risk reduction which will be held for the next 2 weeks. Follow up in 6 months or sooner pending A1c results.

## 2015-06-25 NOTE — Patient Instructions (Signed)
Thank you for choosing [facility].  Summary/Instructions:  Stop pravastatin for the next 2 weeks.   Continue to take your medication as prescribed.   Your prescription(s) have been submitted to your pharmacy or been printed and provided for you. Please take as directed and contact our office if you believe you are having problem(s) with the medication(s) or have any questions.  Please stop by the lab on the basement level of the building for your blood work. Your results will be released to MyChart (or called to you) after review, usually within 72 hours after test completion. If any changes need to be made, you will be notified at that same time.  If your symptoms worsen or fail to improve, please contact our office for further instruction, or in case of emergency go directly to the emergency room at the closest medical facility.

## 2015-06-25 NOTE — Progress Notes (Signed)
Subjective:    Patient ID: Crystal CritchleyAnissa Nelson, female    DOB: 04/29/1971, 44 y.o.   MRN: [id number]  Chief Complaint  Patient presents with  . Follow-up    HPI:  [name] is a 44 y.o. female who  has a past medical history of Hyperlipidemia; Anxiety; PTSD (post-traumatic stress disorder); Type 2 diabetes mellitus (HCC); Generalized anxiety disorder; Depression; IBS (irritable bowel syndrome); Pelvic pain in female; History of panic attacks; Neuropathy, peripheral (HCC); GERD (gastroesophageal reflux disease); Eczema; Frequency of urination; Urgency of urination; and Wears glasses. and presents today for an office follow up.   1.) Type 2 diabetes - Currently maintained on Kombiglyze and reports her blood sugars as stable. Takes the medication as prescribed and denies adverse side effects. Denies symptoms of end organ damage.   Lab Results  Component Value Date   HGBA1C 6.5 06/25/2015    2.) Hypercholesterolemia - Currently maintained on pravastatin and reports that she takes the medication as prescribed and denies adverse side effects.   Lab Results  Component Value Date   CHOL 136 02/24/2015   HDL 45.50 02/24/2015   LDLCALC 76 02/24/2015   TRIG 75.0 02/24/2015   CHOLHDL 3 02/24/2015    Allergies  Allergen Reactions  . Macrobid [employer][Nitrofurantoin Monohyd Macro] Other (See Comments)    "Drains fluids out and can not walk" requiring hospitalization  . Nitrofuran Derivatives     Other reaction(s): Other paralysis  . Sulfa Antibiotics Nausea And Vomiting  . Codeine Nausea And Vomiting  . Doxycycline     "doesn't make me feel good"  . Gabapentin Rash    Severe itching in vaginal area     Current Outpatient Prescriptions on File Prior to Visit  Medication Sig Dispense Refill  . baclofen (LIORESAL) 20 MG tablet Take 20 mg by mouth 3 (three) times daily.    . clonazePAM (KLONOPIN) 1 MG tablet Take 3 mg by mouth 3 (three) times daily.    . Cyanocobalamin (VITAMIN B-12) 500 MCG  SUBL Place 500 mcg under the tongue.    . escitalopram (LEXAPRO) 10 MG tablet Take 10 mg by mouth 2 (two) times daily.   0  . FREESTYLE LITE test strip TEST twice a day 100 each 3  . hydrocortisone (ANUSOL-HC) 2.5 % rectal cream Place rectally.    . hydrOXYzine (VISTARIL) 50 MG capsule TAKE ONE CAPSULE BY MOUTH TWICE A DAY AS NEEDED 60 capsule 3  . meloxicam (MOBIC) 7.5 MG tablet Take 1 tablet (7.5 mg total) by mouth daily. 14 tablet 0  . ondansetron (ZOFRAN) 4 MG tablet TAKE 1 TABLET BY MOUTH EVERY 8 HOURS AS NEEDED FOR 7 DAYS FOR NAUSEA/VOMITING    . oxyCODONE-acetaminophen (PERCOCET) 10-325 MG tablet TAKE 1 TABLET FOUR TIMES A DAY AS NEEDED  0  . phenazopyridine (PYRIDIUM) 200 MG tablet Take 1 tablet (200 mg total) by mouth 3 (three) times daily as needed (urinary pain). 60 tablet 0  . polyethylene glycol (MIRALAX / GLYCOLAX) packet Take 17 g by mouth daily. 14 each 0  . pravastatin (PRAVACHOL) 40 MG tablet TAKE 1 TABLET EVERY DAY 90 tablet 0  . ranitidine (ZANTAC) 150 MG tablet Take 150-300 mg by mouth 2 (two) times a week.     . Saxagliptin-Metformin (KOMBIGLYZE XR) 10-998 MG TB24 Take 1 tablet by mouth daily. 30 tablet 5  . triamcinolone ointment (KENALOG) 0.1 %     . Vitamin D, Cholecalciferol, 400 UNITS CAPS Take 1 capsule by mouth.    .Marland Kitchen  Wound Dressings (ATRAPRO HYDROGEL) GEL Apply topically.    . OXcarbazepine (TRILEPTAL) 150 MG tablet Take 1 tab QHS for 5 days then take 2 tabs QHS for 5 days then take 1 tab in AM and 2 tabs QHS for 5 days then take 2 tabs in AM and 2 tabs QHS for 5 days then take 2 tabs in AM, 1 tab at lunch and 2 tabs QHS for 5 days then take 2 tabs in AM, 2 tabs at lunch and 2 tabs QHS and continue.     No current facility-administered medications on file prior to visit.     Review of Systems  Eyes:       Denies changes in vision  Endocrine: Negative for polydipsia, polyphagia and polyuria.  Neurological: Negative for numbness.      Objective:    BP 108/72  mmHg  Pulse 76  Temp(Src) 98.1 F (36.7 C) (Oral)  Resp 18  Ht  (1.676 m)  Wt 220 lb (99.791 kg)  BMI 35.53 kg/m2  SpO2 96%  LMP 11/12/2010 Nursing note and vital signs reviewed.  Physical Exam  Constitutional: She is oriented to person, place, and time. She appears well-developed and well-nourished. No distress.  Cardiovascular: Normal rate, regular rhythm, normal heart sounds and intact distal pulses.   Pulmonary/Chest: Effort normal and breath sounds normal.  Neurological: She is alert and oriented to person, place, and time.  Skin: Skin is warm and dry.  Psychiatric: She has a normal mood and affect. Her behavior is normal. Judgment and thought content normal.       Assessment & Plan:   Problem List Items Addressed This Visit      Endocrine   Type II diabetes mellitus (HCC) - Primary    Diabetes appears to be stable with current regimen. Denies adverse side effects. Obtain A1c. Continue current dosage of Kombiglyze pending A1c results. Currently maintained on pravastatin for CAD risk reduction which will be held for the next 2 weeks. Follow up in 6 months or sooner pending A1c results.       Relevant Orders   Hemoglobin A1c (Completed)     Other   HYPERCHOLESTEROLEMIA    Stable and maintained on pravastatin. Concern for pravastatin contributing to joint/muscle pain. Will attempt trial off of medication for 2 weeks and start Zetia in its place. If no relief is experienced, will discontinue Zetia and restart statin medication. Encouraged eating a nutrient dense diet that is moderate and varied and is low in saturated and trans fat. Follow up pending medication trial.

## 2015-06-25 NOTE — Assessment & Plan Note (Signed)
Stable and maintained on pravastatin. Concern for pravastatin contributing to joint/muscle pain. Will attempt trial off of medication for 2 weeks and start Zetia in its place. If no relief is experienced, will discontinue Zetia and restart statin medication. Encouraged eating a nutrient dense diet that is moderate and varied and is low in saturated and trans fat. Follow up pending medication trial.

## 2015-06-29 ENCOUNTER — Encounter: Payer: Self-pay | Admitting: Family

## 2015-07-05 ENCOUNTER — Ambulatory Visit: Payer: Medicare Other | Admitting: Family Medicine

## 2015-07-06 ENCOUNTER — Encounter: Payer: Self-pay | Admitting: Family

## 2015-07-06 ENCOUNTER — Ambulatory Visit (INDEPENDENT_AMBULATORY_CARE_PROVIDER_SITE_OTHER): Payer: Medicare Other | Admitting: Family

## 2015-07-06 VITALS — BP 110/82 | HR 83 | Temp 97.5°F | Resp 18 | Ht 66.0 in | Wt 222.0 lb

## 2015-07-06 DIAGNOSIS — J0100 Acute maxillary sinusitis, unspecified: Secondary | ICD-10-CM

## 2015-07-06 DIAGNOSIS — E1140 Type 2 diabetes mellitus with diabetic neuropathy, unspecified: Secondary | ICD-10-CM

## 2015-07-06 MED ORDER — AMOXICILLIN-POT CLAVULANATE 875-125 MG PO TABS: 1.0000 | ORAL_TABLET | Freq: Two times a day (BID) | ORAL | Status: DC

## 2015-07-06 MED ORDER — FREESTYLE LITE DEVI
Status: DC
Start: 2015-07-06 — End: 2015-07-08

## 2015-07-06 MED ORDER — GLUCOSE BLOOD VI STRP: ORAL_STRIP | Status: DC

## 2015-07-06 NOTE — Progress Notes (Signed)
Pre visit review using our clinic review tool, if applicable. No additional management support is needed unless otherwise documented below in the visit note. 

## 2015-07-06 NOTE — Assessment & Plan Note (Signed)
Symptoms and exam consistent with sinusitis. Potential for viral infection still remains. Patient advised watchful waiting to fill prescription in the next 2-3 days if symptoms worsen or do not improve with over-the-counter medications. Start over-the-counter medications as needed for symptom relief and supportive care. Follow-up if symptoms worsen or fail to improve.

## 2015-07-06 NOTE — Patient Instructions (Addendum)
Thank you for choosing [facility].  Summary/Instructions:  Your prescription(s) have been submitted to your pharmacy or been printed and provided for you. Please take as directed and contact our office if you believe you are having problem(s) with the medication(s) or have any questions.  If your symptoms worsen or fail to improve, please contact our office for further instruction, or in case of emergency go directly to the emergency room at the closest medical facility.   General Recommendations:    Please drink plenty of fluids.  Get plenty of rest   Sleep in humidified air  Use saline nasal sprays  Netti pot   OTC Medications:  Decongestants - helps relieve congestion   Flonase (generic fluticasone) or Nasacort (generic triamcinolone) - please make sure to use the "cross-over" technique at a 45 degree angle towards the opposite eye as opposed to straight up the nasal passageway.   If you have HIGH BLOOD PRESSURE - Coricidin HBP; AVOID any product that is -D as this contains pseudoephedrine which may increase your blood pressure.  Afrin (oxymetazoline) every 6-8 hours for up to 3 days.   Allergies - helps relieve runny nose, itchy eyes and sneezing   Claritin (generic loratidine), Allegra (fexofenidine), or Zyrtec (generic cyrterizine) for runny nose. These medications should not cause drowsiness.  Note - Benadryl (generic diphenhydramine) may be used however may cause drowsiness  Cough -   Delsym or Robitussin (generic dextromethorphan)  Expectorants - helps loosen mucus to ease removal   Mucinex (generic guaifenesin) as directed on the package.  Headaches / General Aches   Tylenol (generic acetaminophen) - DO NOT EXCEED 3 grams (3,000 mg) in a 24 hour time period  Advil/Motrin (generic ibuprofen)   Sore Throat -   Salt water gargle   Chloraseptic (generic benzocaine) spray or lozenges / Sucrets (generic dyclonine)    Sinusitis Sinusitis is  redness, soreness, and inflammation of the paranasal sinuses. Paranasal sinuses are air pockets within the bones of your face (beneath the eyes, the middle of the forehead, or above the eyes). In healthy paranasal sinuses, mucus is able to drain out, and air is able to circulate through them by way of your nose. However, when your paranasal sinuses are inflamed, mucus and air can become trapped. This can allow bacteria and other germs to grow and cause infection. Sinusitis can develop quickly and last only a short time (acute) or continue over a long period (chronic). Sinusitis that lasts for more than 12 weeks is considered chronic.  CAUSES  Causes of sinusitis include:  Allergies.  Structural abnormalities, such as displacement of the cartilage that separates your nostrils (deviated septum), which can decrease the air flow through your nose and sinuses and affect sinus drainage.  Functional abnormalities, such as when the small hairs (cilia) that line your sinuses and help remove mucus do not work properly or are not present. SIGNS AND SYMPTOMS  Symptoms of acute and chronic sinusitis are the same. The primary symptoms are pain and pressure around the affected sinuses. Other symptoms include:  Upper toothache.  Earache.  Headache.  Bad breath.  Decreased sense of smell and taste.  A cough, which worsens when you are lying flat.  Fatigue.  Fever.  Thick drainage from your nose, which often is green and may contain pus (purulent).  Swelling and warmth over the affected sinuses. DIAGNOSIS  Your health care provider will perform a physical exam. During the exam, your health care provider may:  Look in your   nose for signs of abnormal growths in your nostrils (nasal polyps).  Tap over the affected sinus to check for signs of infection.  View the inside of your sinuses (endoscopy) using an imaging device that has a light attached (endoscope). If your health care provider suspects  that you have chronic sinusitis, one or more of the following tests may be recommended:  Allergy tests.  Nasal culture. A sample of mucus is taken from your nose, sent to a lab, and screened for bacteria.  Nasal cytology. A sample of mucus is taken from your nose and examined by your health care provider to determine if your sinusitis is related to an allergy. TREATMENT  Most cases of acute sinusitis are related to a viral infection and will resolve on their own within 10 days. Sometimes medicines are prescribed to help relieve symptoms (pain medicine, decongestants, nasal steroid sprays, or saline sprays).  However, for sinusitis related to a bacterial infection, your health care provider will prescribe antibiotic medicines. These are medicines that will help kill the bacteria causing the infection.  Rarely, sinusitis is caused by a fungal infection. In theses cases, your health care provider will prescribe antifungal medicine. For some cases of chronic sinusitis, surgery is needed. Generally, these are cases in which sinusitis recurs more than 3 times per year, despite other treatments. HOME CARE INSTRUCTIONS   Drink plenty of water. Water helps thin the mucus so your sinuses can drain more easily.  Use a humidifier.  Inhale steam 3 to 4 times a day (for example, sit in the bathroom with the shower running).  Apply a warm, moist washcloth to your face 3 to 4 times a day, or as directed by your health care provider.  Use saline nasal sprays to help moisten and clean your sinuses.  Take medicines only as directed by your health care provider.  If you were prescribed either an antibiotic or antifungal medicine, finish it all even if you start to feel better. SEEK IMMEDIATE MEDICAL CARE IF:  You have increasing pain or severe headaches.  You have nausea, vomiting, or drowsiness.  You have swelling around your face.  You have vision problems.  You have a stiff neck.  You have  difficulty breathing. MAKE SURE YOU:   Understand these instructions.  Will watch your condition.  Will get help right away if you are not doing well or get worse. Document Released: 06/12/2005 Document Revised: 10/27/2013 Document Reviewed: 06/27/2011 [employer] Patient Information 2015 [employer]. This information is not intended to replace advice given to you by your health care provider. Make sure you discuss any questions you have with your health care provider.  

## 2015-07-06 NOTE — Progress Notes (Signed)
Subjective:    Patient ID: Crystal CritchleyAnissa Nelson, female    DOB: 09/24/1970, 45 y.o.   MRN: [id number]  Chief Complaint  Patient presents with  . Nasal Congestion    x2 days, sneezing, drainage, sinus headache and pressure, stuffy nose and ears    HPI:  Crystal Critchleynissa Fielden is a 45 y.o. female who  has a past medical history of Hyperlipidemia; Anxiety; PTSD (post-traumatic stress disorder); Type 2 diabetes mellitus (HCC); Generalized anxiety disorder; Depression; IBS (irritable bowel syndrome); Pelvic pain in female; History of panic attacks; Neuropathy, peripheral (HCC); GERD (gastroesophageal reflux disease); Eczema; Frequency of urination; Urgency of urination; and Wears glasses. and presents today for an acute office visit.  This is a new problem. Associated symptoms of sneezing, drainage, sinus headache and pressure, and stuffy nose/ears have been going on for approximately 2 days. Denies any modifying factors/treatments that make it better. Does have purulent nasal discharge described as yellow. Denies fevers. Denies recent antibiotic use.    Allergies  Allergen Reactions  . Macrobid [employer][Nitrofurantoin Monohyd Macro] Other (See Comments)    "Drains fluids out and can not walk" requiring hospitalization  . Nitrofuran Derivatives     Other reaction(s): Other paralysis  . Sulfa Antibiotics Nausea And Vomiting  . Codeine Nausea And Vomiting  . Doxycycline     "doesn't make me feel good"  . Gabapentin Rash    Severe itching in vaginal area     Current Outpatient Prescriptions on File Prior to Visit  Medication Sig Dispense Refill  . baclofen (LIORESAL) 20 MG tablet Take 20 mg by mouth 3 (three) times daily.    . clonazePAM (KLONOPIN) 1 MG tablet Take 3 mg by mouth 3 (three) times daily.    . Cyanocobalamin (VITAMIN B-12) 500 MCG SUBL Place 500 mcg under the tongue.    . DULoxetine (CYMBALTA) 30 MG capsule Take 30 mg by mouth daily.    Marland Kitchen. escitalopram (LEXAPRO) 10 MG tablet Take 10 mg by mouth 2  (two) times daily.   0  . hydrocortisone (ANUSOL-HC) 2.5 % rectal cream Place rectally.    . hydrOXYzine (VISTARIL) 50 MG capsule TAKE ONE CAPSULE BY MOUTH TWICE A DAY AS NEEDED 60 capsule 3  . meloxicam (MOBIC) 7.5 MG tablet Take 1 tablet (7.5 mg total) by mouth daily. 14 tablet 0  . ondansetron (ZOFRAN) 4 MG tablet TAKE 1 TABLET BY MOUTH EVERY 8 HOURS AS NEEDED FOR 7 DAYS FOR NAUSEA/VOMITING    . oxyCODONE-acetaminophen (PERCOCET) 10-325 MG tablet TAKE 1 TABLET FOUR TIMES A DAY AS NEEDED  0  . phenazopyridine (PYRIDIUM) 200 MG tablet Take 1 tablet (200 mg total) by mouth 3 (three) times daily as needed (urinary pain). 60 tablet 0  . polyethylene glycol (MIRALAX / GLYCOLAX) packet Take 17 g by mouth daily. 14 each 0  . pravastatin (PRAVACHOL) 40 MG tablet TAKE 1 TABLET EVERY DAY 90 tablet 0  . ranitidine (ZANTAC) 150 MG tablet Take 150-300 mg by mouth 2 (two) times a week.     . Saxagliptin-Metformin (KOMBIGLYZE XR) 10-998 MG TB24 Take 1 tablet by mouth daily. 30 tablet 5  . triamcinolone ointment (KENALOG) 0.1 %     . Vitamin D, Cholecalciferol, 400 UNITS CAPS Take 1 capsule by mouth.    . Wound Dressings (ATRAPRO HYDROGEL) GEL Apply topically.    Marland Kitchen. zolpidem (AMBIEN) 5 MG tablet Take 1 tablet (5 mg total) by mouth at bedtime as needed for sleep. 15 tablet 0  . OXcarbazepine (TRILEPTAL)  150 MG tablet Take 1 tab QHS for 5 days then take 2 tabs QHS for 5 days then take 1 tab in AM and 2 tabs QHS for 5 days then take 2 tabs in AM and 2 tabs QHS for 5 days then take 2 tabs in AM, 1 tab at lunch and 2 tabs QHS for 5 days then take 2 tabs in AM, 2 tabs at lunch and 2 tabs QHS and continue.     No current facility-administered medications on file prior to visit.    Review of Systems  Constitutional: Negative for fever and chills.  HENT: Positive for congestion, ear pain, sinus pressure and sneezing.   Respiratory: Negative for cough, chest tightness and shortness of breath.   Neurological: Positive  for headaches.      Objective:    BP 110/82 mmHg  Pulse 83  Temp(Src) 97.5 F (36.4 C) (Oral)  Resp 18  Ht 5\' 6"  (1.676 m)  Wt 222 lb (100.699 kg)  BMI 35.85 kg/m2  SpO2 97%  LMP 11/12/2010 Nursing note and vital signs reviewed.  Physical Exam  Constitutional: She is oriented to person, place, and time. She appears well-developed and well-nourished. No distress.  HENT:  Right Ear: Hearing, tympanic membrane, external ear and ear canal normal.  Left Ear: Hearing, tympanic membrane, external ear and ear canal normal.  Nose: Right sinus exhibits maxillary sinus tenderness and frontal sinus tenderness. Left sinus exhibits maxillary sinus tenderness and frontal sinus tenderness.  Mouth/Throat: Uvula is midline, oropharynx is clear and moist and mucous membranes are normal.  Cardiovascular: Normal rate, regular rhythm, normal heart sounds and intact distal pulses.   Pulmonary/Chest: Effort normal and breath sounds normal.  Neurological: She is alert and oriented to person, place, and time.  Skin: Skin is warm and dry.  Psychiatric: She has a normal mood and affect. Her behavior is normal. Judgment and thought content normal.       Assessment & Plan:   Problem List Items Addressed This Visit      Respiratory   Sinusitis - Primary    Symptoms and exam consistent with sinusitis. Potential for viral infection still remains. Patient advised watchful waiting to fill prescription in the next 2-3 days if symptoms worsen or do not improve with over-the-counter medications. Start over-the-counter medications as needed for symptom relief and supportive care. Follow-up if symptoms worsen or fail to improve.      Relevant Medications   amoxicillin-clavulanate (AUGMENTIN) 875-125 MG tablet     Endocrine   Type II diabetes mellitus (HCC)   Relevant Medications   Blood Glucose Monitoring Suppl (FREESTYLE LITE) DEVI   glucose blood (FREESTYLE LITE) test strip

## 2015-07-07 ENCOUNTER — Encounter: Payer: Self-pay | Admitting: Family

## 2015-07-08 ENCOUNTER — Encounter: Payer: Self-pay | Admitting: Family

## 2015-07-08 ENCOUNTER — Other Ambulatory Visit: Payer: Self-pay

## 2015-07-08 DIAGNOSIS — E1140 Type 2 diabetes mellitus with diabetic neuropathy, unspecified: Secondary | ICD-10-CM

## 2015-07-08 MED ORDER — FREESTYLE LITE DEVI: Status: DC

## 2015-07-08 MED ORDER — GLUCOSE BLOOD VI STRP: ORAL_STRIP | Status: DC

## 2015-07-19 ENCOUNTER — Other Ambulatory Visit: Payer: Self-pay

## 2015-07-19 ENCOUNTER — Encounter: Payer: Self-pay | Admitting: Family

## 2015-07-19 DIAGNOSIS — R300 Dysuria: Secondary | ICD-10-CM

## 2015-07-20 ENCOUNTER — Ambulatory Visit: Payer: Medicare Other | Admitting: Neurology

## 2015-07-22 ENCOUNTER — Encounter: Payer: Self-pay | Admitting: Family

## 2015-07-29 ENCOUNTER — Encounter: Payer: Self-pay | Admitting: Family

## 2015-07-29 MED ORDER — FLUCONAZOLE 150 MG PO TABS: 150.0000 mg | ORAL_TABLET | Freq: Once | ORAL | Status: DC

## 2015-07-30 MED ORDER — FLUCONAZOLE 150 MG PO TABS: 150.0000 mg | ORAL_TABLET | Freq: Once | ORAL | Status: DC

## 2015-08-01 ENCOUNTER — Other Ambulatory Visit: Payer: Self-pay | Admitting: Family

## 2015-08-02 NOTE — Telephone Encounter (Signed)
Faxed script back to [facility].../lmb 

## 2015-08-13 ENCOUNTER — Encounter: Payer: Self-pay | Admitting: Family

## 2015-08-13 ENCOUNTER — Ambulatory Visit: Payer: Medicare Other | Admitting: Family

## 2015-08-13 ENCOUNTER — Encounter (HOSPITAL_COMMUNITY): Payer: Self-pay | Admitting: Emergency Medicine

## 2015-08-13 ENCOUNTER — Ambulatory Visit (INDEPENDENT_AMBULATORY_CARE_PROVIDER_SITE_OTHER): Payer: Medicare Other | Admitting: Family

## 2015-08-13 ENCOUNTER — Emergency Department (HOSPITAL_COMMUNITY)
Admission: EM | Admit: 2015-08-13 | Discharge: 2015-08-13 | Disposition: A | Discharge status: Home / Self Care | Payer: Medicare Other | Attending: Emergency Medicine | Admitting: Emergency Medicine

## 2015-08-13 VITALS — BP 110/80 | HR 93 | Temp 98.5°F | Resp 16 | Ht 66.0 in | Wt 213.0 lb

## 2015-08-13 DIAGNOSIS — Z7984 Long term (current) use of oral hypoglycemic drugs: Secondary | ICD-10-CM | POA: Insufficient documentation

## 2015-08-13 DIAGNOSIS — F411 Generalized anxiety disorder: Secondary | ICD-10-CM | POA: Insufficient documentation

## 2015-08-13 DIAGNOSIS — R110 Nausea: Secondary | ICD-10-CM | POA: Diagnosis not present

## 2015-08-13 DIAGNOSIS — Z791 Long term (current) use of non-steroidal anti-inflammatories (NSAID): Secondary | ICD-10-CM | POA: Diagnosis not present

## 2015-08-13 DIAGNOSIS — Z789 Other specified health status: Secondary | ICD-10-CM

## 2015-08-13 DIAGNOSIS — T424X5A Adverse effect of benzodiazepines, initial encounter: Secondary | ICD-10-CM | POA: Diagnosis not present

## 2015-08-13 DIAGNOSIS — E785 Hyperlipidemia, unspecified: Secondary | ICD-10-CM | POA: Insufficient documentation

## 2015-08-13 DIAGNOSIS — R197 Diarrhea, unspecified: Secondary | ICD-10-CM | POA: Diagnosis not present

## 2015-08-13 DIAGNOSIS — K589 Irritable bowel syndrome without diarrhea: Secondary | ICD-10-CM | POA: Diagnosis not present

## 2015-08-13 DIAGNOSIS — Z3202 Encounter for pregnancy test, result negative: Secondary | ICD-10-CM | POA: Insufficient documentation

## 2015-08-13 DIAGNOSIS — Z872 Personal history of diseases of the skin and subcutaneous tissue: Secondary | ICD-10-CM | POA: Diagnosis not present

## 2015-08-13 DIAGNOSIS — F329 Major depressive disorder, single episode, unspecified: Secondary | ICD-10-CM | POA: Insufficient documentation

## 2015-08-13 DIAGNOSIS — K219 Gastro-esophageal reflux disease without esophagitis: Secondary | ICD-10-CM | POA: Diagnosis not present

## 2015-08-13 DIAGNOSIS — Z79899 Other long term (current) drug therapy: Secondary | ICD-10-CM | POA: Insufficient documentation

## 2015-08-13 DIAGNOSIS — E119 Type 2 diabetes mellitus without complications: Secondary | ICD-10-CM | POA: Diagnosis not present

## 2015-08-13 DIAGNOSIS — R5383 Other fatigue: Secondary | ICD-10-CM | POA: Diagnosis present

## 2015-08-13 DIAGNOSIS — F410 Panic disorder [episodic paroxysmal anxiety] without agoraphobia: Secondary | ICD-10-CM | POA: Diagnosis not present

## 2015-08-13 DIAGNOSIS — Z973 Presence of spectacles and contact lenses: Secondary | ICD-10-CM | POA: Diagnosis not present

## 2015-08-13 DIAGNOSIS — T43215A Adverse effect of selective serotonin and norepinephrine reuptake inhibitors, initial encounter: Secondary | ICD-10-CM | POA: Insufficient documentation

## 2015-08-13 DIAGNOSIS — G478 Other sleep disorders: Secondary | ICD-10-CM | POA: Insufficient documentation

## 2015-08-13 DIAGNOSIS — R109 Unspecified abdominal pain: Secondary | ICD-10-CM | POA: Insufficient documentation

## 2015-08-13 DIAGNOSIS — F17210 Nicotine dependence, cigarettes, uncomplicated: Secondary | ICD-10-CM | POA: Insufficient documentation

## 2015-08-13 LAB — URINALYSIS, ROUTINE W REFLEX MICROSCOPIC
Bilirubin Urine: NEGATIVE
GLUCOSE, UA: NEGATIVE mg/dL
Hgb urine dipstick: NEGATIVE
KETONES UR: 15 mg/dL — AB
LEUKOCYTES UA: NEGATIVE
NITRITE: NEGATIVE
PH: 7 (ref 5.0–8.0)
PROTEIN: NEGATIVE mg/dL
Specific Gravity, Urine: 1.016 (ref 1.005–1.030)

## 2015-08-13 LAB — COMPREHENSIVE METABOLIC PANEL
ALK PHOS: 67 U/L (ref 38–126)
ALT: 14 U/L (ref 14–54)
AST: 25 U/L (ref 15–41)
Albumin: 4.8 g/dL (ref 3.5–5.0)
Anion gap: 13 (ref 5–15)
BUN: 12 mg/dL (ref 6–20)
CALCIUM: 9.9 mg/dL (ref 8.9–10.3)
CO2: 27 mmol/L (ref 22–32)
CREATININE: 0.68 mg/dL (ref 0.44–1.00)
Chloride: 100 mmol/L — ABNORMAL LOW (ref 101–111)
Glucose, Bld: 134 mg/dL — ABNORMAL HIGH (ref 65–99)
Potassium: 4.7 mmol/L (ref 3.5–5.1)
SODIUM: 140 mmol/L (ref 135–145)
Total Bilirubin: 1.2 mg/dL (ref 0.3–1.2)
Total Protein: 8.9 g/dL — ABNORMAL HIGH (ref 6.5–8.1)

## 2015-08-13 LAB — I-STAT BETA HCG BLOOD, ED (MC, WL, AP ONLY): I-stat hCG, quantitative: <5 m[IU]/mL (ref <5)

## 2015-08-13 LAB — CBG MONITORING, ED: GLUCOSE-CAPILLARY: 139 mg/dL — AB (ref 65–99)

## 2015-08-13 LAB — CBC WITH DIFFERENTIAL/PLATELET
BASOS ABS: 0 10*3/uL (ref 0.0–0.1)
Basophils Relative: 1 %
EOS PCT: 1 %
Eosinophils Absolute: 0.1 10*3/uL (ref 0.0–0.7)
HEMATOCRIT: 45.6 % (ref 36.0–46.0)
Hemoglobin: 14.7 g/dL (ref 12.0–15.0)
LYMPHS PCT: 25 %
Lymphs Abs: 2.2 10*3/uL (ref 0.7–4.0)
MCH: 29.3 pg (ref 26.0–34.0)
MCHC: 32.2 g/dL (ref 30.0–36.0)
MCV: 91 fL (ref 78.0–100.0)
MONO ABS: 0.6 10*3/uL (ref 0.1–1.0)
MONOS PCT: 6 %
Neutro Abs: 5.9 10*3/uL (ref 1.7–7.7)
Neutrophils Relative %: 67 %
PLATELETS: 263 10*3/uL (ref 150–400)
RBC: 5.01 MIL/uL (ref 3.87–5.11)
RDW: 12.9 % (ref 11.5–15.5)
WBC: 8.7 10*3/uL (ref 4.0–10.5)

## 2015-08-13 LAB — TSH: TSH: 0.599 u[IU]/mL (ref 0.350–4.500)

## 2015-08-13 LAB — T4, FREE: Free T4: 0.89 ng/dL (ref 0.61–1.12)

## 2015-08-13 LAB — LIPASE, BLOOD: LIPASE: 28 U/L (ref 11–51)

## 2015-08-13 LAB — ETHANOL: Alcohol, Ethyl (B): <5 mg/dL (ref <5)

## 2015-08-13 MED ORDER — LORAZEPAM 2 MG/ML IJ SOLN
1.0000 mg | Freq: Once | INTRAMUSCULAR | Status: AC
  Administered 2015-08-13: 1 mg via INTRAVENOUS
  Filled 2015-08-13: qty 1

## 2015-08-13 MED ORDER — SODIUM CHLORIDE 0.9 % IV BOLUS (SEPSIS)
1000.0000 mL | Freq: Once | INTRAVENOUS | Status: AC
  Administered 2015-08-13: 1000 mL via INTRAVENOUS

## 2015-08-13 NOTE — ED Notes (Signed)
RN Johna Roles going to see if she can pull blood off patient's IV,

## 2015-08-13 NOTE — Discharge Instructions (Signed)
As discussed, your evaluation today has been largely reassuring.  But, it is important that you monitor your condition carefully, and do not hesitate to return to the ED if you develop new, or concerning changes in your condition. ? ?Otherwise, please follow-up with your physician for appropriate ongoing care. ? ?

## 2015-08-13 NOTE — Progress Notes (Signed)
Pre visit review using our clinic review tool, if applicable. No additional management support is needed unless otherwise documented below in the visit note. 
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
## 2015-08-13 NOTE — Patient Instructions (Addendum)
Thank you for choosing [facility].  Summary/Instructions:  If your symptoms worsen or fail to improve, please contact our office for further instruction, or in case of emergency go directly to the emergency room at the closest medical facility.   Generalized Anxiety Disorder Generalized anxiety disorder (GAD) is a mental disorder. It interferes with life functions, including relationships, work, and school. GAD is different from normal anxiety, which everyone experiences at some point in their lives in response to specific life events and activities. Normal anxiety actually helps [place] prepare for and get through these life events and activities. Normal anxiety goes away after the event or activity is over.  GAD causes anxiety that is not necessarily related to specific events or activities. It also causes excess anxiety in proportion to specific events or activities. The anxiety associated with GAD is also difficult to control. GAD can vary from mild to severe. People with severe GAD can have intense waves of anxiety with physical symptoms (panic attacks).  SYMPTOMS The anxiety and worry associated with GAD are difficult to control. This anxiety and worry are related to many life events and activities and also occur more days than not for 6 months or longer. People with GAD also have three or more of the following symptoms (one or more in children):  Restlessness.   Fatigue.  Difficulty concentrating.   Irritability.  Muscle tension.  Difficulty sleeping or unsatisfying sleep. DIAGNOSIS GAD is diagnosed through an assessment by your health care provider. Your health care provider will ask you questions aboutyour mood,physical symptoms, and events in your life. Your health care provider may ask you about your medical history and use of alcohol or drugs, including prescription medicines. Your health care provider may also do a physical exam and blood tests. Certain medical conditions  and the use of certain substances can cause symptoms similar to those associated with GAD. Your health care provider may refer you to a mental health specialist for further evaluation. TREATMENT The following therapies are usually used to treat GAD:   Medication. Antidepressant medication usually is prescribed for long-term daily control. Antianxiety medicines may be added in severe cases, especially when panic attacks occur.   Talk therapy (psychotherapy). Certain types of talk therapy can be helpful in treating GAD by providing support, education, and guidance. A form of talk therapy called cognitive behavioral therapy can teach you healthy ways to think about and react to daily life events and activities.  Stress managementtechniques. These include yoga, meditation, and exercise and can be very helpful when they are practiced regularly. A mental health specialist can help determine which treatment is best for you. Some people see improvement with one therapy. However, other people require a combination of therapies.   This information is not intended to replace advice given to you by your health care provider. Make sure you discuss any questions you have with your health care provider.   Document Released: 10/07/2012 Document Revised: 07/03/2014 Document Reviewed: 10/07/2012 Elsevier Interactive Patient Education 2016 Else[employer] Inc[employer]    Serotonin Syndrome Serotonin is a brain chemical that regulates the nervous system, which includes the brain, spinal cord, and nerves. Serotonin appears to play a role in all types of behavior, including appetite, emotions, movement, thinking, and response to stress. Excessively high levels of serotonin in the body can cause serotonin syndrome, which is a very dangerous condition. CAUSES This condition can be caused by taking medicines or drugs that increase the level of serotonin in your body. These include:  Antidepressant medicines.  Migraine  medicines.  Certain pain medicines.  Certain recreational drugs, including ecstasy, LSD, cocaine, and amphetamines.  Over-the-counter cough or cold medicines that contain dextromethorphan.  Certain herbal supplements, including St. John's wort, ginseng, and nutmeg. This condition usually occurs when you take these medicines or drugs in combination, but it can also happen with a high dose of a single medicine or drug. RISK FACTORS This condition is more likely to develop in:  People who have recently increased the dosage of medicine that increases the serotonin level.  People who just started taking medicine that increases the serotonin level. SYMPTOMS Symptoms of this condition usually happens within several hours of a medicine change. Symptoms include:  Headache.  Muscle twitching or stiffness.  Diarrhea.  Confusion.  Restlessness or agitation.  Shivering or goose bumps.  Loss of muscle coordination.  Rapid heart rate.  Sweating. Severe cases of serotonin syndromecan cause:  Irregular heartbeat.  Seizures.  Loss of consciousness.  High fever. DIAGNOSIS This condition is diagnosed with a medical history and physical exam. You will be asked aboutyour symptoms and your use of medicines and recreational drugs. Your health care provider may also order lab work or additional tests to rule out other causes of your symptoms. TREATMENT The treatment for this condition depends on the severity of your symptoms. For mild cases, stopping the medicine that caused your condition is usually all that is needed. For moderate to severe cases, hospitalization is required to monitor you and to prevent further muscle damage. HOME CARE INSTRUCTIONS  Take over-the-counter and prescription medicines only as told by your health care provider. This is important.  Check with your health care provider before you start taking any new prescriptions, over-the-counter medicines, herbs, or  supplements.  Avoid combining any medicines that can cause this condition to occur.  Keep all follow-up visits as told by your health care provider.This is important.  Maintain a healthy lifestyle.  Eat healthy foods.  Get plenty of sleep.  Exercise regularly.  Do not drink alcohol.  Do not use recreational drugs. SEEK MEDICAL CARE IF:  Medicines do not seem to be helping.  Your symptoms do not improve or they get worse.  You have trouble taking care of yourself. SEEK IMMEDIATE MEDICAL CARE IF:  You have worsening confusion, severe headache, chest pain, high fever, seizures, or loss of consciousness.  You have serious thoughts about hurting yourself or others.  You experience serious side effects of medicine, such as swelling of your face, lips, tongue, or throat.   This information is not intended to replace advice given to you by your health care provider. Make sure you discuss any questions you have with your health care provider.   Document Released: 07/20/2004 Document Revised: 10/27/2014 Document Reviewed: 06/25/2014 Elsevier Interactive Patient Education [employer]

## 2015-08-13 NOTE — Progress Notes (Signed)
Subjective:    Patient ID: [name], female    DOB: Nov 12, 1970, 45 y.o.   MRN: [id number]  Chief Complaint  Patient presents with  . Diarrhea    has not had sleep in a week, constant anxiety, has had diarrhea since a procedure monday, feels like her brain has been banging and her heart has been banging out of her chest since the procedure monday    HPI:  [name] is a 45 y.o. female who  has a past medical history of Hyperlipidemia; Anxiety; PTSD (post-traumatic stress disorder); Type 2 diabetes mellitus (HCC); Generalized anxiety disorder; Depression; IBS (irritable bowel syndrome); Pelvic pain in female; History of panic attacks; Neuropathy, peripheral (HCC); GERD (gastroesophageal reflux disease); Eczema; Frequency of urination; Urgency of urination; and Wears glasses. and presents today for an acute office visit.   This is a new problem. Assosciated symptom of diarrhea has been going on for about 4 days following a ketamine infusion. Also notes increased anxiety, decreased appeptite and sweaty palms and describes her chest is beating and banging. There have been recent medications changes by her psychiatry. She was weaned off of the Lexapro stopping it a couple of weeks ago and continues to maintain on Cymbalta. She self reduced her Cymbalta to 30 mg. She feels like she is going crazy. She then restarted an increased dosage of Cymbalta and then also restarted her Lexapro. Notes there have been increased anxiety with Cymbata in the past.   Allergies  Allergen Reactions  . Macrobid [employer] Macro] Other (See Comments)    "Drains fluids out and can not walk" requiring hospitalization  . Nitrofuran Derivatives     Other reaction(s): Other paralysis  . Sulfa Antibiotics Nausea And Vomiting  . Codeine Nausea And Vomiting  . Doxycycline     "doesn't make me feel good"  . Gabapentin Rash    Severe itching in vaginal area     Current Outpatient Prescriptions on File  Prior to Visit  Medication Sig Dispense Refill  . baclofen (LIORESAL) 20 MG tablet Take 20 mg by mouth 3 (three) times daily.    . Blood Glucose Monitoring Suppl (FREESTYLE LITE) DEVI Use device to check your blood sugar daily 1-4 times as instructed. 1 each 0  . clonazePAM (KLONOPIN) 1 MG tablet Take 3 mg by mouth 3 (three) times daily.    . Cyanocobalamin (VITAMIN B-12) 500 MCG SUBL Place 500 mcg under the tongue.    . DULoxetine (CYMBALTA) 30 MG capsule Take 30 mg by mouth daily.    Marland Kitchen escitalopram (LEXAPRO) 10 MG tablet Take 10 mg by mouth 2 (two) times daily.   0  . fluconazole (DIFLUCAN) 150 MG tablet Take 1 tablet (150 mg total) by mouth once. 1 tablet 0  . glucose blood (FREESTYLE LITE) test strip Use 1 test strip per test to test blood sugar 1-4 times daily as instructed. 100 each 3  . hydrocortisone (ANUSOL-HC) 2.5 % rectal cream Place rectally.    . hydrOXYzine (VISTARIL) 50 MG capsule TAKE ONE CAPSULE BY MOUTH TWICE A DAY AS NEEDED 60 capsule 3  . meloxicam (MOBIC) 7.5 MG tablet Take 1 tablet (7.5 mg total) by mouth daily. 14 tablet 0  . ondansetron (ZOFRAN) 4 MG tablet TAKE 1 TABLET BY MOUTH EVERY 8 HOURS AS NEEDED FOR 7 DAYS FOR NAUSEA/VOMITING    . oxyCODONE-acetaminophen (PERCOCET) 10-325 MG tablet TAKE 1 TABLET FOUR TIMES A DAY AS NEEDED  0  . phenazopyridine (PYRIDIUM) 200 MG  tablet Take 1 tablet (200 mg total) by mouth 3 (three) times daily as needed (urinary pain). 60 tablet 0  . polyethylene glycol (MIRALAX / GLYCOLAX) packet Take 17 g by mouth daily. 14 each 0  . pravastatin (PRAVACHOL) 40 MG tablet TAKE 1 TABLET EVERY DAY 90 tablet 0  . ranitidine (ZANTAC) 150 MG tablet Take 150-300 mg by mouth 2 (two) times a week.     . Saxagliptin-Metformin (KOMBIGLYZE XR) 10-998 MG TB24 Take 1 tablet by mouth daily. 30 tablet 5  . traMADol (ULTRAM) 50 MG tablet Take by mouth every 8 (eight) hours.    . triamcinolone ointment (KENALOG) 0.1 %     . Vitamin D, Cholecalciferol, 400 UNITS  CAPS Take 1 capsule by mouth.    . Wound Dressings (ATRAPRO HYDROGEL) GEL Apply topically.    Marland Kitchen zolpidem (AMBIEN) 5 MG tablet TAKE 1 TABLET AT BEDTIME AS NEEDED FOR SLEEP 15 tablet 0  . OXcarbazepine (TRILEPTAL) 150 MG tablet Take 1 tab QHS for 5 days then take 2 tabs QHS for 5 days then take 1 tab in AM and 2 tabs QHS for 5 days then take 2 tabs in AM and 2 tabs QHS for 5 days then take 2 tabs in AM, 1 tab at lunch and 2 tabs QHS for 5 days then take 2 tabs in AM, 2 tabs at lunch and 2 tabs QHS and continue.     No current facility-administered medications on file prior to visit.     Past Surgical History  Procedure Laterality Date  . Laparoscopic cholecystectomy  07-18-2002  . Vaginal hysterectomy  12-19-2010  dr Ambrose Mantle    w/ Anterior and Posterior Repair and Cystourethropexy with Transobturator Sling  . Gynecologic cryosurgery  age 56  . Tubal ligation  05-04-2001    postpartum  . Cysto with hydrodistension N/A 11/09/2014    Procedure: CYSTOSCOPY/HYDRODISTENSION/INSTILLATION OF MARCAINE AND PYRIDUM;  Surgeon: Alfredo Martinez, MD;  Location[facility];  Service: Urology;  Laterality: N/A;      Review of Systems  Constitutional: Negative for fever and chills.  Respiratory: Negative for chest tightness and shortness of breath.   Cardiovascular: Positive for chest pain. Negative for palpitations and leg swelling.  Gastrointestinal: Positive for diarrhea.      Objective:    BP 110/80 mmHg  Pulse 93  Temp(Src) 98.5 F (36.9 C) (Oral)  Resp 16  Ht  (1.676 m)  Wt 213 lb (96.616 kg)  BMI 34.40 kg/m2  SpO2 95%  LMP 11/12/2010 Nursing note and vital signs reviewed.  Physical Exam  Constitutional: She is oriented to person, place, and time. She appears well-developed and well-nourished. No distress.  Cardiovascular: Normal rate, regular rhythm, normal heart sounds and intact distal pulses.   Pulmonary/Chest: Effort normal and breath sounds normal.    Neurological: She is alert and oriented to person, place, and time.  Skin: Skin is warm and dry.  Psychiatric: She has a normal mood and affect. Her behavior is normal. Judgment and thought content normal.       Assessment & Plan:   Problem List Items Addressed This Visit      Other   Generalized anxiety disorder - Primary    Symptoms consistent with an exacerbation of generalized anxiety with concern for developing serotonin syndrome secondary to medication for anxiety and fluctuations in medications recently. Recommend follow up care with psychiatry to stabilize medications and reduce risk for serotonin syndrome. If her personal psychiatrist is unavailable I  recommend follow up in the ED to be assessed by psychiatry and stabilized.           Note: Greater than 35 minutes was spent in face-to-face patient contact discussing her anxiety, medications, and the potential risk for serotonin syndrome with the need for stabilization of her medications.

## 2015-08-13 NOTE — ED Notes (Signed)
Pt from home after being referred to ED by her PCP. Pt has been experiencing insomnia, anxiety, anorexia, diarrhea, diaphoresis. Pt went to PCP and was told that she may be experiencing serotonin syndrome. Pt meds have been changed recently by pt and may have caused this reaction. Pt is A&O and in NAD. Pt is tearful and anxious.

## 2015-08-13 NOTE — Assessment & Plan Note (Signed)
Symptoms consistent with an exacerbation of generalized anxiety with concern for developing serotonin syndrome secondary to medication for anxiety and fluctuations in medications recently. Recommend follow up care with psychiatry to stabilize medications and reduce risk for serotonin syndrome. If her personal psychiatrist is unavailable I recommend follow up in the ED to be assessed by psychiatry and stabilized.

## 2015-08-24 ENCOUNTER — Other Ambulatory Visit: Payer: Self-pay | Admitting: Family

## 2015-08-25 DIAGNOSIS — M792 Neuralgia and neuritis, unspecified: Secondary | ICD-10-CM | POA: Insufficient documentation

## 2015-08-26 ENCOUNTER — Telehealth: Payer: Self-pay | Admitting: Family

## 2015-08-26 ENCOUNTER — Encounter: Payer: Self-pay | Admitting: Family Medicine

## 2015-08-26 ENCOUNTER — Ambulatory Visit (INDEPENDENT_AMBULATORY_CARE_PROVIDER_SITE_OTHER): Payer: Medicare Other | Admitting: Family Medicine

## 2015-08-26 VITALS — BP 102/60 | HR 89 | Temp 98.3°F | Wt 217.7 lb

## 2015-08-26 DIAGNOSIS — R197 Diarrhea, unspecified: Secondary | ICD-10-CM | POA: Diagnosis not present

## 2015-08-26 DIAGNOSIS — R110 Nausea: Secondary | ICD-10-CM

## 2015-08-26 MED ORDER — DIPHENOXYLATE-ATROPINE 2.5-0.025 MG PO TABS: 1.0000 | ORAL_TABLET | Freq: Four times a day (QID) | ORAL | Status: DC | PRN

## 2015-08-26 NOTE — Patient Instructions (Signed)
Please make an appointment with your Primary care provider for a hospital follow up and review of current medication regimen. Collect specimen cup from lab and you may return specimen to [facility] once collected and results will called to you. If diarrhea continues or you develop a fever >101, please contact clinic for further evalaution  Diarrhea Diarrhea is frequent loose and watery bowel movements. It can cause you to feel weak and dehydrated. Dehydration can cause you to become tired and thirsty, have a dry mouth, and have decreased urination that often is dark yellow. Diarrhea is a sign of another problem, most often an infection that will not last long. In most cases, diarrhea typically lasts 2-3 days. However, it can last longer if it is a sign of something more serious. It is important to treat your diarrhea as directed by your caregiver to lessen or prevent future episodes of diarrhea. CAUSES  Some common causes include:  Gastrointestinal infections caused by viruses, bacteria, or parasites.  Food poisoning or food allergies.  Certain medicines, such as antibiotics, chemotherapy, and laxatives.  Artificial sweeteners and fructose.  Digestive disorders. HOME CARE INSTRUCTIONS  Ensure adequate fluid intake (hydration): Have 1 cup (8 oz) of fluid for each diarrhea episode. Avoid fluids that contain simple sugars or sports drinks, fruit juices, whole milk products, and sodas. Your urine should be clear or pale yellow if you are drinking enough fluids. Hydrate with an oral rehydration solution that you can purchase at pharmacies, retail stores, and online. You can prepare an oral rehydration solution at home by mixing the following ingredients together:   - tsp table salt.   tsp baking soda.   tsp salt substitute containing potassium chloride.  1  tablespoons sugar.  1 L (34 oz) of water.  Certain foods and beverages may increase the speed at which food moves through the  gastrointestinal (GI) tract. These foods and beverages should be avoided and include:  Caffeinated and alcoholic beverages.  High-fiber foods, such as raw fruits and vegetables, nuts, seeds, and whole grain breads and cereals.  Foods and beverages sweetened with sugar alcohols, such as xylitol, sorbitol, and mannitol.  Some foods may be well tolerated and may help thicken stool including:  Starchy foods, such as rice, toast, pasta, low-sugar cereal, oatmeal, grits, baked potatoes, crackers, and bagels.  Bananas.  Applesauce.  Add probiotic-rich foods to help increase healthy bacteria in the GI tract, such as yogurt and fermented milk products.  Wash your hands well after each diarrhea episode.  Only take over-the-counter or prescription medicines as directed by your caregiver.  Take a warm bath to relieve any burning or pain from frequent diarrhea episodes. SEEK IMMEDIATE MEDICAL CARE IF:   You are unable to keep fluids down.  You have persistent vomiting.  You have blood in your stool, or your stools are black and tarry.  You do not urinate in 6-8 hours, or there is only a small amount of very dark urine.  You have abdominal pain that increases or localizes.  You have weakness, dizziness, confusion, or light-headedness.  You have a severe headache.  Your diarrhea gets worse or does not get better.  You have a fever or persistent symptoms for more than 2-3 days.  You have a fever and your symptoms suddenly get worse. MAKE SURE YOU:   Understand these instructions.  Will watch your condition.  Will get help right away if you are not doing well or get worse.   This information  is not intended to replace advice given to you by your health care provider. Make sure you discuss any questions you have with your health care provider.   Document Released: 06/02/2002 Document Revised: 07/03/2014 Document Reviewed: 02/18/2012 Elsevier Interactive Patient Education [employer].

## 2015-08-26 NOTE — Telephone Encounter (Signed)
Sent pt a my chart message with Greg's response.

## 2015-08-26 NOTE — Progress Notes (Signed)
Subjective:    Patient ID: [name], female    DOB: 09/25/1970, 45 y.o.   MRN: [id number]  HPI  [name] is a 45 year old female who presents today with loose stools that started on 08/08/15 after stopping her tramadol TID dose immediately. She stated that she read on the internet that serotonin syndrome can occur with this medication and became fearful and stopped it abruptly. Her problem list includes pelvic pain that is treated by a pain clinic after removal of a bladder sling in the summer of 2016 per patient. Other pertinent diagnoses includes IBS, OCD, psychosomatic disorder, total body pain, and generalized anxiety disorder. She is followed by a PCP, psychiatrist, and pain clinic specialist for these issues. On 08/13/15 she was seen in the ED and diagnosed with nausea and drug interaction following a ketamine treatment in the pain clinic. Further exacerbating issues for this symptom include discontinuing Cymbalta and restarting Lexapro.  Today, she states that she has had 6 episodes of loose stools. She denies frank watery or bloody stools, fever, chills, sweats, unexplained weight loss, fatigue, decreased urination, lightheadedness, recent antibiotic use, or suspicious food trigger.  Associated symptom of nausea and decreased appetite is present. Treatment at home includes OTC imodium which has provided minimal benefit. Diet today consists of Gatorade and toast.   Review of Systems  Constitutional: Positive for appetite change. Negative for fever, chills, fatigue and unexpected weight change.  Respiratory: Negative for cough, chest tightness and shortness of breath.   Cardiovascular: Negative for chest pain, palpitations and leg swelling.  Gastrointestinal: Positive for nausea and diarrhea. Negative for vomiting and blood in stool.       Abdominal cramping noted with episodes of loose stools  Genitourinary: Positive for pelvic pain. Negative for dysuria, urgency, hematuria and flank pain.        History of pelvic pain which is treated at a chronic pain center  Skin: Negative for pallor and rash.  Neurological: Negative for dizziness, light-headedness and headaches.  Psychiatric/Behavioral: Negative for sleep disturbance.       History of OCD and psychosomatic disorder.        Objective:   Physical Exam  Constitutional: She is oriented to person, place, and time. She appears well-developed and well-nourished.  HENT:  Mouth/Throat: Mucous membranes are normal.  Cardiovascular: Normal rate and regular rhythm.   Pulmonary/Chest: Effort normal and breath sounds normal. She has no wheezes. She has no rales.  Abdominal: Soft. Bowel sounds are normal. She exhibits no distension. There is no rebound.  Mild generalized tenderness noted upon palpation by patient. No guarding, rebound, or signs of acute abdomen.   Lymphadenopathy:    She has no cervical adenopathy.  Neurological: She is alert and oriented to person, place, and time.  Skin: Skin is warm and dry. No rash noted.  Psychiatric: Her speech is normal. Her mood appears anxious.  Patient concerned that her symptom of diarrhea may be "dangerous" and she came to clinic for evaluation.      Assessment & Plan:  1. Diarrhea, unspecified type Suspect symptom of diarrhea is related to abruptly stopping tramadol on 08/08/15 or other medication interaction due to treatment with ketamine on 08/09/15 and abruptly stopping cymbalta on 08/12/15. Screen for C-diff due to recent visit to the hospital. Patient and mother were concerned and anxious that this could be a possibility. Advised patient to increase fluids, slowly progress diet starting with bland foods as tolerated.  - C. difficile, PCR; Future -  diphenoxylate-atropine (LOMOTIL) 2.5-0.025 MG tablet; Take 1 tablet by mouth 4 (four) times daily as needed for diarrhea or loose stools.  Dispense: 30 tablet; Refill: 0 - C. difficile, PCR  2. Nausea without vomiting Increase fluids as  tolerated and progress diet as tolerated.  Strongly advised patient and her mother to follow up with her PCP for further evaluation and completion of a post hospital visit. Further advised that she follow up with her psychiatrist to review current medication regimen and develop a plan for adherence. Discussed with patient the risks associated with abruptly stopping medications. Encouraged her to seek advice from her PCP for a plan to discontinue medication if needed in the future. Patient and mother voiced understanding and agreed with plan.

## 2015-08-26 NOTE — Telephone Encounter (Signed)
Pt was in to see you a couple weeks ago and you sent her to the ED. She was taken off cymbalta and she is still having the diarrhea. She's wondering if it is normal to have this diarrhea for so long after coming off of the medication  Please advise

## 2015-08-26 NOTE — Progress Notes (Signed)
Pre visit review using our clinic review tool, if applicable. No additional management support is needed unless otherwise documented below in the visit note. 

## 2015-08-26 NOTE — Telephone Encounter (Signed)
Patient Name: [name]  DOB: 01/29/1971    Initial Comment Caller states she has had severe diarrhea for two weeks. Everything she eats go right through her and she is very concerned.    Nurse Assessment  Nurse: Stefano Gaul, RN, Dwana Curd Date/Time (Eastern Time): 08/26/2015 1:49:46 PM  Confirm and document reason for call. If symptomatic, describe symptoms. You must click the next button to save text entered. ---Caller states she has had diarrhea for 2 weeks. Anything she eats, she gets diarrhea soon afterwards. No vomiting. Does not think she has a fever. She has had some nausea. She has stopped taking tramadol and cymbalta. she is having 6 stools daily. Stools are [place] and watery. She has urinated. Blood sugar 106.  Has the patient traveled out of the country within the last 30 days? ---No  Does the patient have any new or worsening symptoms? ---Yes  Will a triage be completed? ---Yes  Related visit to physician within the last 2 weeks? ---No  Does the PT have any chronic conditions? (i.e. diabetes, asthma, etc.) ---Yes  List chronic conditions. ---anxiety , diabetes  Is the patient pregnant or possibly pregnant? (Ask all females between the ages of 18-55) ---No  Is this a behavioral health or substance abuse call? ---No     Guidelines    Guideline Title Affirmed Question Affirmed Notes  Diarrhea [1] MODERATE diarrhea (e.g., 4-6 times / day more than normal) AND [2] present > 48 hours (2 days)    Final Disposition User   See Physician within 24 Hours [place], RN, Dwana Curd    Comments  appt scheduled for 08/26/15 at [facility] at 4 pm with [employer].   Referrals  REFERRED TO PCP OFFICE   Disagree/Comply: Comply

## 2015-08-30 ENCOUNTER — Other Ambulatory Visit: Payer: Self-pay | Admitting: Family

## 2015-08-31 LAB — CLOSTRIDIUM DIFFICILE BY PCR

## 2015-09-08 ENCOUNTER — Telehealth: Payer: Self-pay | Admitting: Neurology

## 2015-09-08 NOTE — Telephone Encounter (Signed)
PT called in regards to appointment and wanted to know about chronic pain and if she should continue to see Dr Pete PeltAquino/Dawn [phone]

## 2015-09-08 NOTE — Telephone Encounter (Signed)
Patient returned my call. She wanted to know if Dr. Karel JarvisAquino treated fibromyalgia. I did tell her that Dr. Karel JarvisAquino didn't treat fibromyalgia, she states that she has been dx. I did tell her that she should be seeing a Rheumatologist and the doctor that worked her up for her sxs should do the referral because Dr. Karel JarvisAquino saw her for something different.

## 2015-09-08 NOTE — Telephone Encounter (Signed)
Lmovm to rtn my call. 

## 2015-09-13 ENCOUNTER — Other Ambulatory Visit: Payer: Self-pay | Admitting: Family

## 2015-09-24 ENCOUNTER — Ambulatory Visit: Payer: Medicare Other | Admitting: Neurology

## 2015-09-27 ENCOUNTER — Encounter: Payer: Self-pay | Admitting: Family Medicine

## 2015-09-27 ENCOUNTER — Ambulatory Visit (INDEPENDENT_AMBULATORY_CARE_PROVIDER_SITE_OTHER): Payer: Medicare Other | Admitting: Family Medicine

## 2015-09-27 VITALS — BP 102/80 | HR 92 | Temp 99.0°F | Ht 64.57 in | Wt 219.7 lb

## 2015-09-27 DIAGNOSIS — E1169 Type 2 diabetes mellitus with other specified complication: Secondary | ICD-10-CM | POA: Diagnosis not present

## 2015-09-27 DIAGNOSIS — Z720 Tobacco use: Secondary | ICD-10-CM | POA: Diagnosis not present

## 2015-09-27 DIAGNOSIS — R300 Dysuria: Secondary | ICD-10-CM

## 2015-09-27 DIAGNOSIS — R52 Pain, unspecified: Secondary | ICD-10-CM

## 2015-09-27 DIAGNOSIS — Z716 Tobacco abuse counseling: Secondary | ICD-10-CM

## 2015-09-27 LAB — POC URINALSYSI DIPSTICK (AUTOMATED)
BILIRUBIN UA: NEGATIVE
Glucose, UA: NEGATIVE
Ketones, UA: NEGATIVE
Leukocytes, UA: NEGATIVE
NITRITE UA: NEGATIVE
PH UA: 7
Protein, UA: NEGATIVE
RBC UA: NEGATIVE
SPEC GRAV UA: 1.01
Urobilinogen, UA: 0.2

## 2015-09-27 LAB — POCT GLYCOSYLATED HEMOGLOBIN (HGB A1C): Hemoglobin A1C: 6.4

## 2015-09-27 NOTE — Addendum Note (Signed)
Addended by: Griselda MinerJIMENEZ, Redell Bhandari E on: 09/27/2015 09:55 AM   Modules accepted: Orders

## 2015-09-27 NOTE — Patient Instructions (Signed)
Please return for physical in August 2017 or sooner if needed for acute concerns. Please keep appointments with urology, psychiatry, and pain clinic for management of chronic conditions.      Why follow it? Research shows. . Those who follow the Mediterranean diet have a reduced risk of heart disease  . The diet is associated with a reduced incidence of Parkinson's and Alzheimer's diseases . People following the diet may have longer life expectancies and lower rates of chronic diseases  . The Dietary Guidelines for Americans recommends the Mediterranean diet as an eating plan to promote health and prevent disease  What Is the Mediterranean Diet?  . Healthy eating plan based on typical foods and recipes of Mediterranean-style cooking . The diet is primarily a plant based diet; these foods should make up a majority of meals   Starches - Plant based foods should make up a majority of meals - They are an important sources of vitamins, minerals, energy, antioxidants, and fiber - Choose whole grains, foods high in fiber and minimally processed items  - Typical grain sources include wheat, oats, barley, corn, [place] rice, bulgar, farro, millet, polenta, couscous  - Various types of beans include chickpeas, lentils, fava beans, black beans, white beans   Fruits  Veggies - Large quantities of antioxidant rich fruits & veggies; 6 or more servings  - Vegetables can be eaten raw or lightly drizzled with oil and cooked  - Vegetables common to the traditional Mediterranean Diet include: artichokes, arugula, beets, broccoli, brussel sprouts, cabbage, carrots, celery, collard greens, cucumbers, eggplant, kale, leeks, lemons, lettuce, mushrooms, okra, onions, peas, peppers, potatoes, pumpkin, radishes, rutabaga, shallots, spinach, sweet potatoes, turnips, zucchini - Fruits common to the Mediterranean Diet include: apples, apricots, avocados, cherries, clementines, dates, figs, grapefruits, grapes, melons,  nectarines, oranges, peaches, pears, pomegranates, strawberries, tangerines  Fats - Replace butter and margarine with healthy oils, such as olive oil, canola oil, and tahini  - Limit nuts to no more than a handful a day  - Nuts include walnuts, almonds, pecans, pistachios, pine nuts  - Limit or avoid candied, honey roasted or heavily salted nuts - Olives are central to the [employer] - can be eaten whole or used in a variety of dishes   Meats Protein - Limiting red meat: no more than a few times a month - When eating red meat: choose lean cuts and keep the portion to the size of deck of cards - Eggs: approx. 0 to 4 times a week  - Fish and lean poultry: at least 2 a week  - Healthy protein sources include, chicken, [place], lean beef, lamb - Increase intake of seafood such as tuna, salmon, trout, mackerel, shrimp, scallops - Avoid or limit high fat processed meats such as sausage and bacon  Dairy - Include moderate amounts of low fat dairy products  - Focus on healthy dairy such as fat free yogurt, skim milk, low or reduced fat cheese - Limit dairy products higher in fat such as whole or 2% milk, cheese, ice cream  Alcohol - Moderate amounts of red wine is ok  - No more than 5 oz daily for women (all ages) and men older than age 43  - No more than 10 oz of wine daily for men younger than 73  Other - Limit sweets and other desserts  - Use herbs and spices instead of salt to flavor foods  - Herbs and spices common to the traditional Mediterranean Diet include: basil, bay leaves,  chives, cloves, cumin, fennel, garlic, lavender, marjoram, mint, oregano, parsley, pepper, rosemary, sage, savory, sumac, tarragon, thyme   It's not just a diet, it's a lifestyle:  . The Mediterranean diet includes lifestyle factors typical of those in the region  . Foods, drinks and meals are best eaten with others and savored . Daily physical activity is important for overall good health . This could be  strenuous exercise like running and aerobics . This could also be more leisurely activities such as walking, housework, yard-work, or taking the stairs . Moderation is the key; a balanced and healthy diet accommodates most foods and drinks . Consider portion sizes and frequency of consumption of certain foods   Meal Ideas & Options:  . Breakfast:  o Whole wheat toast or whole wheat English muffins with peanut butter & hard boiled egg o Steel cut oats topped with apples & cinnamon and skim milk  o Fresh fruit: banana, strawberries, melon, berries, peaches  o Smoothies: strawberries, bananas, greek yogurt, peanut butter o Low fat greek yogurt with blueberries and granola  o Egg white omelet with spinach and mushrooms o Breakfast couscous: whole wheat couscous, apricots, skim milk, cranberries  . Sandwiches:  o Hummus and grilled vegetables (peppers, zucchini, squash) on whole wheat bread   o Grilled chicken on whole wheat pita with lettuce, tomatoes, cucumbers or tzatziki  o Tuna salad on whole wheat bread: tuna salad made with greek yogurt, olives, red peppers, capers, green onions o Garlic rosemary lamb pita: lamb sauted with garlic, rosemary, salt & pepper; add lettuce, cucumber, greek yogurt to pita - flavor with lemon juice and black pepper  . Seafood:  o Mediterranean grilled salmon, seasoned with garlic, basil, parsley, lemon juice and black pepper o Shrimp, lemon, and spinach whole-grain pasta salad made with low fat greek yogurt  o Seared scallops with lemon orzo  o Seared tuna steaks seasoned salt, pepper, coriander topped with tomato mixture of olives, tomatoes, olive oil, minced garlic, parsley, green onions and cappers  . Meats:  o Herbed greek chicken salad with kalamata olives, cucumber, feta  o Red bell peppers stuffed with spinach, bulgur, lean ground beef (or lentils) & topped with feta   o Kebabs: skewers of chicken, tomatoes, onions, zucchini, squash  o [place] burgers:  made with red onions, mint, dill, lemon juice, feta cheese topped with roasted red peppers . Vegetarian o Cucumber salad: cucumbers, artichoke hearts, celery, red onion, feta cheese, tossed in olive oil & lemon juice  o Hummus and whole grain pita points with a greek salad (lettuce, tomato, feta, olives, cucumbers, red onion) o Lentil soup with celery, carrots made with vegetable broth, garlic, salt and pepper  o Tabouli salad: parsley, bulgur, mint, scallions, cucumbers, tomato, radishes, lemon juice, olive oil, salt and pepper.

## 2015-09-27 NOTE — Progress Notes (Signed)
Pre visit review using our clinic review tool, if applicable. No additional management support is needed unless otherwise documented below in the visit note. 
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
## 2015-09-30 ENCOUNTER — Encounter: Payer: Self-pay | Admitting: Family Medicine

## 2015-10-01 ENCOUNTER — Other Ambulatory Visit: Payer: Self-pay

## 2015-10-01 DIAGNOSIS — E1140 Type 2 diabetes mellitus with diabetic neuropathy, unspecified: Secondary | ICD-10-CM

## 2015-10-01 MED ORDER — GLUCOSE BLOOD VI STRP: ORAL_STRIP | Status: DC

## 2015-10-01 MED ORDER — FREESTYLE LANCETS MISC
Status: DC
Start: 2015-10-01 — End: 2015-10-04

## 2015-10-01 MED ORDER — FREESTYLE LITE DEVI: Status: DC

## 2015-10-04 ENCOUNTER — Other Ambulatory Visit: Payer: Self-pay | Admitting: Family Medicine

## 2015-10-04 DIAGNOSIS — E1140 Type 2 diabetes mellitus with diabetic neuropathy, unspecified: Secondary | ICD-10-CM

## 2015-10-04 MED ORDER — FREESTYLE LANCETS MISC: Status: DC

## 2015-10-04 MED ORDER — GLUCOSE BLOOD VI STRP: ORAL_STRIP | Status: DC

## 2015-10-04 MED ORDER — FREESTYLE LANCETS MISC
Status: DC
Start: 2015-10-04 — End: 2016-05-26

## 2015-10-19 ENCOUNTER — Encounter: Payer: Self-pay | Admitting: Family Medicine

## 2015-10-19 ENCOUNTER — Other Ambulatory Visit: Payer: Self-pay | Admitting: *Deleted

## 2015-10-19 DIAGNOSIS — E1140 Type 2 diabetes mellitus with diabetic neuropathy, unspecified: Secondary | ICD-10-CM

## 2015-10-19 MED ORDER — GLUCOSE BLOOD VI STRP: ORAL_STRIP | Status: DC

## 2015-12-17 ENCOUNTER — Encounter: Payer: Self-pay | Admitting: Family Medicine

## 2015-12-21 ENCOUNTER — Telehealth: Payer: Self-pay | Admitting: Family Medicine

## 2015-12-21 ENCOUNTER — Encounter: Payer: Self-pay | Admitting: Family Medicine

## 2015-12-21 ENCOUNTER — Ambulatory Visit (INDEPENDENT_AMBULATORY_CARE_PROVIDER_SITE_OTHER): Payer: Medicare Other | Admitting: Family Medicine

## 2015-12-21 VITALS — BP 116/80 | HR 88 | Temp 98.3°F | Ht 64.57 in | Wt 205.4 lb

## 2015-12-21 DIAGNOSIS — R5383 Other fatigue: Secondary | ICD-10-CM | POA: Diagnosis not present

## 2015-12-21 DIAGNOSIS — N390 Urinary tract infection, site not specified: Secondary | ICD-10-CM

## 2015-12-21 DIAGNOSIS — R110 Nausea: Secondary | ICD-10-CM | POA: Diagnosis not present

## 2015-12-21 DIAGNOSIS — R197 Diarrhea, unspecified: Secondary | ICD-10-CM

## 2015-12-21 LAB — HEMOGLOBIN AND HEMATOCRIT, BLOOD
HCT: 39.5 % (ref 36.0–46.0)
HEMOGLOBIN: 13.1 g/dL (ref 12.0–15.0)

## 2015-12-21 NOTE — Progress Notes (Signed)
Pre visit review using our clinic review tool, if applicable. No additional management support is needed unless otherwise documented below in the visit note. 

## 2015-12-21 NOTE — Patient Instructions (Signed)
BEFORE YOU LEAVE: -follow UJ:[id number]:Schedule follow up in 1 week -lab  Please check with your urologist to see if you can stop the cipro or take a different antibiotic  Seek care sooner if worsening or other concerns  Cut back on caffeine

## 2015-12-21 NOTE — Telephone Encounter (Signed)
Pt on schedule today to see Dr. Selena BattenKim.

## 2015-12-21 NOTE — Telephone Encounter (Signed)
Patient was seen today by Dr.Kim.

## 2015-12-21 NOTE — Telephone Encounter (Signed)
Patient Name: Crystal CritchleyNISSA Pompey  DOB: 01/30/1971    Initial Comment Caller had surgery on the 16th, her heart been racing and doing flip flops, taking Cipro for UTI   Nurse Assessment  Nurse: Scarlette ArStandifer, RN, Herbert SetaHeather Date/Time (Eastern Time): 12/21/2015 8:42:15 AM  Confirm and document reason for call. If symptomatic, describe symptoms. You must click the next button to save text entered. ---Caller had surgery on the 16th, her heart been racing and doing flip flops, it does it more at night, taking Cipro for UTI since Friday, Her heart started doing that before she started the cipro,  Has the patient traveled out of the country within the last 30 days? ---Not Applicable  Does the patient have any new or worsening symptoms? ---Yes  Will a triage be completed? ---Yes  Related visit to physician within the last 2 weeks? ---No  Does the PT have any chronic conditions? (i.e. diabetes, asthma, etc.) ---Yes  List chronic conditions. ---see MR  Is the patient pregnant or possibly pregnant? (Ask all females between the ages of 10812-55) ---No  Is this a behavioral health or substance abuse call? ---No     Guidelines    Guideline Title Affirmed Question Affirmed Notes  Heart Rate and Heartbeat Questions [1] Palpitations AND [2] no improvement after following Care Advice    Final Disposition User   See Physician within 4 Hours (or PCP triage) Scarlette ArStandifer, RN, Heather    Comments  Appt with Dr. Selena BattenKim at 1:45 pm today.   Referrals  REFERRED TO PCP OFFICE   Disagree/Comply: Comply
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
## 2015-12-27 ENCOUNTER — Ambulatory Visit (INDEPENDENT_AMBULATORY_CARE_PROVIDER_SITE_OTHER): Payer: Medicare Other | Admitting: Family Medicine

## 2015-12-27 ENCOUNTER — Encounter: Payer: Self-pay | Admitting: Family Medicine

## 2015-12-27 VITALS — BP 110/68 | HR 90 | Temp 98.5°F | Ht 64.5 in | Wt 208.1 lb

## 2015-12-27 DIAGNOSIS — Z720 Tobacco use: Secondary | ICD-10-CM

## 2015-12-27 DIAGNOSIS — F411 Generalized anxiety disorder: Secondary | ICD-10-CM

## 2015-12-27 DIAGNOSIS — R102 Pelvic and perineal pain: Secondary | ICD-10-CM | POA: Diagnosis not present

## 2015-12-27 LAB — POCT URINALYSIS DIPSTICK
Glucose, UA: NEGATIVE
KETONES UA: NEGATIVE
Nitrite, UA: NEGATIVE
PH UA: 6
Urobilinogen, UA: 0.2

## 2015-12-27 NOTE — Progress Notes (Signed)
Pre visit review using our clinic review tool, if applicable. No additional management support is needed unless otherwise documented below in the visit note. 
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
## 2015-12-27 NOTE — Patient Instructions (Signed)
Please consider counseling as we discussed. Also, if symptoms persist or worsen with palpitations after you continue with decreasing caffeine intake, further evaluation can be completed. Consider smoking cessation as we discussed.  Smoking Cessation, Tips for Success If you are ready to quit smoking, congratulations! You have chosen to help yourself be healthier. Cigarettes bring nicotine, tar, carbon monoxide, and other irritants into your body. Your lungs, heart, and blood vessels will be able to work better without these poisons. There are many different ways to quit smoking. Nicotine gum, nicotine patches, a nicotine inhaler, or nicotine nasal spray can help with physical craving. Hypnosis, support groups, and medicines help break the habit of smoking. WHAT THINGS CAN I DO TO MAKE QUITTING EASIER?  Here are some tips to help you quit for good:  Pick a date when you will quit smoking completely. Tell all of your friends and family about your plan to quit on that date.  Do not try to slowly cut down on the number of cigarettes you are smoking. Pick a quit date and quit smoking completely starting on that day.  Throw away all cigarettes.   Clean and remove all ashtrays from your home, work, and car.  On a card, write down your reasons for quitting. Carry the card with you and read it when you get the urge to smoke.  Cleanse your body of nicotine. Drink enough water and fluids to keep your urine clear or pale yellow. Do this after quitting to flush the nicotine from your body.  Learn to predict your moods. Do not let a bad situation be your excuse to have a cigarette. Some situations in your life might tempt you into wanting a cigarette.  Never have "just one" cigarette. It leads to wanting another and another. Remind yourself of your decision to quit.  Change habits associated with smoking. If you smoked while driving or when feeling stressed, try other activities to replace smoking. Stand  up when drinking your coffee. Brush your teeth after eating. Sit in a different chair when you read the paper. Avoid alcohol while trying to quit, and try to drink fewer caffeinated beverages. Alcohol and caffeine may urge you to smoke.  Avoid foods and drinks that can trigger a desire to smoke, such as sugary or spicy foods and alcohol.  Ask people who smoke not to smoke around you.  Have something planned to do right after eating or having a cup of coffee. For example, plan to take a walk or exercise.  Try a relaxation exercise to calm you down and decrease your stress. Remember, you may be tense and nervous for the first 2 weeks after you quit, but this will pass.  Find new activities to keep your hands busy. Play with a pen, coin, or rubber band. Doodle or draw things on paper.  Brush your teeth right after eating. This will help cut down on the craving for the taste of tobacco after meals. You can also try mouthwash.   Use oral substitutes in place of cigarettes. Try using lemon drops, carrots, cinnamon sticks, or chewing gum. Keep them handy so they are available when you have the urge to smoke.  When you have the urge to smoke, try deep breathing.  Designate your home as a nonsmoking area.  If you are a heavy smoker, ask your health care provider about a prescription for nicotine chewing gum. It can ease your withdrawal from nicotine.  Reward yourself. Set aside the cigarette money you  save and buy yourself something nice.  Look for support from others. Join a support group or smoking cessation program. Ask someone at home or at work to help you with your plan to quit smoking.  Always ask yourself, "Do I need this cigarette or is this just a reflex?" Tell yourself, "Today, I choose not to smoke," or "I do not want to smoke." You are reminding yourself of your decision to quit.  Do not replace cigarette smoking with electronic cigarettes (commonly called e-cigarettes). The safety  of e-cigarettes is unknown, and some may contain harmful chemicals.  If you relapse, do not give up! Plan ahead and think about what you will do the next time you get the urge to smoke. HOW WILL I FEEL WHEN I QUIT SMOKING? You may have symptoms of withdrawal because your body is used to nicotine (the addictive substance in cigarettes). You may crave cigarettes, be irritable, feel very hungry, cough often, get headaches, or have difficulty concentrating. The withdrawal symptoms are only temporary. They are strongest when you first quit but will go away within 10-14 days. When withdrawal symptoms occur, stay in control. Think about your reasons for quitting. Remind yourself that these are signs that your body is healing and getting used to being without cigarettes. Remember that withdrawal symptoms are easier to treat than the major diseases that smoking can cause.  Even after the withdrawal is over, expect periodic urges to smoke. However, these cravings are generally short lived and will go away whether you smoke or not. Do not smoke! WHAT RESOURCES ARE AVAILABLE TO HELP ME QUIT SMOKING? Your health care provider can direct you to community resources or hospitals for support, which may include:  Group support.  Education.  Hypnosis.  Therapy.   This information is not intended to replace advice given to you by your health care provider. Make sure you discuss any questions you have with your health care provider.   Document Released: 03/10/2004 Document Revised: 07/03/2014 Document Reviewed: 11/28/2012 Elsevier Interactive Patient Education [employer]2016 [employer]

## 2016-01-24 ENCOUNTER — Other Ambulatory Visit (INDEPENDENT_AMBULATORY_CARE_PROVIDER_SITE_OTHER): Payer: Medicare Other

## 2016-01-24 DIAGNOSIS — Z0000 Encounter for general adult medical examination without abnormal findings: Secondary | ICD-10-CM

## 2016-01-24 LAB — POC URINALSYSI DIPSTICK (AUTOMATED)
Bilirubin, UA: NEGATIVE
Glucose, UA: NEGATIVE
KETONES UA: NEGATIVE
NITRITE UA: NEGATIVE
PH UA: 6
PROTEIN UA: NEGATIVE
Spec Grav, UA: 1.015
Urobilinogen, UA: 0.2

## 2016-01-24 LAB — MICROALBUMIN / CREATININE URINE RATIO
CREATININE, U: 162.6 mg/dL
MICROALB/CREAT RATIO: 0.4 mg/g (ref 0.0–30.0)

## 2016-01-24 LAB — HEPATIC FUNCTION PANEL
ALBUMIN: 3.8 g/dL (ref 3.5–5.2)
ALT: 11 U/L (ref 0–35)
AST: 11 U/L (ref 0–37)
Alkaline Phosphatase: 71 U/L (ref 39–117)
Bilirubin, Direct: 0.1 mg/dL (ref 0.0–0.3)
Total Bilirubin: 0.4 mg/dL (ref 0.2–1.2)
Total Protein: 6.9 g/dL (ref 6.0–8.3)

## 2016-01-24 LAB — BASIC METABOLIC PANEL
BUN: 10 mg/dL (ref 6–23)
CHLORIDE: 101 meq/L (ref 96–112)
CO2: 31 meq/L (ref 19–32)
Calcium: 9.3 mg/dL (ref 8.4–10.5)
Creatinine, Ser: 0.65 mg/dL (ref 0.40–1.20)
GFR: 104.3 mL/min (ref >60.00)
GLUCOSE: 119 mg/dL — AB (ref 70–99)
POTASSIUM: 4.4 meq/L (ref 3.5–5.1)
Sodium: 140 mEq/L (ref 135–145)

## 2016-01-24 LAB — CBC WITH DIFFERENTIAL/PLATELET
BASOS ABS: 0 10*3/uL (ref 0.0–0.1)
Basophils Relative: 0.6 % (ref 0.0–3.0)
EOS ABS: 0.1 10*3/uL (ref 0.0–0.7)
Eosinophils Relative: 1.8 % (ref 0.0–5.0)
HCT: 37.5 % (ref 36.0–46.0)
Hemoglobin: 12.5 g/dL (ref 12.0–15.0)
LYMPHS ABS: 1.7 10*3/uL (ref 0.7–4.0)
Lymphocytes Relative: 22.8 % (ref 12.0–46.0)
MCHC: 33.4 g/dL (ref 30.0–36.0)
MCV: 89.1 fl (ref 78.0–100.0)
MONOS PCT: 6.6 % (ref 3.0–12.0)
Monocytes Absolute: 0.5 10*3/uL (ref 0.1–1.0)
NEUTROS PCT: 68.2 % (ref 43.0–77.0)
Neutro Abs: 5.1 10*3/uL (ref 1.4–7.7)
Platelets: 267 10*3/uL (ref 150.0–400.0)
RBC: 4.21 Mil/uL (ref 3.87–5.11)
RDW: 13.5 % (ref 11.5–15.5)
WBC: 7.5 10*3/uL (ref 4.0–10.5)

## 2016-01-24 LAB — HEMOGLOBIN A1C: HEMOGLOBIN A1C: 6.3 % (ref 4.6–6.5)

## 2016-01-24 LAB — LIPID PANEL
CHOLESTEROL: 143 mg/dL (ref 0–200)
HDL: 47.2 mg/dL (ref >39.00)
LDL CALC: 76 mg/dL (ref 0–99)
NonHDL: 95.9
Total CHOL/HDL Ratio: 3
Triglycerides: 99 mg/dL (ref 0.0–149.0)
VLDL: 19.8 mg/dL (ref 0.0–40.0)

## 2016-01-24 LAB — TSH: TSH: 0.66 u[IU]/mL (ref 0.35–4.50)

## 2016-01-31 ENCOUNTER — Encounter: Payer: Medicare Other | Admitting: Family Medicine

## 2016-02-07 ENCOUNTER — Encounter: Payer: Self-pay | Admitting: Family Medicine

## 2016-02-07 ENCOUNTER — Ambulatory Visit (INDEPENDENT_AMBULATORY_CARE_PROVIDER_SITE_OTHER): Payer: Medicare Other | Admitting: Family Medicine

## 2016-02-07 VITALS — BP 110/80 | HR 91 | Temp 98.0°F | Ht 63.5 in | Wt 208.7 lb

## 2016-02-07 DIAGNOSIS — Z0000 Encounter for general adult medical examination without abnormal findings: Secondary | ICD-10-CM | POA: Diagnosis not present
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
## 2016-02-07 NOTE — Patient Instructions (Signed)
Health Maintenance, Female Adopting a healthy lifestyle and getting preventive care can go a long way to promote health and wellness. Talk with your health care provider about what schedule of regular examinations is right for you. This is a good chance for you to check in with your provider about disease prevention and staying healthy. In between checkups, there are plenty of things you can do on your own. Experts have done a lot of research about which lifestyle changes and preventive measures are most likely to keep you healthy. Ask your health care provider for more information. WEIGHT AND DIET  Eat a healthy diet  Be sure to include plenty of vegetables, fruits, low-fat dairy products, and lean protein.  Do not eat a lot of foods high in solid fats, added sugars, or salt.  Get regular exercise. This is one of the most important things you can do for your health.  Most adults should exercise for at least 150 minutes each week. The exercise should increase your heart rate and make you sweat (moderate-intensity exercise).  Most adults should also do strengthening exercises at least twice a week. This is in addition to the moderate-intensity exercise.  Maintain a healthy weight  Body mass index (BMI) is a measurement that can be used to identify possible weight problems. It estimates body fat based on height and weight. Your health care provider can help determine your BMI and help you achieve or maintain a healthy weight.  For females 20 years of age and older:   A BMI below 18.5 is considered underweight.  A BMI of 18.5 to 24.9 is normal.  A BMI of 25 to 29.9 is considered overweight.  A BMI of 30 and above is considered obese.  Watch levels of cholesterol and blood lipids  You should start having your blood tested for lipids and cholesterol at 46 years of age, then have this test every 5 years.  You may need to have your cholesterol levels checked more often if:  Your lipid  or cholesterol levels are high.  You are older than 46 years of age.  You are at high risk for heart disease.  CANCER SCREENING   Lung Cancer  Lung cancer screening is recommended for adults 55-80 years old who are at high risk for lung cancer because of a history of smoking.  A yearly low-dose CT scan of the lungs is recommended for people who:  Currently smoke.  Have quit within the past 15 years.  Have at least a 30-pack-year history of smoking. A pack year is smoking an average of one pack of cigarettes a day for 1 year.  Yearly screening should continue until it has been 15 years since you quit.  Yearly screening should stop if you develop a health problem that would prevent you from having lung cancer treatment.  Breast Cancer  Practice breast self-awareness. This means understanding how your breasts normally appear and feel.  It also means doing regular breast self-exams. Let your health care provider know about any changes, no matter how small.  If you are in your 20s or 30s, you should have a clinical breast exam (CBE) by a health care provider every 1-3 years as part of a regular health exam.  If you are 40 or older, have a CBE every year. Also consider having a breast X-ray (mammogram) every year.  If you have a family history of breast cancer, talk to your health care provider about genetic screening.  If you   are at high risk for breast cancer, talk to your health care provider about having an MRI and a mammogram every year.  Breast cancer gene (BRCA) assessment is recommended for women who have family members with BRCA-related cancers. BRCA-related cancers include:  Breast.  Ovarian.  Tubal.  Peritoneal cancers.  Results of the assessment will determine the need for genetic counseling and BRCA1 and BRCA2 testing. Cervical Cancer Your health care provider may recommend that you be screened regularly for cancer of the pelvic organs (ovaries, uterus, and  vagina). This screening involves a pelvic examination, including checking for microscopic changes to the surface of your cervix (Pap test). You may be encouraged to have this screening done every 3 years, beginning at age 21.  For women ages 30-65, health care providers may recommend pelvic exams and Pap testing every 3 years, or they may recommend the Pap and pelvic exam, combined with testing for human papilloma virus (HPV), every 5 years. Some types of HPV increase your risk of cervical cancer. Testing for HPV may also be done on women of any age with unclear Pap test results.  Other health care providers may not recommend any screening for nonpregnant women who are considered low risk for pelvic cancer and who do not have symptoms. Ask your health care provider if a screening pelvic exam is right for you.  If you have had past treatment for cervical cancer or a condition that could lead to cancer, you need Pap tests and screening for cancer for at least 20 years after your treatment. If Pap tests have been discontinued, your risk factors (such as having a new sexual partner) need to be reassessed to determine if screening should resume. Some women have medical problems that increase the chance of getting cervical cancer. In these cases, your health care provider may recommend more frequent screening and Pap tests. Colorectal Cancer  This type of cancer can be detected and often prevented.  Routine colorectal cancer screening usually begins at 46 years of age and continues through 46 years of age.  Your health care provider may recommend screening at an earlier age if you have risk factors for colon cancer.  Your health care provider may also recommend using home test kits to check for hidden blood in the stool.  A small camera at the end of a tube can be used to examine your colon directly (sigmoidoscopy or colonoscopy). This is done to check for the earliest forms of colorectal  cancer.  Routine screening usually begins at age 50.  Direct examination of the colon should be repeated every 5-10 years through 46 years of age. However, you may need to be screened more often if early forms of precancerous polyps or small growths are found. Skin Cancer  Check your skin from head to toe regularly.  Tell your health care provider about any new moles or changes in moles, especially if there is a change in a mole's shape or color.  Also tell your health care provider if you have a mole that is larger than the size of a pencil eraser.  Always use sunscreen. Apply sunscreen liberally and repeatedly throughout the day.  Protect yourself by wearing long sleeves, pants, a wide-brimmed hat, and sunglasses whenever you are outside. HEART DISEASE, DIABETES, AND HIGH BLOOD PRESSURE   High blood pressure causes heart disease and increases the risk of stroke. High blood pressure is more likely to develop in:  People who have blood pressure in the high end   of the normal range (130-139/85-89 mm Hg).  People who are overweight or obese.  People who are African American.  If you are 38-23 years of age, have your blood pressure checked every 3-5 years. If you are 61 years of age or older, have your blood pressure checked every year. You should have your blood pressure measured twice--once when you are at a hospital or clinic, and once when you are not at a hospital or clinic. Record the average of the two measurements. To check your blood pressure when you are not at a hospital or clinic, you can use:  An automated blood pressure machine at a pharmacy.  A home blood pressure monitor.  If you are between 45 years and 39 years old, ask your health care provider if you should take aspirin to prevent strokes.  Have regular diabetes screenings. This involves taking a blood sample to check your fasting blood sugar level.  If you are at a normal weight and have a low risk for diabetes,  have this test once every three years after 46 years of age.  If you are overweight and have a high risk for diabetes, consider being tested at a younger age or more often. PREVENTING INFECTION  Hepatitis B  If you have a higher risk for hepatitis B, you should be screened for this virus. You are considered at high risk for hepatitis B if:  You were born in a country where hepatitis B is common. Ask your health care provider which countries are considered high risk.  Your parents were born in a high-risk country, and you have not been immunized against hepatitis B (hepatitis B vaccine).  You have HIV or AIDS.  You use needles to inject street drugs.  You live with someone who has hepatitis B.  You have had sex with someone who has hepatitis B.  You get hemodialysis treatment.  You take certain medicines for conditions, including cancer, organ transplantation, and autoimmune conditions. Hepatitis C  Blood testing is recommended for:  Everyone born from [birth year] through 1966.  Anyone with known risk factors for hepatitis C. Sexually transmitted infections (STIs)  You should be screened for sexually transmitted infections (STIs) including gonorrhea and chlamydia if:  You are sexually active and are younger than 46 years of age.  You are older than 46 years of age and your health care provider tells you that you are at risk for this type of infection.  Your sexual activity has changed since you were last screened and you are at an increased risk for chlamydia or gonorrhea. Ask your health care provider if you are at risk.  If you do not have HIV, but are at risk, it may be recommended that you take a prescription medicine daily to prevent HIV infection. This is called pre-exposure prophylaxis (PrEP). You are considered at risk if:  You are sexually active and do not regularly use condoms or know the HIV status of your partner(s).  You take drugs by injection.  You are sexually  active with a partner who has HIV. Talk with your health care provider about whether you are at high risk of being infected with HIV. If you choose to begin PrEP, you should first be tested for HIV. You should then be tested every 3 months for as long as you are taking PrEP.  PREGNANCY   If you are premenopausal and you may become pregnant, ask your health care provider about preconception counseling.  If you may  become pregnant, take 400 to 800 micrograms (mcg) of folic acid every day.  If you want to prevent pregnancy, talk to your health care provider about birth control (contraception). OSTEOPOROSIS AND MENOPAUSE   Osteoporosis is a disease in which the bones lose minerals and strength with aging. This can result in serious bone fractures. Your risk for osteoporosis can be identified using a bone density scan.  If you are 61 years of age or older, or if you are at risk for osteoporosis and fractures, ask your health care provider if you should be screened.  Ask your health care provider whether you should take a calcium or vitamin D supplement to lower your risk for osteoporosis.  Menopause may have certain physical symptoms and risks.  Hormone replacement therapy may reduce some of these symptoms and risks. Talk to your health care provider about whether hormone replacement therapy is right for you.  HOME CARE INSTRUCTIONS   Schedule regular health, dental, and eye exams.  Stay current with your immunizations.   Do not use any tobacco products including cigarettes, chewing tobacco, or electronic cigarettes.  If you are pregnant, do not drink alcohol.  If you are breastfeeding, limit how much and how often you drink alcohol.  Limit alcohol intake to no more than 1 drink per day for nonpregnant women. One drink equals 12 ounces of beer, 5 ounces of wine, or 1 ounces of hard liquor.  Do not use street drugs.  Do not share needles.  Ask your health care provider for help if  you need support or information about quitting drugs.  Tell your health care provider if you often feel depressed.  Tell your health care provider if you have ever been abused or do not feel safe at home.   This information is not intended to replace advice given to you by your health care provider. Make sure you discuss any questions you have with your health care provider.   Document Released: 12/26/2010 Document Revised: 07/03/2014 Document Reviewed: 05/14/2013 Elsevier Interactive Patient Education [employer].

## 2016-02-17 ENCOUNTER — Encounter: Payer: Self-pay | Admitting: Family Medicine

## 2016-02-22 ENCOUNTER — Other Ambulatory Visit: Payer: Self-pay | Admitting: Family

## 2016-02-25 ENCOUNTER — Other Ambulatory Visit: Payer: Self-pay | Admitting: Family

## 2016-02-25 ENCOUNTER — Other Ambulatory Visit: Payer: Self-pay | Admitting: Family Medicine

## 2016-03-01 NOTE — Telephone Encounter (Signed)
Pt requesting pravastatin originally filled by Veryl SpeakGregory D Calone FNP Last filled 08/31/15 # 90 refills: 1   Okay to refill??

## 2016-03-01 NOTE — Telephone Encounter (Signed)
Okay to refill pravastatin

## 2016-03-16 ENCOUNTER — Ambulatory Visit (INDEPENDENT_AMBULATORY_CARE_PROVIDER_SITE_OTHER): Payer: Medicare Other | Admitting: Family Medicine

## 2016-03-16 ENCOUNTER — Telehealth: Payer: Self-pay | Admitting: Family Medicine

## 2016-03-16 ENCOUNTER — Encounter: Payer: Self-pay | Admitting: Family Medicine

## 2016-03-16 VITALS — BP 96/62 | HR 88 | Temp 98.4°F | Wt 208.4 lb

## 2016-03-16 DIAGNOSIS — Z23 Encounter for immunization: Secondary | ICD-10-CM

## 2016-03-16 DIAGNOSIS — F411 Generalized anxiety disorder: Secondary | ICD-10-CM

## 2016-03-16 DIAGNOSIS — R350 Frequency of micturition: Secondary | ICD-10-CM | POA: Diagnosis not present

## 2016-03-16 DIAGNOSIS — R7309 Other abnormal glucose: Secondary | ICD-10-CM | POA: Diagnosis not present

## 2016-03-16 LAB — POCT URINALYSIS DIPSTICK
Bilirubin, UA: NEGATIVE
Blood, UA: NEGATIVE
GLUCOSE UA: NEGATIVE
KETONES UA: NEGATIVE
LEUKOCYTES UA: NEGATIVE
Nitrite, UA: NEGATIVE
Protein, UA: NEGATIVE
Urobilinogen, UA: 0.2
pH, UA: 5

## 2016-03-16 LAB — GLUCOSE, POCT (MANUAL RESULT ENTRY): POC Glucose: 91 mg/dl (ref 70–99)

## 2016-03-16 NOTE — Telephone Encounter (Signed)
Patient Name: Crystal CritchleyNISSA Nelson  DOB: 09/05/1970    Initial Comment Caller states she's a diabetic. Her sugar is running low. 70.   Nurse Assessment  Nurse: Sherilyn CooterHenry, RN, Thurmond ButtsWade Date/Time (Eastern Time): 03/16/2016 10:08:58 AM  Confirm and document reason for call. If symptomatic, describe symptoms. You must click the next button to save text entered. ---Caller states that she has been losing weight, and she is a diabetic. Her reading was 100 about 2-1/2 hours ago. She ate some cereal. She is not eating as much as she used to eat. She is a type II diabetic. She wants to know if she needs to be seen. She does not feel that eating is raising her blood sugar much. Her current reading is 165.  Has the patient traveled out of the country within the last 30 days? ---No  Does the patient have any new or worsening symptoms? ---Yes  Will a triage be completed? ---Yes  Related visit to physician within the last 2 weeks? ---No  Does the PT have any chronic conditions? (i.e. diabetes, asthma, etc.) ---Yes  List chronic conditions. ---Diabetes, Hypercholesterolemia, Chronic Pain, Interstitial Cystitis.  Is the patient pregnant or possibly pregnant? (Ask all females between the ages of 4712-55) ---No  Is this a behavioral health or substance abuse call? ---No     Guidelines    Guideline Title Affirmed Question Affirmed Notes  Diabetes - Low Blood Sugar Low blood sugar definition and treatment, questions about    Final Disposition User   Call PCP within 24 Hours Sherilyn CooterHenry, RN, Thurmond ButtsWade    Referrals  REFERRED TO PCP OFFICE   Disagree/Comply: [name]

## 2016-03-16 NOTE — Progress Notes (Signed)
Pre visit review using our clinic review tool, if applicable. No additional management support is needed unless otherwise documented below in the visit note. 
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
## 2016-03-16 NOTE — Patient Instructions (Signed)
Please continue medication as prescribed. Monitor your blood sugar twice a day and document for one week and send readings to be evaluated. Also, please eat small, frequent meals as we discussed.  Follow up in 3 months or sooner if needed after blood sugar averages have been reported.  Blood Glucose Monitoring, Adult Monitoring your blood glucose (also know as blood sugar) helps you to manage your diabetes. It also helps you and your health care provider monitor your diabetes and determine how well your treatment plan is working. WHY SHOULD YOU MONITOR YOUR BLOOD GLUCOSE?  It can help you understand how food, exercise, and medicine affect your blood glucose.  It allows you to know what your blood glucose is at any given moment. You can quickly tell if you are having low blood glucose (hypoglycemia) or high blood glucose (hyperglycemia).  It can help you and your health care provider know how to adjust your medicines.  It can help you understand how to manage an illness or adjust medicine for exercise. WHEN SHOULD YOU TEST? Your health care provider will help you decide how often you should check your blood glucose. This may depend on the type of diabetes you have, your diabetes control, or the types of medicines you are taking. Be sure to write down all of your blood glucose readings so that this information can be reviewed with your health care provider. See below for examples of testing times that your health care provider may suggest. Type 1 Diabetes  Test at least 2 times per day if your diabetes is well controlled, if you are using an insulin pump, or if you perform multiple daily injections.  If your diabetes is not well controlled or if you are sick, you may need to test more often.  It is a good idea to also test:  Before every insulin injection.  Before and after exercise.  Between meals and 2 hours after a meal.  Occasionally between 2:00 a.m. and 3:00 a.m. Type 2  Diabetes  If you are taking insulin, test at least 2 times per day. However, it is best to test before every insulin injection.  If you take medicines by mouth (orally), test 2 times a day.  If you are on a controlled diet, test once a day.  If your diabetes is not well controlled or if you are sick, you may need to monitor more often. HOW TO MONITOR YOUR BLOOD GLUCOSE Supplies Needed  Blood glucose meter.  Test strips for your meter. Each meter has its own strips. You must use the strips that go with your own meter.  A pricking needle (lancet).  A device that holds the lancet (lancing device).  A journal or log book to write down your results. Procedure  Wash your hands with soap and water. Alcohol is not preferred.  Prick the side of your finger (not the tip) with the lancet.  Gently milk the finger until a small drop of blood appears.  Follow the instructions that come with your meter for inserting the test strip, applying blood to the strip, and using your blood glucose meter. Other Areas to Get Blood for Testing Some meters allow you to use other areas of your body (other than your finger) to test your blood. These areas are called alternative sites. The most common alternative sites are:  The forearm.  The thigh.  The back area of the lower leg.  The palm of the hand. The blood flow in these areas  is slower. Therefore, the blood glucose values you get may be delayed, and the numbers are different from what you would get from your fingers. Do not use alternative sites if you think you are having hypoglycemia. Your reading will not be accurate. Always use a finger if you are having hypoglycemia. Also, if you cannot feel your lows (hypoglycemia unawareness), always use your fingers for your blood glucose checks. ADDITIONAL TIPS FOR GLUCOSE MONITORING  Do not reuse lancets.  Always carry your supplies with you.  All blood glucose meters have a 24-hour "hotline" number  to call if you have questions or need help.  Adjust (calibrate) your blood glucose meter with a control solution after finishing a few boxes of strips. BLOOD GLUCOSE RECORD KEEPING It is a good idea to keep a daily record or log of your blood glucose readings. Most glucose meters, if not all, keep your glucose records stored in the meter. Some meters come with the ability to download your records to your home computer. Keeping a record of your blood glucose readings is especially helpful if you are wanting to look for patterns. Make notes to go along with the blood glucose readings because you might forget what happened at that exact time. Keeping good records helps you and your health care provider to work together to achieve good diabetes management.    This information is not intended to replace advice given to you by your health care provider. Make sure you discuss any questions you have with your health care provider.   Document Released: 06/15/2003 Document Revised: 07/03/2014 Document Reviewed: 11/04/2012 Elsevier Interactive Patient Education [employer]

## 2016-03-16 NOTE — Telephone Encounter (Signed)
Pt is concerned about her sugar being low even after eating and taking her medication she would like to know it she should increase or decrease her medication.  Pt transferred to team health.

## 2016-03-16 NOTE — Telephone Encounter (Signed)
Appointment scheduled for today @ 1:30 with [name].

## 2016-03-19 ENCOUNTER — Telehealth: Payer: Medicare Other | Admitting: Family

## 2016-03-19 DIAGNOSIS — R519 Headache, unspecified: Secondary | ICD-10-CM

## 2016-03-19 DIAGNOSIS — R51 Headache: Secondary | ICD-10-CM

## 2016-03-19 NOTE — Progress Notes (Signed)
Based on what you shared with me it looks like you have a serious condition that should be evaluated in a face to face office visit.  If you are having a true medical emergency please call 911.  If you need an urgent face to face visit, [place] has four urgent care centers for your convenience.  If you need care fast and have a high deductible or no insurance consider:   [email or web address]  [phone]  [address], [address] [place], [place] [address] 8 am to 8 pm Monday-Friday 10 am to 4 pm Saturday-Sunday   The following sites will take your  insurance:    . [facility]  [phone] Get Driving Directions Find a Provider at this Location  [address] [place], [place] [address] . 10 am to 8 pm Monday-Friday . 12 pm to 8 pm Saturday-Sunday   . [facility]  [phone] Get Driving Directions Find a Provider at this Location  [address], [address] [place], [place] [address] . 8 am to 8 pm Monday-Friday . 9 am to 6 pm Saturday . 11 am to 6 pm Sunday   . [facility] [place]  [phone] Get Driving Directions  [address] [place], [place] [address] . 8 am to 8 pm Monday-Friday . 8 am to 4 pm Saturday-Sunday   . [facility] (walk-ins welcome, or call for a scheduled time)  [phone]  Get Driving Directions Find a Provider at this Location  [address] [place], [place] [address] . 8 am to 8:30 pm Monday-Thursday . 8 am to 6 pm Friday . 8 am to 4 pm Saturday-Sunday   Your e-visit answers were reviewed by a board certified advanced clinical practitioner to complete your personal care plan.  Thank you for using e-Visits.  

## 2016-03-20 ENCOUNTER — Encounter: Payer: Self-pay | Admitting: Family Medicine

## 2016-03-20 ENCOUNTER — Ambulatory Visit (INDEPENDENT_AMBULATORY_CARE_PROVIDER_SITE_OTHER): Payer: Medicare Other | Admitting: Family Medicine

## 2016-03-20 VITALS — BP 108/72 | Temp 98.8°F | Ht 63.5 in | Wt 209.1 lb

## 2016-03-20 DIAGNOSIS — J069 Acute upper respiratory infection, unspecified: Secondary | ICD-10-CM

## 2016-03-20 NOTE — Patient Instructions (Addendum)
If symptoms do not improve or worsen, or you develop a fever >100, please follow up for further evaluation. Your symptoms are most likely caused by a viral illness and below you will find information to help you at home.   Upper Respiratory Infection, Adult Most upper respiratory infections (URIs) are a viral infection of the air passages leading to the lungs. A URI affects the nose, throat, and upper air passages. The most common type of URI is nasopharyngitis and is typically referred to as "the common cold." URIs run their course and usually go away on their own. Most of the time, a URI does not require medical attention, but sometimes a bacterial infection in the upper airways can follow a viral infection. This is called a secondary infection. Sinus and middle ear infections are common types of secondary upper respiratory infections. Bacterial pneumonia can also complicate a URI. A URI can worsen asthma and chronic obstructive pulmonary disease (COPD). Sometimes, these complications can require emergency medical care and may be life threatening.  CAUSES Almost all URIs are caused by viruses. A virus is a type of germ and can spread from one person to another.  RISKS FACTORS You may be at risk for a URI if:   You smoke.   You have chronic heart or lung disease.  You have a weakened defense (immune) system.   You are very young or very old.   You have nasal allergies or asthma.  You work in crowded or poorly ventilated areas.  You work in health care facilities or schools. SIGNS AND SYMPTOMS  Symptoms typically develop 2-3 days after you come in contact with a cold virus. Most viral URIs last 7-10 days. However, viral URIs from the influenza virus (flu virus) can last 14-18 days and are typically more severe. Symptoms may include:   Runny or stuffy (congested) nose.   Sneezing.   Cough.   Sore throat.   Headache.   Fatigue.   Fever.   Loss of appetite.   Pain  in your forehead, behind your eyes, and over your cheekbones (sinus pain).  Muscle aches.  DIAGNOSIS  Your health care provider may diagnose a URI by:  Physical exam.  Tests to check that your symptoms are not due to another condition such as:  Strep throat.  Sinusitis.  Pneumonia.  Asthma. TREATMENT  A URI goes away on its own with time. It cannot be cured with medicines, but medicines may be prescribed or recommended to relieve symptoms. Medicines may help:  Reduce your fever.  Reduce your cough.  Relieve nasal congestion. HOME CARE INSTRUCTIONS   Take medicines only as directed by your health care provider.   Gargle warm saltwater or take cough drops to comfort your throat as directed by your health care provider.  Use a warm mist humidifier or inhale steam from a shower to increase air moisture. This may make it easier to breathe.  Drink enough fluid to keep your urine clear or pale yellow.   Eat soups and other clear broths and maintain good nutrition.   Rest as needed.   Return to work when your temperature has returned to normal or as your health care provider advises. You may need to stay home longer to avoid infecting others. You can also use a face mask and careful hand washing to prevent spread of the virus.  Increase the usage of your inhaler if you have asthma.   Do not use any tobacco products, including cigarettes, chewing  tobacco, or electronic cigarettes. If you need help quitting, ask your health care provider. PREVENTION  The best way to protect yourself from getting a cold is to practice good hygiene.   Avoid oral or hand contact with people with cold symptoms.   Wash your hands often if contact occurs.  There is no clear evidence that vitamin C, vitamin E, echinacea, or exercise reduces the chance of developing a cold. However, it is always recommended to get plenty of rest, exercise, and practice good nutrition.  SEEK MEDICAL CARE IF:     You are getting worse rather than better.   Your symptoms are not controlled by medicine.   You have chills.  You have worsening shortness of breath.  You have Rorke or red mucus.  You have yellow or Swindell nasal discharge.  You have pain in your face, especially when you bend forward.  You have a fever.  You have swollen neck glands.  You have pain while swallowing.  You have white areas in the back of your throat. SEEK IMMEDIATE MEDICAL CARE IF:   You have severe or persistent:  Headache.  Ear pain.  Sinus pain.  Chest pain.  You have chronic lung disease and any of the following:  Wheezing.  Prolonged cough.  Coughing up blood.  A change in your usual mucus.  You have a stiff neck.  You have changes in your:  Vision.  Hearing.  Thinking.  Mood. MAKE SURE YOU:   Understand these instructions.  Will watch your condition.  Will get help right away if you are not doing well or get worse.   This information is not intended to replace advice given to you by your health care provider. Make sure you discuss any questions you have with your health care provider.   Document Released: 12/06/2000 Document Revised: 10/27/2014 Document Reviewed: 09/17/2013 Elsevier Interactive Patient Education [employer]2016 Elsevier Inc.

## 2016-03-20 NOTE — Progress Notes (Signed)
Pre visit review using our clinic review tool, if applicable. No additional management support is needed unless otherwise documented below in the visit note. 
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
## 2016-03-28 ENCOUNTER — Encounter: Payer: Self-pay | Admitting: Family Medicine

## 2016-03-30 ENCOUNTER — Encounter: Payer: Self-pay | Admitting: Family Medicine

## 2016-03-30 ENCOUNTER — Ambulatory Visit (INDEPENDENT_AMBULATORY_CARE_PROVIDER_SITE_OTHER): Payer: Medicare Other | Admitting: Family Medicine

## 2016-03-30 VITALS — BP 102/78 | HR 95 | Temp 98.6°F | Wt 208.2 lb

## 2016-03-30 DIAGNOSIS — F411 Generalized anxiety disorder: Secondary | ICD-10-CM

## 2016-03-30 DIAGNOSIS — M545 Low back pain: Secondary | ICD-10-CM

## 2016-03-30 NOTE — Progress Notes (Signed)
Pre visit review using our clinic review tool, if applicable. No additional management support is needed unless otherwise documented below in the visit note. 
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
## 2016-03-30 NOTE — Patient Instructions (Signed)
Please acetaminophen 325 mg every 6 hours as needed for discomfort and follow up with your pain clinic as directed. If symptoms do not improve with treatment, worsen, or you develop >100, please follow up for further evaluation and treatment.  Also, if you use the medication provided by your urologist for pain which has hydrocodone and acetaminophen, please do not take regular acetaminophen with it since it is included in this medication.

## 2016-04-30 ENCOUNTER — Other Ambulatory Visit: Payer: Self-pay | Admitting: Family Medicine

## 2016-04-30 DIAGNOSIS — E1140 Type 2 diabetes mellitus with diabetic neuropathy, unspecified: Secondary | ICD-10-CM

## 2016-05-01 ENCOUNTER — Other Ambulatory Visit: Payer: Self-pay

## 2016-05-11 ENCOUNTER — Encounter: Payer: Self-pay | Admitting: Family Medicine

## 2016-05-23 ENCOUNTER — Encounter: Payer: Self-pay | Admitting: Family Medicine

## 2016-05-25 ENCOUNTER — Other Ambulatory Visit: Payer: Self-pay

## 2016-05-25 DIAGNOSIS — E1140 Type 2 diabetes mellitus with diabetic neuropathy, unspecified: Secondary | ICD-10-CM

## 2016-05-25 MED ORDER — GLUCOSE BLOOD VI STRP: ORAL_STRIP | 3 refills | Status: DC

## 2016-05-26 ENCOUNTER — Other Ambulatory Visit: Payer: Self-pay

## 2016-05-26 ENCOUNTER — Encounter: Payer: Self-pay | Admitting: Family Medicine

## 2016-05-26 DIAGNOSIS — E1140 Type 2 diabetes mellitus with diabetic neuropathy, unspecified: Secondary | ICD-10-CM

## 2016-05-26 MED ORDER — GLUCOSE BLOOD VI STRP: ORAL_STRIP | 3 refills | Status: DC

## 2016-05-26 MED ORDER — FREESTYLE LANCETS MISC: 3 refills | Status: DC

## 2016-05-29 ENCOUNTER — Other Ambulatory Visit: Payer: Self-pay

## 2016-05-29 DIAGNOSIS — E1140 Type 2 diabetes mellitus with diabetic neuropathy, unspecified: Secondary | ICD-10-CM

## 2016-05-29 MED ORDER — GLUCOSE BLOOD VI STRP: ORAL_STRIP | 3 refills | Status: DC

## 2016-05-29 MED ORDER — FREESTYLE LANCETS MISC: 3 refills | Status: AC

## 2016-06-02 ENCOUNTER — Encounter: Payer: Self-pay | Admitting: Family Medicine

## 2016-06-02 ENCOUNTER — Ambulatory Visit (INDEPENDENT_AMBULATORY_CARE_PROVIDER_SITE_OTHER): Payer: Medicare Other | Admitting: Family Medicine

## 2016-06-02 VITALS — BP 100/78 | HR 60 | Temp 98.1°F | Resp 12 | Wt 214.6 lb

## 2016-06-02 DIAGNOSIS — H60503 Unspecified acute noninfective otitis externa, bilateral: Secondary | ICD-10-CM | POA: Diagnosis not present

## 2016-06-02 DIAGNOSIS — J069 Acute upper respiratory infection, unspecified: Secondary | ICD-10-CM | POA: Diagnosis not present

## 2016-06-02 LAB — POCT INFLUENZA A/B
Influenza A, POC: NEGATIVE
Influenza B, POC: NEGATIVE

## 2016-06-02 MED ORDER — NEOMYCIN-POLYMYXIN-HC 3.5-10000-1 OT SOLN: 3.0000 [drp] | Freq: Four times a day (QID) | OTIC | 0 refills | Status: AC

## 2016-06-02 MED ORDER — FLUTICASONE PROPIONATE 50 MCG/ACT NA SUSP: 1.0000 | Freq: Two times a day (BID) | NASAL | 1 refills | Status: DC

## 2016-06-02 NOTE — Patient Instructions (Signed)
  Ms.Ahmari Manson PasseyBrown I have seen you today for an acute visit.  1. Acute otitis externa of both ears, unspecified type  Very mild.  Avoid Q tips.   - neomycin-polymyxin-hydrocortisone (CORTISPORIN) otic solution; Place 3 drops into both ears 4 (four) times daily.  Dispense: 10 mL; Refill: 0  2. URI, acute  - fluticasone (FLONASE) 50 MCG/ACT nasal spray; Place 1 spray into both nostrils 2 (two) times daily.  Dispense: 16 g; Refill: 1  viral infections are self-limited and we treat each symptom depending of severity.  Over the counter medications as decongestants and cold medications usually help, they need to be taken with caution if there is a history of high blood pressure or palpitations. Tylenol and/or Ibuprofen also helps with most symptoms (headache, muscle aching, fever,etc) Plenty of fluids. Honey helps with cough. Steam inhalations helps with runny nose, nasal congestion, and may prevent sinus infections. Cough and nasal congestion could last a few days and sometimes weeks. Please follow in not any better in 1-2 weeks or if symptoms get worse.   In general please monitor for signs of worsening symptoms and seek immediate medical attention if any concerning/warning symptom as we discussed.   If symptoms are not resolved in 1-2 weeks you should schedule a follow up appointment with your doctor, before if needed.  Please be sure you have an appointment already scheduled with your PCP before you leave today.
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
## 2016-06-02 NOTE — Progress Notes (Signed)
Pre visit review using our clinic review tool, if applicable. No additional management support is needed unless otherwise documented below in the visit note. 

## 2016-06-08 ENCOUNTER — Encounter: Payer: Self-pay | Admitting: Family Medicine

## 2016-06-14 ENCOUNTER — Encounter: Payer: Self-pay | Admitting: Family Medicine

## 2016-06-15 ENCOUNTER — Ambulatory Visit (INDEPENDENT_AMBULATORY_CARE_PROVIDER_SITE_OTHER): Payer: Medicare Other | Admitting: Family Medicine

## 2016-06-15 ENCOUNTER — Encounter: Payer: Self-pay | Admitting: Family Medicine

## 2016-06-15 VITALS — BP 108/74 | HR 66 | Temp 98.0°F | Wt 216.4 lb

## 2016-06-15 DIAGNOSIS — Z20828 Contact with and (suspected) exposure to other viral communicable diseases: Secondary | ICD-10-CM

## 2016-06-15 DIAGNOSIS — J069 Acute upper respiratory infection, unspecified: Secondary | ICD-10-CM

## 2016-06-15 LAB — POCT INFLUENZA A: Rapid Influenza A Ag: NEGATIVE

## 2016-06-15 MED ORDER — OSELTAMIVIR PHOSPHATE 75 MG PO CAPS: 75.0000 mg | ORAL_CAPSULE | Freq: Every day | ORAL | 0 refills | Status: DC
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
## 2016-06-15 NOTE — Telephone Encounter (Signed)
Pt scheduled for an appt today with Amil AmenJulia.

## 2016-06-15 NOTE — Addendum Note (Signed)
Addended by: Carola RhineKIGOTHO, NANCY N on: 06/15/2016 05:29 PM   Modules accepted: Orders

## 2016-06-15 NOTE — Patient Instructions (Signed)
Please take medication as directed and follow up if symptoms do not improve in 3 to 4 days, worsen, or you develop a fever.   Upper Respiratory Infection, Adult Most upper respiratory infections (URIs) are caused by a virus. A URI affects the nose, throat, and upper air passages. The most common type of URI is often called "the common cold." Follow these instructions at home:  Take medicines only as told by your doctor.  Gargle warm saltwater or take cough drops to comfort your throat as told by your doctor.  Use a warm mist humidifier or inhale steam from a shower to increase air moisture. This may make it easier to breathe.  Drink enough fluid to keep your pee (urine) clear or pale yellow.  Eat soups and other clear broths.  Have a healthy diet.  Rest as needed.  Go back to work when your fever is gone or your doctor says it is okay.  You may need to stay home longer to avoid giving your URI to others.  You can also wear a face mask and wash your hands often to prevent spread of the virus.  Use your inhaler more if you have asthma.  Do not use any tobacco products, including cigarettes, chewing tobacco, or electronic cigarettes. If you need help quitting, ask your doctor. Contact a doctor if:  You are getting worse, not better.  Your symptoms are not helped by medicine.  You have chills.  You are getting more short of breath.  You have Casimir or red mucus.  You have yellow or Racca discharge from your nose.  You have pain in your face, especially when you bend forward.  You have a fever.  You have puffy (swollen) neck glands.  You have pain while swallowing.  You have white areas in the back of your throat. Get help right away if:  You have very bad or constant:  Headache.  Ear pain.  Pain in your forehead, behind your eyes, and over your cheekbones (sinus pain).  Chest pain.  You have long-lasting (chronic) lung disease and any of the  following:  Wheezing.  Long-lasting cough.  Coughing up blood.  A change in your usual mucus.  You have a stiff neck.  You have changes in your:  Vision.  Hearing.  Thinking.  Mood. This information is not intended to replace advice given to you by your health care provider. Make sure you discuss any questions you have with your health care provider. Document Released: 11/29/2007 Document Revised: 02/13/2016 Document Reviewed: 09/17/2013 Elsevier Interactive Patient Education  2017 [employer]

## 2016-06-15 NOTE — Progress Notes (Signed)
Pre visit review using our clinic review tool, if applicable. No additional management support is needed unless otherwise documented below in the visit note. 

## 2016-06-15 NOTE — Telephone Encounter (Signed)
Noted Patient has an appointment with Roddie McJulia Kordsmeier FNP this afternoon.

## 2016-06-29 ENCOUNTER — Ambulatory Visit (INDEPENDENT_AMBULATORY_CARE_PROVIDER_SITE_OTHER): Payer: Medicare Other | Admitting: Family Medicine

## 2016-06-29 ENCOUNTER — Encounter: Payer: Self-pay | Admitting: Family Medicine

## 2016-06-29 DIAGNOSIS — L03011 Cellulitis of right finger: Secondary | ICD-10-CM

## 2016-06-29 DIAGNOSIS — E118 Type 2 diabetes mellitus with unspecified complications: Secondary | ICD-10-CM

## 2016-06-29 MED ORDER — CEPHALEXIN 500 MG PO CAPS: 500.0000 mg | ORAL_CAPSULE | Freq: Four times a day (QID) | ORAL | 0 refills | Status: DC

## 2016-06-29 NOTE — Progress Notes (Signed)
Pre visit review using our clinic review tool, if applicable. No additional management support is needed unless otherwise documented below in the visit note. 

## 2016-06-29 NOTE — Patient Instructions (Signed)
Please take antibiotic as directed and follow up in clinic on Monday or Tuesday for evaluation of treatment. If you notice any increased swelling or red streaks from area, you can also seek care in our Saturday clinic if needed.   Please continue to monitor your blood sugar levels once daily and if you are having highs and lows with your sugar, follow up for further evaluation and treatment.   Cellulitis, Adult Introduction Cellulitis is a skin infection. The infected area is usually red and sore. This condition occurs most often in the arms and lower legs. It is very important to get treated for this condition. Follow these instructions at home:  Take over-the-counter and prescription medicines only as told by your doctor.  If you were prescribed an antibiotic medicine, take it as told by your doctor. Do not stop taking the antibiotic even if you start to feel better.  Drink enough fluid to keep your pee (urine) clear or pale yellow.  Do not touch or rub the infected area.  Raise (elevate) the infected area above the level of your heart while you are sitting or lying down.  Place warm or cold wet cloths (warm or cold compresses) on the infected area. Do this as told by your doctor.  Keep all follow-up visits as told by your doctor. This is important. These visits let your doctor make sure your infection is not getting worse. Contact a doctor if:  You have a fever.  Your symptoms do not get better after 1-2 days of treatment.  Your bone or joint under the infected area starts to hurt after the skin has healed.  Your infection comes back. This can happen in the same area or another area.  You have a swollen bump in the infected area.  You have new symptoms.  You feel ill and also have muscle aches and pains. Get help right away if:  Your symptoms get worse.  You feel very sleepy.  You throw up (vomit) or have watery poop (diarrhea) for a long time.  There are red streaks  coming from the infected area.  Your red area gets larger.  Your red area turns darker. This information is not intended to replace advice given to you by your health care provider. Make sure you discuss any questions you have with your health care provider. Document Released: 11/29/2007 Document Revised: 11/18/2015 Document Reviewed: 04/21/2015  2017 Elsevier
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
## 2016-07-11 ENCOUNTER — Encounter: Payer: Self-pay | Admitting: Family Medicine

## 2016-07-11 ENCOUNTER — Other Ambulatory Visit: Payer: Self-pay | Admitting: Family Medicine

## 2016-07-11 DIAGNOSIS — E1140 Type 2 diabetes mellitus with diabetic neuropathy, unspecified: Secondary | ICD-10-CM

## 2016-07-14 NOTE — Progress Notes (Signed)
Spoke to patient verbalized understanding that [name] has placed a referral to endocrinology, and that patient should wait for a call from the endocrinology office.

## 2016-07-31 ENCOUNTER — Encounter: Payer: Self-pay | Admitting: Family Medicine

## 2016-07-31 ENCOUNTER — Ambulatory Visit: Payer: Medicare Other | Admitting: Endocrinology

## 2016-08-02 ENCOUNTER — Other Ambulatory Visit: Payer: Self-pay | Admitting: Family Medicine

## 2016-08-02 ENCOUNTER — Ambulatory Visit: Payer: Medicare Other | Admitting: Family Medicine

## 2016-08-02 DIAGNOSIS — J069 Acute upper respiratory infection, unspecified: Secondary | ICD-10-CM

## 2016-08-20 ENCOUNTER — Other Ambulatory Visit: Payer: Self-pay | Admitting: Family

## 2016-08-25 ENCOUNTER — Other Ambulatory Visit: Payer: Self-pay | Admitting: Family Medicine

## 2016-08-31 ENCOUNTER — Encounter: Payer: Self-pay | Admitting: Family Medicine

## 2016-09-11 ENCOUNTER — Other Ambulatory Visit: Payer: Self-pay

## 2016-09-11 DIAGNOSIS — J069 Acute upper respiratory infection, unspecified: Secondary | ICD-10-CM

## 2016-09-11 MED ORDER — FLUTICASONE PROPIONATE 50 MCG/ACT NA SUSP: NASAL | 0 refills | Status: DC

## 2016-09-14 ENCOUNTER — Ambulatory Visit: Payer: Medicare Other | Admitting: Family Medicine

## 2016-09-14 ENCOUNTER — Encounter: Payer: Self-pay | Admitting: Family Medicine

## 2016-09-15 ENCOUNTER — Other Ambulatory Visit: Payer: Self-pay | Admitting: Family Medicine

## 2016-09-15 ENCOUNTER — Encounter: Payer: Self-pay | Admitting: Family Medicine

## 2016-09-15 ENCOUNTER — Telehealth: Payer: Self-pay | Admitting: Family Medicine

## 2016-09-15 ENCOUNTER — Other Ambulatory Visit: Payer: Self-pay | Admitting: Family

## 2016-09-15 MED ORDER — SAXAGLIPTIN-METFORMIN ER 5-1000 MG PO TB24: 1.0000 | ORAL_TABLET | Freq: Every day | ORAL | 5 refills | Status: DC

## 2016-09-15 NOTE — Telephone Encounter (Signed)
Pt need new Rx for KOMBIGLYZE XR  1000 MG  Pharm:  [facility] [address]   Pt is aware of 3 business days for refills state that the pharmacy has sent this over unable to see that [facility].  Pt was very nasty b/c she has not gotten her medication yet.

## 2016-09-15 NOTE — Telephone Encounter (Signed)
See email dated 09/15/16. Rx sent. Pt notified.

## 2016-09-19 NOTE — Telephone Encounter (Signed)
Spoke to patient, stated that she was pleased that her diabetic medications have been send to her pharmacy.

## 2016-10-02 ENCOUNTER — Ambulatory Visit (INDEPENDENT_AMBULATORY_CARE_PROVIDER_SITE_OTHER): Payer: Medicare Other | Admitting: Family Medicine

## 2016-10-02 ENCOUNTER — Encounter: Payer: Self-pay | Admitting: Family Medicine

## 2016-10-02 VITALS — BP 118/84 | HR 82 | Temp 98.3°F | Wt 226.2 lb

## 2016-10-02 DIAGNOSIS — R109 Unspecified abdominal pain: Secondary | ICD-10-CM

## 2016-10-02 DIAGNOSIS — E118 Type 2 diabetes mellitus with unspecified complications: Secondary | ICD-10-CM | POA: Diagnosis not present

## 2016-10-02 DIAGNOSIS — K5903 Drug induced constipation: Secondary | ICD-10-CM | POA: Diagnosis not present

## 2016-10-02 LAB — BASIC METABOLIC PANEL
BUN: 14 mg/dL (ref 6–23)
CHLORIDE: 97 meq/L (ref 96–112)
CO2: 32 mEq/L (ref 19–32)
CREATININE: 0.64 mg/dL (ref 0.40–1.20)
Calcium: 9.6 mg/dL (ref 8.4–10.5)
GFR: 105.86 mL/min (ref >60.00)
Glucose, Bld: 118 mg/dL — ABNORMAL HIGH (ref 70–99)
POTASSIUM: 4.2 meq/L (ref 3.5–5.1)
Sodium: 138 mEq/L (ref 135–145)

## 2016-10-02 LAB — CBC WITH DIFFERENTIAL/PLATELET
BASOS PCT: 0.8 % (ref 0.0–3.0)
Basophils Absolute: 0.1 10*3/uL (ref 0.0–0.1)
EOS ABS: 0.2 10*3/uL (ref 0.0–0.7)
Eosinophils Relative: 2.3 % (ref 0.0–5.0)
HCT: 40.2 % (ref 36.0–46.0)
Hemoglobin: 13.2 g/dL (ref 12.0–15.0)
LYMPHS ABS: 2.6 10*3/uL (ref 0.7–4.0)
LYMPHS PCT: 28.1 % (ref 12.0–46.0)
MCHC: 33 g/dL (ref 30.0–36.0)
MCV: 92.8 fl (ref 78.0–100.0)
Monocytes Absolute: 0.7 10*3/uL (ref 0.1–1.0)
Monocytes Relative: 8 % (ref 3.0–12.0)
NEUTROS PCT: 60.8 % (ref 43.0–77.0)
Neutro Abs: 5.6 10*3/uL (ref 1.4–7.7)
Platelets: 268 10*3/uL (ref 150.0–400.0)
RBC: 4.33 Mil/uL (ref 3.87–5.11)
RDW: 13.4 % (ref 11.5–15.5)
WBC: 9.3 10*3/uL (ref 4.0–10.5)

## 2016-10-02 LAB — HEPATIC FUNCTION PANEL
ALT: 11 U/L (ref 0–35)
AST: 12 U/L (ref 0–37)
Albumin: 4.2 g/dL (ref 3.5–5.2)
Alkaline Phosphatase: 54 U/L (ref 39–117)
BILIRUBIN DIRECT: 0.1 mg/dL (ref 0.0–0.3)
Total Bilirubin: 0.3 mg/dL (ref 0.2–1.2)
Total Protein: 7 g/dL (ref 6.0–8.3)

## 2016-10-02 LAB — LIPASE: LIPASE: 60 U/L — AB (ref 11.0–59.0)

## 2016-10-02 NOTE — Patient Instructions (Signed)
It was a pleasure to see you today. Please add miralax daily and increase your water intake to prevent constipation. Also, please call endocrinology office that you were referred to for an appointment.  We have ordered labs or studies at this visit. It can take up to 1-2 weeks for results and processing. IF results require follow up or explanation, we will call you with instructions. Clinically stable results will be released to your [facility]. If you have not heard from [place] or cannot find your results in [facility] in 2 weeks please contact our office at [phone].  If you are not yet signed up for [facility], please consider signing up    Constipation, Adult Constipation is when a person:  Poops (has a bowel movement) fewer times in a week than normal.  Has a hard time pooping.  Has poop that is dry, hard, or bigger than normal. Follow these instructions at home: Eating and drinking    Eat foods that have a lot of fiber, such as:  Fresh fruits and vegetables.  Whole grains.  Beans.  Eat less of foods that are high in fat, low in fiber, or overly processed, such as:  [place] fries.  Hamburgers.  Cookies.  Candy.  Soda.  Drink enough fluid to keep your pee (urine) clear or pale yellow. General instructions   Exercise regularly or as told by your doctor.  Go to the restroom when you feel like you need to poop. Do not hold it in.  Take over-the-counter and prescription medicines only as told by your doctor. These include any fiber supplements.  Do pelvic floor retraining exercises, such as:  Doing deep breathing while relaxing your lower belly (abdomen).  Relaxing your pelvic floor while pooping.  Watch your condition for any changes.  Keep all follow-up visits as told by your doctor. This is important. Contact a doctor if:  You have pain that gets worse.  You have a fever.  You have not pooped for 4 days.  You throw up (vomit).  You are not hungry.  You  lose weight.  You are bleeding from the anus.  You have thin, pencil-like poop (stool). Get help right away if:  You have a fever, and your symptoms suddenly get worse.  You leak poop or have blood in your poop.  Your belly feels hard or bigger than normal (is bloated).  You have very bad belly pain.  You feel dizzy or you faint. This information is not intended to replace advice given to you by your health care provider. Make sure you discuss any questions you have with your health care provider. Document Released: 11/29/2007 Document Revised: 12/31/2015 Document Reviewed: 12/01/2015 Elsevier Interactive Patient Education  2017 [employer].
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
## 2016-10-03 ENCOUNTER — Encounter: Payer: Self-pay | Admitting: Family Medicine

## 2016-10-19 ENCOUNTER — Encounter: Payer: Self-pay | Admitting: Endocrinology

## 2016-10-19 ENCOUNTER — Ambulatory Visit (INDEPENDENT_AMBULATORY_CARE_PROVIDER_SITE_OTHER): Payer: Medicare Other | Admitting: Endocrinology

## 2016-10-19 VITALS — BP 112/78 | HR 78 | Ht 65.5 in | Wt 230.0 lb

## 2016-10-19 DIAGNOSIS — E1140 Type 2 diabetes mellitus with diabetic neuropathy, unspecified: Secondary | ICD-10-CM | POA: Diagnosis not present

## 2016-10-19 LAB — POCT GLYCOSYLATED HEMOGLOBIN (HGB A1C): Hemoglobin A1C: 6.7

## 2016-10-19 MED ORDER — METFORMIN HCL ER 500 MG PO TB24: 1000.0000 mg | ORAL_TABLET | Freq: Every day | ORAL | 11 refills | Status: DC

## 2016-10-19 MED ORDER — CANAGLIFLOZIN 100 MG PO TABS: 100.0000 mg | ORAL_TABLET | Freq: Every day | ORAL | 11 refills | Status: DC

## 2016-10-19 MED ORDER — GLUCOSE BLOOD VI STRP: 1.0000 | ORAL_STRIP | Freq: Every day | 3 refills | Status: DC

## 2016-10-19 MED ORDER — SAXAGLIPTIN HCL 2.5 MG PO TABS: 2.5000 mg | ORAL_TABLET | Freq: Every day | ORAL | 11 refills | Status: DC

## 2016-10-19 NOTE — Patient Instructions (Addendum)
good diet and exercise significantly improve the control of your diabetes.  please let me know if you wish to be referred to a dietician.  high blood sugar is very risky to your health.  you should see an eye doctor and dentist every year.  It is very important to get all recommended vaccinations.  Controlling your blood pressure and cholesterol drastically reduces the damage diabetes does to your body.  Those who smoke should quit.  Please discuss these with your doctor.  check your blood sugar once a day.  vary the time of day when you check, between before the 3 meals, and at bedtime.  also check if you have symptoms of your blood sugar being too high or too low.  please keep a record of the readings and bring it to your next appointment here (or you can bring the meter itself).  You can write it on any piece of paper.  please call us sooner if your blood sugar goes below 70, or if you have a lot of readings over 200. Please change your meds to those listed below.   Here is a new meter. I have sent a prescription to your pharmacy, for strips.  Please come back for a follow-up appointment in 3 months
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
## 2016-10-20 ENCOUNTER — Other Ambulatory Visit: Payer: Self-pay

## 2016-10-20 MED ORDER — GLUCOSE BLOOD VI STRP: 1.0000 | ORAL_STRIP | Freq: Every day | 3 refills | Status: AC

## 2016-10-23 ENCOUNTER — Encounter: Payer: Self-pay | Admitting: Family Medicine

## 2016-10-31 ENCOUNTER — Encounter: Payer: Self-pay | Admitting: Endocrinology

## 2016-10-31 ENCOUNTER — Telehealth: Payer: Self-pay | Admitting: Family Medicine

## 2016-10-31 NOTE — Telephone Encounter (Signed)
Ok, please hold off for now. Please continue the same other diabetes medications. I'll see you next time.

## 2016-10-31 NOTE — Telephone Encounter (Signed)
Patient is having problems with the Invokana medication that was given her.  She experienced her heart racing, dizziness and feeling faint.  Patient didn't take that medication this morning.  Please advise.

## 2016-11-01 ENCOUNTER — Telehealth: Payer: Self-pay | Admitting: Endocrinology

## 2016-11-01 NOTE — Telephone Encounter (Signed)
[employer] Call:  Caller states she has called and left message with office.  Started on 3 new meds for diabetes and 1 med was causing heart palpitations, dizziness, and sweating.  Didn't take that med this morning and wants to know what to do for in the morning. Has been on meds for a week.  No current sx.

## 2016-11-01 NOTE — Telephone Encounter (Signed)
Spoke with patient and she stated an understanding 

## 2016-11-01 NOTE — Telephone Encounter (Signed)
please call patient: Just to verify: ok to stay off the invokana, and continue the metformin and onglyza.  Ok with you?

## 2016-11-02 NOTE — Telephone Encounter (Signed)
Patient is aware 

## 2016-11-09 ENCOUNTER — Encounter: Payer: Self-pay | Admitting: Family Medicine

## 2016-11-16 ENCOUNTER — Ambulatory Visit: Payer: Medicare Other | Admitting: Family Medicine

## 2016-11-16 DIAGNOSIS — Z0289 Encounter for other administrative examinations: Secondary | ICD-10-CM

## 2016-11-17 ENCOUNTER — Encounter: Payer: Self-pay | Admitting: Family Medicine

## 2016-11-17 ENCOUNTER — Ambulatory Visit (INDEPENDENT_AMBULATORY_CARE_PROVIDER_SITE_OTHER): Payer: Medicare Other | Admitting: Family Medicine

## 2016-11-17 VITALS — BP 110/70 | HR 73 | Temp 98.4°F | Wt 230.8 lb

## 2016-11-17 DIAGNOSIS — R300 Dysuria: Secondary | ICD-10-CM

## 2016-11-17 DIAGNOSIS — R600 Localized edema: Secondary | ICD-10-CM

## 2016-11-17 DIAGNOSIS — R1030 Lower abdominal pain, unspecified: Secondary | ICD-10-CM

## 2016-11-17 LAB — POCT URINALYSIS DIPSTICK
Bilirubin, UA: NEGATIVE
GLUCOSE UA: NEGATIVE
Ketones, UA: NEGATIVE
LEUKOCYTES UA: NEGATIVE
NITRITE UA: NEGATIVE
PH UA: 8 (ref 5.0–8.0)
Protein, UA: NEGATIVE
RBC UA: NEGATIVE
Spec Grav, UA: <=1.005 — AB (ref 1.010–1.025)
UROBILINOGEN UA: 0.2 U/dL

## 2016-11-17 NOTE — Patient Instructions (Signed)
Try to keep sodium intake < 3,000 mg per day Drink plenty of fluids Your urine looks clear.
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
## 2016-12-18 ENCOUNTER — Encounter: Payer: Medicare Other | Admitting: Family Medicine

## 2016-12-22 ENCOUNTER — Other Ambulatory Visit: Payer: Self-pay | Admitting: Urology

## 2016-12-25 ENCOUNTER — Encounter: Payer: Self-pay | Admitting: Family Medicine

## 2016-12-25 ENCOUNTER — Ambulatory Visit (INDEPENDENT_AMBULATORY_CARE_PROVIDER_SITE_OTHER): Payer: Medicare Other | Admitting: Family Medicine

## 2016-12-25 ENCOUNTER — Other Ambulatory Visit: Payer: Self-pay | Admitting: Urology

## 2016-12-25 VITALS — BP 100/82 | HR 94 | Temp 98.7°F | Ht 65.0 in | Wt 237.6 lb

## 2016-12-25 DIAGNOSIS — Z13220 Encounter for screening for lipoid disorders: Secondary | ICD-10-CM

## 2016-12-25 DIAGNOSIS — F411 Generalized anxiety disorder: Secondary | ICD-10-CM

## 2016-12-25 DIAGNOSIS — R635 Abnormal weight gain: Secondary | ICD-10-CM | POA: Diagnosis not present

## 2016-12-25 DIAGNOSIS — E118 Type 2 diabetes mellitus with unspecified complications: Secondary | ICD-10-CM | POA: Diagnosis not present

## 2016-12-25 DIAGNOSIS — R52 Pain, unspecified: Secondary | ICD-10-CM

## 2016-12-25 LAB — LIPID PANEL
Cholesterol: 222 mg/dL — ABNORMAL HIGH (ref 0–200)
HDL: 61.3 mg/dL (ref >39.00)
NONHDL: 160.7
Total CHOL/HDL Ratio: 4
Triglycerides: 201 mg/dL — ABNORMAL HIGH (ref 0.0–149.0)
VLDL: 40.2 mg/dL — AB (ref 0.0–40.0)

## 2016-12-25 LAB — LIPASE: Lipase: 29 U/L (ref 11.0–59.0)

## 2016-12-25 LAB — LDL CHOLESTEROL, DIRECT: Direct LDL: 133 mg/dL

## 2016-12-25 LAB — TSH: TSH: 0.85 u[IU]/mL (ref 0.35–4.50)
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
## 2016-12-25 NOTE — Patient Instructions (Signed)
It was a pleasure to see you today. We have ordered labs or studies at this visit. It can take up to 1-2 weeks for results and processing. IF results require follow up or explanation, we will call you with instructions. Clinically stable results will be released to your [facility]. If you have not heard from [place] or cannot find your results in [facility] in 2 weeks please contact our office at [phone].  If you are not yet signed up for [facility], please consider signing up   Please follow up with Dr. Everardo All, your psychiatrist, and pain management provider as scheduled. Also, please schedule your gynecology appointment.    Blood Glucose Monitoring, Adult Monitoring your blood sugar (glucose) helps you manage your diabetes. It also helps you and your health care provider determine how well your diabetes management plan is working. Blood glucose monitoring involves checking your blood glucose as often as directed, and keeping a record (log) of your results over time. Why should I monitor my blood glucose? Checking your blood glucose regularly can:  Help you understand how food, exercise, illnesses, and medicines affect your blood glucose.  Let you know what your blood glucose is at any time. You can quickly tell if you are having low blood glucose (hypoglycemia) or high blood glucose (hyperglycemia).  Help you and your health care provider adjust your medicines as needed.  When should I check my blood glucose? Follow instructions from your health care provider about how often to check your blood glucose. This may depend on:  The type of diabetes you have.  How well-controlled your diabetes is.  Medicines you are taking.  If you have type 1 diabetes:  Check your blood glucose at least 2 times a day.  Also check your blood glucose: ? Before every insulin injection. ? Before and after exercise. ? Between meals. ? 2 hours after a meal. ? Occasionally between 2:00 a.m. and 3:00 a.m., as  directed. ? Before potentially dangerous tasks, like driving or using heavy machinery. ? At bedtime.  You may need to check your blood glucose more often, up to 6-10 times a day: ? If you use an insulin pump. ? If you need multiple daily injections (MDI). ? If your diabetes is not well-controlled. ? If you are ill. ? If you have a history of severe hypoglycemia. ? If you have a history of not knowing when your blood glucose is getting low (hypoglycemia unawareness). If you have type 2 diabetes:  If you take insulin or other diabetes medicines, check your blood glucose at least 2 times a day.  If you are on intensive insulin therapy, check your blood glucose at least 4 times a day. Occasionally, you may also need to check between 2:00 a.m. and 3:00 a.m., as directed.  Also check your blood glucose: ? Before and after exercise. ? Before potentially dangerous tasks, like driving or using heavy machinery.  You may need to check your blood glucose more often if: ? Your medicine is being adjusted. ? Your diabetes is not well-controlled. ? You are ill. What is a blood glucose log?  A blood glucose log is a record of your blood glucose readings. It helps you and your health care provider: ? Look for patterns in your blood glucose over time. ? Adjust your diabetes management plan as needed.  Every time you check your blood glucose, write down your result and notes about things that may be affecting your blood glucose, such as your diet and  exercise for the day.  Most glucose meters store a record of glucose readings in the meter. Some meters allow you to download your records to a computer. How do I check my blood glucose? Follow these steps to get accurate readings of your blood glucose: Supplies needed   Blood glucose meter.  Test strips for your meter. Each meter has its own strips. You must use the strips that come with your meter.  A needle to prick your finger (lancet). Do not  use lancets more than once.  A device that holds the lancet (lancing device).  A journal or log book to write down your results. Procedure  Wash your hands with soap and water.  Prick the side of your finger (not the tip) with the lancet. Use a different finger each time.  Gently rub the finger until a small drop of blood appears.  Follow instructions that come with your meter for inserting the test strip, applying blood to the strip, and using your blood glucose meter.  Write down your result and any notes. Alternative testing sites  Some meters allow you to use areas of your body other than your finger (alternative sites) to test your blood.  If you think you may have hypoglycemia, or if you have hypoglycemia unawareness, do not use alternative sites. Use your finger instead.  Alternative sites may not be as accurate as the fingers, because blood flow is slower in these areas. This means that the result you get may be delayed, and it may be different from the result that you would get from your finger.  The most common alternative sites are: ? Forearm. ? Thigh. ? Palm of the hand. Additional tips  Always keep your supplies with you.  If you have questions or need help, all blood glucose meters have a 24-hour "hotline" number that you can call. You may also contact your health care provider.  After you use a few boxes of test strips, adjust (calibrate) your blood glucose meter by following instructions that came with your meter. This information is not intended to replace advice given to you by your health care provider. Make sure you discuss any questions you have with your health care provider. Document Released: 06/15/2003 Document Revised: 12/31/2015 Document Reviewed: 11/22/2015 Elsevier Interactive Patient Education  2017 [employer]

## 2016-12-26 ENCOUNTER — Encounter: Payer: Self-pay | Admitting: Family Medicine

## 2016-12-26 NOTE — Telephone Encounter (Signed)
Patient came in to office wanting to leave urine sample for testing for UTI. Pt was advised that since this was new onset of symptoms she would need to be seen. She was offered several appointments with various providers within the next hour. She refused all appointments offered today. She has scheduled OV with Amil AmenJulia for Thursday. Nothing further needed at this time.

## 2016-12-28 ENCOUNTER — Encounter: Payer: Self-pay | Admitting: Family Medicine

## 2016-12-28 ENCOUNTER — Other Ambulatory Visit: Payer: Self-pay

## 2016-12-28 ENCOUNTER — Ambulatory Visit (INDEPENDENT_AMBULATORY_CARE_PROVIDER_SITE_OTHER): Payer: Medicare Other | Admitting: Family Medicine

## 2016-12-28 VITALS — BP 108/80 | HR 70 | Temp 98.8°F | Wt 238.0 lb

## 2016-12-28 DIAGNOSIS — R300 Dysuria: Secondary | ICD-10-CM

## 2016-12-28 LAB — POC URINALSYSI DIPSTICK (AUTOMATED)
Bilirubin, UA: NEGATIVE
Glucose, UA: NEGATIVE
Ketones, UA: NEGATIVE
NITRITE UA: POSITIVE
PH UA: 7 (ref 5.0–8.0)
PROTEIN UA: NEGATIVE
RBC UA: NEGATIVE
Spec Grav, UA: 1.02 (ref 1.010–1.025)
UROBILINOGEN UA: 0.2 U/dL

## 2016-12-28 MED ORDER — AMOXICILLIN-POT CLAVULANATE 875-125 MG PO TABS: 1.0000 | ORAL_TABLET | Freq: Two times a day (BID) | ORAL | 0 refills | Status: DC

## 2016-12-28 NOTE — Patient Instructions (Signed)
Please drink enough water to keep your urine pale yellow or clear. Also, please use vaginal yeast treatment as discussed.   Urinary Tract Infection, Adult A urinary tract infection (UTI) is an infection of any part of the urinary tract, which includes the kidneys, ureters, bladder, and urethra. These organs make, store, and get rid of urine in the body. UTI can be a bladder infection (cystitis) or kidney infection (pyelonephritis). What are the causes? This infection may be caused by fungi, viruses, or bacteria. Bacteria are the most common cause of UTIs. This condition can also be caused by repeated incomplete emptying of the bladder during urination. What increases the risk? This condition is more likely to develop if:  You ignore your need to urinate or hold urine for long periods of time.  You do not empty your bladder completely during urination.  You wipe back to front after urinating or having a bowel movement, if you are female.  You are uncircumcised, if you are female.  You are constipated.  You have a urinary catheter that stays in place (indwelling).  You have a weak defense (immune) system.  You have a medical condition that affects your bowels, kidneys, or bladder.  You have diabetes.  You take antibiotic medicines frequently or for long periods of time, and the antibiotics no longer work well against certain types of infections (antibiotic resistance).  You take medicines that irritate your urinary tract.  You are exposed to chemicals that irritate your urinary tract.  You are female.  What are the signs or symptoms? Symptoms of this condition include:  Fever.  Frequent urination or passing small amounts of urine frequently.  Needing to urinate urgently.  Pain or burning with urination.  Urine that smells bad or unusual.  Cloudy urine.  Pain in the lower abdomen or back.  Trouble urinating.  Blood in the urine.  Vomiting or being less hungry than  normal.  Diarrhea or abdominal pain.  Vaginal discharge, if you are female.  How is this diagnosed? This condition is diagnosed with a medical history and physical exam. You will also need to provide a urine sample to test your urine. Other tests may be done, including:  Blood tests.  Sexually transmitted disease (STD) testing.  If you have had more than one UTI, a cystoscopy or imaging studies may be done to determine the cause of the infections. How is this treated? Treatment for this condition often includes a combination of two or more of the following:  Antibiotic medicine.  Other medicines to treat less common causes of UTI.  Over-the-counter medicines to treat pain.  Drinking enough water to stay hydrated.  Follow these instructions at home:  Take over-the-counter and prescription medicines only as told by your health care provider.  If you were prescribed an antibiotic, take it as told by your health care provider. Do not stop taking the antibiotic even if you start to feel better.  Avoid alcohol, caffeine, tea, and carbonated beverages. They can irritate your bladder.  Drink enough fluid to keep your urine clear or pale yellow.  Keep all follow-up visits as told by your health care provider. This is important.  Make sure to: ? Empty your bladder often and completely. Do not hold urine for long periods of time. ? Empty your bladder before and after sex. ? Wipe from front to back after a bowel movement if you are female. Use each tissue one time when you wipe. Contact a health care provider  if:  You have back pain.  You have a fever.  You feel nauseous or vomit.  Your symptoms do not get better after 3 days.  Your symptoms go away and then return. Get help right away if:  You have severe back pain or lower abdominal pain.  You are vomiting and cannot keep down any medicines or water. This information is not intended to replace advice given to you by  your health care provider. Make sure you discuss any questions you have with your health care provider. Document Released: 03/22/2005 Document Revised: 11/24/2015 Document Reviewed: 05/03/2015 Elsevier Interactive Patient Education  2017 [employer].
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
## 2016-12-30 LAB — URINE CULTURE

## 2017-01-03 ENCOUNTER — Encounter: Payer: Self-pay | Admitting: Family Medicine

## 2017-01-03 ENCOUNTER — Ambulatory Visit (INDEPENDENT_AMBULATORY_CARE_PROVIDER_SITE_OTHER): Payer: Medicare Other | Admitting: Family Medicine

## 2017-01-03 VITALS — BP 110/80 | HR 72 | Temp 98.2°F | Wt 237.6 lb

## 2017-01-03 DIAGNOSIS — R0789 Other chest pain: Secondary | ICD-10-CM

## 2017-01-03 DIAGNOSIS — M549 Dorsalgia, unspecified: Secondary | ICD-10-CM

## 2017-01-03 DIAGNOSIS — S80219A Abrasion, unspecified knee, initial encounter: Secondary | ICD-10-CM
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
## 2017-01-03 NOTE — Patient Instructions (Signed)
Consider short term use of Aleve as needed May continue the Baclofen as needed for muscle tightness Consider moist heat (eg tub baths) Unfortunately, you will likely be sore for several days.

## 2017-01-10 ENCOUNTER — Other Ambulatory Visit: Payer: Medicare Other

## 2017-01-18 ENCOUNTER — Ambulatory Visit (INDEPENDENT_AMBULATORY_CARE_PROVIDER_SITE_OTHER): Payer: Medicare Other | Admitting: Family Medicine

## 2017-01-18 ENCOUNTER — Encounter: Payer: Self-pay | Admitting: Family Medicine

## 2017-01-18 ENCOUNTER — Ambulatory Visit: Payer: Medicare Other | Admitting: Endocrinology

## 2017-01-18 VITALS — BP 118/74 | HR 84 | Temp 98.4°F | Ht 65.0 in | Wt 237.1 lb

## 2017-01-18 DIAGNOSIS — R0789 Other chest pain: Secondary | ICD-10-CM

## 2017-01-18 DIAGNOSIS — M797 Fibromyalgia: Secondary | ICD-10-CM

## 2017-01-18 DIAGNOSIS — K219 Gastro-esophageal reflux disease without esophagitis: Secondary | ICD-10-CM
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
## 2017-01-18 NOTE — Patient Instructions (Signed)
BEFORE YOU LEAVE: -follow up: with PCP in 2-3 weeks -EKG -xray sheet -labs  Start nexium and take over the counter dose once daily for 2 weeks  Eat a healthy diet and get regular exercise  Gentle stretching daily  Can try heat and tiger balm for pain  We have ordered labs or studies at this visit. It can take up to 1-2 weeks for results and processing. IF results require follow up or explanation, we will call you with instructions. Clinically stable results will be released to your [facility]. If you have not heard from us or cannot find your results in [facility] in 2 weeks please contact our office at [phone].  If you are not yet signed up for [facility], please consider signing up.  I hope you are feeling better soon! Seek care sooner if worsening or new concerns.

## 2017-01-19 ENCOUNTER — Ambulatory Visit: Payer: Medicare Other | Admitting: Endocrinology

## 2017-01-19 ENCOUNTER — Encounter: Payer: Self-pay | Admitting: Endocrinology

## 2017-01-19 LAB — D-DIMER, QUANTITATIVE (NOT AT ARMC): D DIMER QUANT: 0.41 ug{FEU}/mL (ref <0.50)

## 2017-01-24 ENCOUNTER — Ambulatory Visit: Payer: Medicare Other

## 2017-01-24 ENCOUNTER — Telehealth: Payer: Self-pay | Admitting: Endocrinology

## 2017-01-24 NOTE — Telephone Encounter (Signed)
Spoke with the pt and changed her NS to cancellation

## 2017-01-24 NOTE — Telephone Encounter (Signed)
Patient would like a call from the supervisor to dispute her no show on 7/27. Patient cancelled 7/26 however the appointment went in as a no show rather than a cancel. Patient does not want to receive a bill. Call patient to advise.

## 2017-01-29 ENCOUNTER — Other Ambulatory Visit: Payer: Medicare Other

## 2017-02-05 ENCOUNTER — Ambulatory Visit: Payer: Medicare Other | Admitting: Family Medicine

## 2017-02-05 NOTE — Progress Notes (Deleted)
   Subjective:    Patient ID: Crystal CritchleyAnissa Nelson, female    DOB: 06/21/1971, 46 y.o.   MRN: [id number]  HPI  [name] is a 46 year old female who presents today for follow up of chest wall pain that was evaluated 3 weeks ago.  Pain has  Rated as  Described as Aggravating factor: Alleviating factor: Treatment: She denies fever, chest congestions, SOB, DOE, palpitations, worsening with activity, or rash  Last visit EKG on 01/18/17 was noted as NSR with T wave abnormalities that was unchanged from EKG in 2016. CXR was ordered for patient however she did not obtain this imaging. D-dimer WNL on 01/18/17  She started nexium OTC dose for 2 weeks on 01/18/17.   Review of Systems     Objective:   Physical Exam        Assessment & Plan:
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
## 2017-02-08 ENCOUNTER — Ambulatory Visit (INDEPENDENT_AMBULATORY_CARE_PROVIDER_SITE_OTHER): Payer: Medicare Other

## 2017-02-08 ENCOUNTER — Telehealth: Payer: Self-pay | Admitting: Family Medicine

## 2017-02-08 VITALS — BP 108/80 | HR 89 | Ht 65.0 in | Wt 239.0 lb

## 2017-02-08 DIAGNOSIS — E1140 Type 2 diabetes mellitus with diabetic neuropathy, unspecified: Secondary | ICD-10-CM | POA: Diagnosis not present

## 2017-02-08 DIAGNOSIS — Z0000 Encounter for general adult medical examination without abnormal findings: Secondary | ICD-10-CM | POA: Diagnosis not present

## 2017-02-08 LAB — HEMOGLOBIN A1C: Hgb A1c MFr Bld: 7 % — ABNORMAL HIGH (ref 4.6–6.5)

## 2017-02-08 LAB — MICROALBUMIN / CREATININE URINE RATIO
CREATININE, U: 111.8 mg/dL
MICROALB/CREAT RATIO: 0.6 mg/g (ref 0.0–30.0)
Microalb, Ur: <0.7 mg/dL (ref 0.0–1.9)

## 2017-02-08 NOTE — Telephone Encounter (Signed)
Pt would like to switch from BeatriceJulia to Dr Salomon FickBanks due to less availability with Amil AmenJulia. Pt canceled 8/24 with Amil AmenJulia and would like a call to r/s with Banks. Please advise.

## 2017-02-08 NOTE — Patient Instructions (Addendum)
[name] , Thank you for taking time to come for your Medicare Wellness Visit. I appreciate your ongoing commitment to your health goals. Please review the following plan we discussed and let me know if I can assist you in the future.   Will need mammogram when ready; deferred to Dr. Daiva Huge Not of age for bone density - Dr. Daiva Huge   Will try to complete AD; Given copy  Referred to [facility] for questions [facility] offers free advance directive forms, as well as assistance in completing the forms themselves. For assistance, contact the Spiritual Care Department at [phone], or the Clinical Social Work Department at [phone].   Foot exam completed today  Please call dr. Hassell Done to get your eye report sent to our practice / the feb exam would be helpful   Will have lab draw a1c and will collect ua albumin    These are the goals we discussed: Goals    . Weight (lb) < 200 lb (90.7 kg)          Weight is calories in and calories out  Fat and Cholesterol Restricted Diet Getting too much fat and cholesterol in your diet may cause health problems. Following this diet helps keep your fat and cholesterol at normal levels. This can keep you from getting sick. What types of fat should I choose?  Choose monosaturated and polyunsaturated fats. These are found in foods such as olive oil, canola oil, flaxseeds, walnuts, almonds, and seeds.  Eat more omega-3 fats. Good choices include salmon, mackerel, sardines, tuna, flaxseed oil, and ground flaxseeds.  Limit saturated fats. These are in animal products such as meats, butter, and cream. They can also be in plant products such as palm oil, palm kernel oil, and coconut oil.  Avoid foods with partially hydrogenated oils in them. These contain trans fats. Examples of foods that have trans fats are stick margarine, some tub margarines, cookies, crackers, and other baked goods. What general guidelines do I need to follow?  Check food labels. Look  for the words "trans fat" and "saturated fat."  When preparing a meal: ? Fill half of your plate with vegetables and green salads. ? Fill one fourth of your plate with whole grains. Look for the word "whole" as the first word in the ingredient list. ? Fill one fourth of your plate with lean protein foods.  Eat more foods that have fiber, like apples, carrots, beans, peas, and barley.  Eat more home-cooked foods. Eat less at restaurants and buffets.  Limit or avoid alcohol.  Limit foods high in starch and sugar.  Limit fried foods.  Cook foods without frying them. Baking, boiling, grilling, and broiling are all great options.  Lose weight if you are overweight. Losing even a small amount of weight can help your overall health. It can also help prevent diseases such as diabetes and heart disease. What foods can I eat? Grains Whole grains, such as whole wheat or whole grain breads, crackers, cereals, and pasta. Unsweetened oatmeal, bulgur, barley, quinoa, or [place] rice. Corn or whole wheat flour tortillas. Vegetables Fresh or frozen vegetables (raw, steamed, roasted, or grilled). Green salads. Fruits All fresh, canned (in natural juice), or frozen fruits. Meat and Other Protein Products Ground beef (85% or leaner), grass-fed beef, or beef trimmed of fat. Skinless chicken or [place]. Ground chicken or [place]. Pork trimmed of fat. All fish and seafood. Eggs. Dried beans, peas, or lentils. Unsalted nuts or seeds. Unsalted canned or dry beans. Dairy  Low-fat dairy products, such as skim or 1% milk, 2% or reduced-fat cheeses, low-fat ricotta or cottage cheese, or plain low-fat yogurt. Fats and Oils Tub margarines without trans fats. Light or reduced-fat mayonnaise and salad dressings. Avocado. Olive, canola, sesame, or safflower oils. Natural peanut or almond butter (choose ones without added sugar and oil). The items listed above may not be a complete list of recommended foods or beverages.  Contact your dietitian for more options. What foods are not recommended? Grains White bread. White pasta. White rice. Cornbread. Bagels, pastries, and croissants. Crackers that contain trans fat. Vegetables White potatoes. Corn. Creamed or fried vegetables. Vegetables in a cheese sauce. Fruits Dried fruits. Canned fruit in light or heavy syrup. Fruit juice. Meat and Other Protein Products Fatty cuts of meat. Ribs, chicken wings, bacon, sausage, bologna, salami, chitterlings, fatback, hot dogs, bratwurst, and packaged luncheon meats. Liver and organ meats. Dairy Whole or 2% milk, cream, half-and-half, and cream cheese. Whole milk cheeses. Whole-fat or sweetened yogurt. Full-fat cheeses. Nondairy creamers and whipped toppings. Processed cheese, cheese spreads, or cheese curds. Sweets and Desserts Corn syrup, sugars, honey, and molasses. Candy. Jam and jelly. Syrup. Sweetened cereals. Cookies, pies, cakes, donuts, muffins, and ice cream. Fats and Oils Butter, stick margarine, lard, shortening, ghee, or bacon fat. Coconut, palm kernel, or palm oils. Beverages Alcohol. Sweetened drinks (such as sodas, lemonade, and fruit drinks or punches). The items listed above may not be a complete list of foods and beverages to avoid. Contact your dietitian for more information. This information is not intended to replace advice given to you by your health care provider. Make sure you discuss any questions you have with your health care provider. Document Released: 12/12/2011 Document Revised: 02/17/2016 Document Reviewed: 09/11/2013 Elsevier Interactive Patient Education  [employer].        This is a list of the screening recommended for you and due dates:  Health Maintenance  Topic Date Due  . HIV Screening  01/25/1985  . Pap Smear  07/29/2012  . Pneumococcal vaccine (2) 09/11/2013  . Complete foot exam   09/26/2016  . Eye exam for diabetics  10/30/2016  . Urine Protein Check  01/23/2017    . Flu Shot  01/24/2017  . Hemoglobin A1C  04/20/2017  . Tetanus Vaccine  12/10/2021   Health Maintenance, Female Adopting a healthy lifestyle and getting preventive care can go a long way to promote health and wellness. Talk with your health care provider about what schedule of regular examinations is right for you. This is a good chance for you to check in with your provider about disease prevention and staying healthy. In between checkups, there are plenty of things you can do on your own. Experts have done a lot of research about which lifestyle changes and preventive measures are most likely to keep you healthy. Ask your health care provider for more information. Weight and diet Eat a healthy diet  Be sure to include plenty of vegetables, fruits, low-fat dairy products, and lean protein.  Do not eat a lot of foods high in solid fats, added sugars, or salt.  Get regular exercise. This is one of the most important things you can do for your health. ? Most adults should exercise for at least 150 minutes each week. The exercise should increase your heart rate and make you sweat (moderate-intensity exercise). ? Most adults should also do strengthening exercises at least twice a week. This is in addition to the moderate-intensity exercise.  Maintain a healthy weight  Body mass index (BMI) is a measurement that can be used to identify possible weight problems. It estimates body fat based on height and weight. Your health care provider can help determine your BMI and help you achieve or maintain a healthy weight.  For females 17 years of age and older: ? A BMI below 18.5 is considered underweight. ? A BMI of 18.5 to 24.9 is normal. ? A BMI of 25 to 29.9 is considered overweight. ? A BMI of 30 and above is considered obese.  Watch levels of cholesterol and blood lipids  You should start having your blood tested for lipids and cholesterol at 47 years of age, then have this test every 5  years.  You may need to have your cholesterol levels checked more often if: ? Your lipid or cholesterol levels are high. ? You are older than 47 years of age. ? You are at high risk for heart disease.  Cancer screening Lung Cancer  Lung cancer screening is recommended for adults 28-41 years old who are at high risk for lung cancer because of a history of smoking.  A yearly low-dose CT scan of the lungs is recommended for people who: ? Currently smoke. ? Have quit within the past 15 years. ? Have at least a 30-pack-year history of smoking. A pack year is smoking an average of one pack of cigarettes a day for 1 year.  Yearly screening should continue until it has been 15 years since you quit.  Yearly screening should stop if you develop a health problem that would prevent you from having lung cancer treatment.  Breast Cancer  Practice breast self-awareness. This means understanding how your breasts normally appear and feel.  It also means doing regular breast self-exams. Let your health care provider know about any changes, no matter how small.  If you are in your 20s or 30s, you should have a clinical breast exam (CBE) by a health care provider every 1-3 years as part of a regular health exam.  If you are 62 or older, have a CBE every year. Also consider having a breast X-ray (mammogram) every year.  If you have a family history of breast cancer, talk to your health care provider about genetic screening.  If you are at high risk for breast cancer, talk to your health care provider about having an MRI and a mammogram every year.  Breast cancer gene (BRCA) assessment is recommended for women who have family members with BRCA-related cancers. BRCA-related cancers include: ? Breast. ? Ovarian. ? Tubal. ? Peritoneal cancers.  Results of the assessment will determine the need for genetic counseling and BRCA1 and BRCA2 testing.  Cervical Cancer Your health care provider may  recommend that you be screened regularly for cancer of the pelvic organs (ovaries, uterus, and vagina). This screening involves a pelvic examination, including checking for microscopic changes to the surface of your cervix (Pap test). You may be encouraged to have this screening done every 3 years, beginning at age 72.  For women ages 102-65, health care providers may recommend pelvic exams and Pap testing every 3 years, or they may recommend the Pap and pelvic exam, combined with testing for human papilloma virus (HPV), every 5 years. Some types of HPV increase your risk of cervical cancer. Testing for HPV may also be done on women of any age with unclear Pap test results.  Other health care providers may not recommend any screening for nonpregnant women  who are considered low risk for pelvic cancer and who do not have symptoms. Ask your health care provider if a screening pelvic exam is right for you.  If you have had past treatment for cervical cancer or a condition that could lead to cancer, you need Pap tests and screening for cancer for at least 20 years after your treatment. If Pap tests have been discontinued, your risk factors (such as having a new sexual partner) need to be reassessed to determine if screening should resume. Some women have medical problems that increase the chance of getting cervical cancer. In these cases, your health care provider may recommend more frequent screening and Pap tests.  Colorectal Cancer  This type of cancer can be detected and often prevented.  Routine colorectal cancer screening usually begins at 47 years of age and continues through 47 years of age.  Your health care provider may recommend screening at an earlier age if you have risk factors for colon cancer.  Your health care provider may also recommend using home test kits to check for hidden blood in the stool.  A small camera at the end of a tube can be used to examine your colon directly  (sigmoidoscopy or colonoscopy). This is done to check for the earliest forms of colorectal cancer.  Routine screening usually begins at age 7.  Direct examination of the colon should be repeated every 5-10 years through 47 years of age. However, you may need to be screened more often if early forms of precancerous polyps or small growths are found.  Skin Cancer  Check your skin from head to toe regularly.  Tell your health care provider about any new moles or changes in moles, especially if there is a change in a mole's shape or color.  Also tell your health care provider if you have a mole that is larger than the size of a pencil eraser.  Always use sunscreen. Apply sunscreen liberally and repeatedly throughout the day.  Protect yourself by wearing long sleeves, pants, a wide-brimmed hat, and sunglasses whenever you are outside.  Heart disease, diabetes, and high blood pressure  High blood pressure causes heart disease and increases the risk of stroke. High blood pressure is more likely to develop in: ? People who have blood pressure in the high end of the normal range (130-139/85-89 mm Hg). ? People who are overweight or obese. ? People who are African American.  If you are 65-52 years of age, have your blood pressure checked every 3-5 years. If you are 33 years of age or older, have your blood pressure checked every year. You should have your blood pressure measured twice-once when you are at a hospital or clinic, and once when you are not at a hospital or clinic. Record the average of the two measurements. To check your blood pressure when you are not at a hospital or clinic, you can use: ? An automated blood pressure machine at a pharmacy. ? A home blood pressure monitor.  If you are between 58 years and 43 years old, ask your health care provider if you should take aspirin to prevent strokes.  Have regular diabetes screenings. This involves taking a blood sample to check your  fasting blood sugar level. ? If you are at a normal weight and have a low risk for diabetes, have this test once every three years after 47 years of age. ? If you are overweight and have a high risk for diabetes, consider being tested  at a younger age or more often. Preventing infection Hepatitis B  If you have a higher risk for hepatitis B, you should be screened for this virus. You are considered at high risk for hepatitis B if: ? You were born in a country where hepatitis B is common. Ask your health care provider which countries are considered high risk. ? Your parents were born in a high-risk country, and you have not been immunized against hepatitis B (hepatitis B vaccine). ? You have HIV or AIDS. ? You use needles to inject street drugs. ? You live with someone who has hepatitis B. ? You have had sex with someone who has hepatitis B. ? You get hemodialysis treatment. ? You take certain medicines for conditions, including cancer, organ transplantation, and autoimmune conditions.  Hepatitis C  Blood testing is recommended for: ? Everyone born from [birth year] through 1966. ? Anyone with known risk factors for hepatitis C.  Sexually transmitted infections (STIs)  You should be screened for sexually transmitted infections (STIs) including gonorrhea and chlamydia if: ? You are sexually active and are younger than 47 years of age. ? You are older than 48 years of age and your health care provider tells you that you are at risk for this type of infection. ? Your sexual activity has changed since you were last screened and you are at an increased risk for chlamydia or gonorrhea. Ask your health care provider if you are at risk.  If you do not have HIV, but are at risk, it may be recommended that you take a prescription medicine daily to prevent HIV infection. This is called pre-exposure prophylaxis (PrEP). You are considered at risk if: ? You are sexually active and do not regularly use condoms  or know the HIV status of your partner(s). ? You take drugs by injection. ? You are sexually active with a partner who has HIV.  Talk with your health care provider about whether you are at high risk of being infected with HIV. If you choose to begin PrEP, you should first be tested for HIV. You should then be tested every 3 months for as long as you are taking PrEP. Pregnancy  If you are premenopausal and you may become pregnant, ask your health care provider about preconception counseling.  If you may become pregnant, take 400 to 800 micrograms (mcg) of folic acid every day.  If you want to prevent pregnancy, talk to your health care provider about birth control (contraception). Osteoporosis and menopause  Osteoporosis is a disease in which the bones lose minerals and strength with aging. This can result in serious bone fractures. Your risk for osteoporosis can be identified using a bone density scan.  If you are 63 years of age or older, or if you are at risk for osteoporosis and fractures, ask your health care provider if you should be screened.  Ask your health care provider whether you should take a calcium or vitamin D supplement to lower your risk for osteoporosis.  Menopause may have certain physical symptoms and risks.  Hormone replacement therapy may reduce some of these symptoms and risks. Talk to your health care provider about whether hormone replacement therapy is right for you. Follow these instructions at home:  Schedule regular health, dental, and eye exams.  Stay current with your immunizations.  Do not use any tobacco products including cigarettes, chewing tobacco, or electronic cigarettes.  If you are pregnant, do not drink alcohol.  If you are breastfeeding, limit  how much and how often you drink alcohol.  Limit alcohol intake to no more than 1 drink per day for nonpregnant women. One drink equals 12 ounces of beer, 5 ounces of wine, or 1 ounces of hard  liquor.  Do not use street drugs.  Do not share needles.  Ask your health care provider for help if you need support or information about quitting drugs.  Tell your health care provider if you often feel depressed.  Tell your health care provider if you have ever been abused or do not feel safe at home. This information is not intended to replace advice given to you by your health care provider. Make sure you discuss any questions you have with your health care provider. Document Released: 12/26/2010 Document Revised: 11/18/2015 Document Reviewed: 03/16/2015 Elsevier Interactive Patient Education  [employer].

## 2017-02-08 NOTE — Progress Notes (Signed)
Reviewed in PCP absence and agree. KIM, HANNAH R., DO  

## 2017-02-08 NOTE — Telephone Encounter (Signed)
That is appropriate 

## 2017-02-09 ENCOUNTER — Telehealth: Payer: Self-pay

## 2017-02-09 NOTE — Telephone Encounter (Signed)
Ok

## 2017-02-09 NOTE — Telephone Encounter (Signed)
Call to the patient regarding A1c 7.0 and to cut back on white sugary foods with high glycemic load as we discussed yesterday. The patient has a scheduled appointment with Dr. Salomon Fick to discuss ongoing care.

## 2017-02-16 ENCOUNTER — Ambulatory Visit: Payer: Medicare Other | Admitting: Family Medicine

## 2017-02-16 ENCOUNTER — Encounter: Payer: Self-pay | Admitting: Family Medicine

## 2017-02-16 ENCOUNTER — Ambulatory Visit (INDEPENDENT_AMBULATORY_CARE_PROVIDER_SITE_OTHER): Payer: Medicare Other | Admitting: Family Medicine

## 2017-02-16 VITALS — BP 124/74 | HR 95 | Temp 98.1°F | Ht 65.0 in | Wt 239.2 lb

## 2017-02-16 DIAGNOSIS — E782 Mixed hyperlipidemia: Secondary | ICD-10-CM

## 2017-02-16 DIAGNOSIS — Z23 Encounter for immunization: Secondary | ICD-10-CM

## 2017-02-16 DIAGNOSIS — E7800 Pure hypercholesterolemia, unspecified: Secondary | ICD-10-CM

## 2017-02-16 DIAGNOSIS — F411 Generalized anxiety disorder: Secondary | ICD-10-CM

## 2017-02-16 DIAGNOSIS — E1140 Type 2 diabetes mellitus with diabetic neuropathy, unspecified: Secondary | ICD-10-CM | POA: Diagnosis not present

## 2017-02-16 NOTE — Patient Instructions (Addendum)
Diabetes Mellitus and Exercise Exercising regularly is important for your overall health, especially when you have diabetes (diabetes mellitus). Exercising is not only about losing weight. It has many health benefits, such as increasing muscle strength and bone density and reducing body fat and stress. This leads to improved fitness, flexibility, and endurance, all of which result in better overall health. Exercise has additional benefits for people with diabetes, including:  Reducing appetite.  Helping to lower and control blood glucose.  Lowering blood pressure.  Helping to control amounts of fatty substances (lipids) in the blood, such as cholesterol and triglycerides.  Helping the body to respond better to insulin (improving insulin sensitivity).  Reducing how much insulin the body needs.  Decreasing the risk for heart disease by: ? Lowering cholesterol and triglyceride levels. ? Increasing the levels of good cholesterol. ? Lowering blood glucose levels.  What is my activity plan? Your health care provider or certified diabetes educator can help you make a plan for the type and frequency of exercise (activity plan) that works for you. Make sure that you:  Do at least 150 minutes of moderate-intensity or vigorous-intensity exercise each week. This could be brisk walking, biking, or water aerobics. ? Do stretching and strength exercises, such as yoga or weightlifting, at least 2 times a week. ? Spread out your activity over at least 3 days of the week.  Get some form of physical activity every day. ? Do not go more than 2 days in a row without some kind of physical activity. ? Avoid being inactive for more than 90 minutes at a time. Take frequent breaks to walk or stretch.  Choose a type of exercise or activity that you enjoy, and set realistic goals.  Start slowly, and gradually increase the intensity of your exercise over time.  What do I need to know about managing my  diabetes?  Check your blood glucose before and after exercising. ? If your blood glucose is higher than 240 mg/dL (13.3 mmol/L) before you exercise, check your urine for ketones. If you have ketones in your urine, do not exercise until your blood glucose returns to normal.  Know the symptoms of low blood glucose (hypoglycemia) and how to treat it. Your risk for hypoglycemia increases during and after exercise. Common symptoms of hypoglycemia can include: ? Hunger. ? Anxiety. ? Sweating and feeling clammy. ? Confusion. ? Dizziness or feeling light-headed. ? Increased heart rate or palpitations. ? Blurry vision. ? Tingling or numbness around the mouth, lips, or tongue. ? Tremors or shakes. ? Irritability.  Keep a rapid-acting carbohydrate snack available before, during, and after exercise to help prevent or treat hypoglycemia.  Avoid injecting insulin into areas of the body that are going to be exercised. For example, avoid injecting insulin into: ? The arms, when playing tennis. ? The legs, when jogging.  Keep records of your exercise habits. Doing this can help you and your health care provider adjust your diabetes management plan as needed. Write down: ? Food that you eat before and after you exercise. ? Blood glucose levels before and after you exercise. ? The type and amount of exercise you have done. ? When your insulin is expected to peak, if you use insulin. Avoid exercising at times when your insulin is peaking.  When you start a new exercise or activity, work with your health care provider to make sure the activity is safe for you, and to adjust your insulin, medicines, or food intake as needed.    Drink plenty of water while you exercise to prevent dehydration or heat stroke. Drink enough fluid to keep your urine clear or pale yellow. This information is not intended to replace advice given to you by your health care provider. Make sure you discuss any questions you have with  your health care provider. Document Released: 09/02/2003 Document Revised: 12/31/2015 Document Reviewed: 11/22/2015 Elsevier Interactive Patient Education  2018 [employer]. Diabetes Mellitus and Food It is important for you to manage your blood sugar (glucose) level. Your blood glucose level can be greatly affected by what you eat. Eating healthier foods in the appropriate amounts throughout the day at about the same time each day will help you control your blood glucose level. It can also help slow or prevent worsening of your diabetes mellitus. Healthy eating may even help you improve the level of your blood pressure and reach or maintain a healthy weight. General recommendations for healthful eating and cooking habits include:  Eating meals and snacks regularly. Avoid going long periods of time without eating to lose weight.  Eating a diet that consists mainly of plant-based foods, such as fruits, vegetables, nuts, legumes, and whole grains.  Using low-heat cooking methods, such as baking, instead of high-heat cooking methods, such as deep frying.  Work with your dietitian to make sure you understand how to use the Nutrition Facts information on food labels. How can food affect me? Carbohydrates Carbohydrates affect your blood glucose level more than any other type of food. Your dietitian will help you determine how many carbohydrates to eat at each meal and teach you how to count carbohydrates. Counting carbohydrates is important to keep your blood glucose at a healthy level, especially if you are using insulin or taking certain medicines for diabetes mellitus. Alcohol Alcohol can cause sudden decreases in blood glucose (hypoglycemia), especially if you use insulin or take certain medicines for diabetes mellitus. Hypoglycemia can be a life-threatening condition. Symptoms of hypoglycemia (sleepiness, dizziness, and disorientation) are similar to symptoms of having too much alcohol. If your  health care provider has given you approval to drink alcohol, do so in moderation and use the following guidelines:  Women should not have more than one drink per day, and men should not have more than two drinks per day. One drink is equal to: ? 12 oz of beer. ? 5 oz of wine. ? 1 oz of hard liquor.  Do not drink on an empty stomach.  Keep yourself hydrated. Have water, diet soda, or unsweetened iced tea.  Regular soda, juice, and other mixers might contain a lot of carbohydrates and should be counted.  What foods are not recommended? As you make food choices, it is important to remember that all foods are not the same. Some foods have fewer nutrients per serving than other foods, even though they might have the same number of calories or carbohydrates. It is difficult to get your body what it needs when you eat foods with fewer nutrients. Examples of foods that you should avoid that are high in calories and carbohydrates but low in nutrients include:  Trans fats (most processed foods list trans fats on the Nutrition Facts label).  Regular soda.  Juice.  Candy.  Sweets, such as cake, pie, doughnuts, and cookies.  Fried foods.  What foods can I eat? Eat nutrient-rich foods, which will nourish your body and keep you healthy. The food you should eat also will depend on several factors, including:  The calories you need.  The medicines you take.  Your weight.  Your blood glucose level.  Your blood pressure level.  Your cholesterol level.  You should eat a variety of foods, including:  Protein. ? Lean cuts of meat. ? Proteins low in saturated fats, such as fish, egg whites, and beans. Avoid processed meats.  Fruits and vegetables. ? Fruits and vegetables that may help control blood glucose levels, such as apples, mangoes, and yams.  Dairy products. ? Choose fat-free or low-fat dairy products, such as milk, yogurt, and cheese.  Grains, bread, pasta, and  rice. ? Choose whole grain products, such as multigrain bread, whole oats, and [place]one rice. These foods may help control blood pressure.  Fats. ? Foods containing healthful fats, such as nuts, avocado, olive oil, canola oil, and fish.  Does everyone with diabetes mellitus have the same meal plan? Because every person with diabetes mellitus is different, there is not one meal plan that works for everyone. It is very important that you meet with a dietitian who will help you create a meal plan that is just right for you. This information is not intended to replace advice given to you by your health care provider. Make sure you discuss any questions you have with your health care provider. Document Released: 03/09/2005 Document Revised: 11/18/2015 Document Reviewed: 05/09/2013 Elsevier Interactive Patient Education  2017 [employer]   At today's visit we discussed trying to find a water aerobics class.  This would be a great option to help you loose weight and to help with your pain.  We also discussed keeping a food diary.  You should write down everything you eat and drink each day.   It is important that you continue to follow up with your other healthcare providers regularly.
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
## 2017-02-21 ENCOUNTER — Encounter: Payer: Self-pay | Admitting: Endocrinology

## 2017-02-21 ENCOUNTER — Ambulatory Visit (INDEPENDENT_AMBULATORY_CARE_PROVIDER_SITE_OTHER): Payer: Medicare Other | Admitting: Endocrinology

## 2017-02-21 VITALS — BP 104/68 | HR 93 | Wt 240.2 lb

## 2017-02-21 DIAGNOSIS — E1140 Type 2 diabetes mellitus with diabetic neuropathy, unspecified: Secondary | ICD-10-CM

## 2017-02-21 LAB — POCT GLYCOSYLATED HEMOGLOBIN (HGB A1C): HEMOGLOBIN A1C: 6.6

## 2017-02-21 MED ORDER — COLESEVELAM HCL 625 MG PO TABS: 625.0000 mg | ORAL_TABLET | Freq: Every day | ORAL | 11 refills | Status: DC
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
## 2017-02-21 NOTE — Patient Instructions (Addendum)
check your blood sugar once a day.  vary the time of day when you check, between before the 3 meals, and at bedtime.  also check if you have symptoms of your blood sugar being too high or too low.  please keep a record of the readings and bring it to your next appointment here (or you can bring the meter itself).  You can write it on any piece of paper.  please call us sooner if your blood sugar goes below 70, or if you have a lot of readings over 200.  Here is a new meter. I have sent a prescription to your pharmacy, to add "welchol." Please come back for a follow-up appointment in 3 months     Bariatric Surgery You have so much to gain by losing weight.  You may have already tried every diet and exercise plan imaginable.  And, you may have sought advice from your family physician, too.   Sometimes, in spite of such diligent efforts, you may not be able to achieve long-term results by yourself.  In cases of severe obesity, bariatric or weight loss surgery is a proven method of achieving long-term weight control.  Our Services Our bariatric surgery programs offer our patients new hope and long-term weight-loss solution.  Since introducing our services in 2003, we have conducted more than 2,400 successful procedures.  Our program is designated as a Investment banker, corporate by the Metabolic and Bariatric Surgery Accreditation and Quality Improvement Program (MBSAQIP), a Child psychotherapist that sets rigorous patient safety and outcome standards.  Our program is also designated as a Engineer, manufacturing systems by [employer].   Our exceptional weight-loss surgery team specializes in diagnosis, treatment, follow-up care, and ongoing support for our patients with severe weight loss challenges.  We currently offer laparoscopic sleeve gastrectomy, gastric bypass, and adjustable gastric band (LAP-BAND).    Attend our Bariatrics Seminar Choosing to undergo a bariatric procedure is a big decision, and  one that should not be taken lightly.  You now have two options in how you learn about weight-loss surgery - in person or online.  Our objective is to ensure you have all of the information that you need to evaluate the advantages and obligations of this life changing procedure.  Please note that you are not alone in this process, and our experienced team is ready to assist and answer all of your questions.  There are several ways to register for a seminar (either on-line or in person): 1)  Call [phone] 2) Go on-line to [facility] and register for either type of seminar.  [email or web address]

## 2017-02-28 ENCOUNTER — Other Ambulatory Visit: Payer: Self-pay | Admitting: Family Medicine

## 2017-03-15 ENCOUNTER — Encounter: Payer: Self-pay | Admitting: Family Medicine

## 2017-03-16 ENCOUNTER — Ambulatory Visit (INDEPENDENT_AMBULATORY_CARE_PROVIDER_SITE_OTHER): Payer: Medicare Other | Admitting: Family Medicine

## 2017-03-16 ENCOUNTER — Encounter: Payer: Self-pay | Admitting: Family Medicine

## 2017-03-16 VITALS — BP 100/70 | HR 85 | Temp 98.2°F | Wt 240.2 lb

## 2017-03-16 DIAGNOSIS — J00 Acute nasopharyngitis [common cold]: Secondary | ICD-10-CM | POA: Diagnosis not present

## 2017-03-16 DIAGNOSIS — E1140 Type 2 diabetes mellitus with diabetic neuropathy, unspecified: Secondary | ICD-10-CM

## 2017-03-16 NOTE — Patient Instructions (Addendum)
Upper Respiratory Infection, Adult Most upper respiratory infections (URIs) are caused by a virus. A URI affects the nose, throat, and upper air passages. The most common type of URI is often called "the common cold." Follow these instructions at home:  Take medicines only as told by your doctor.  Gargle warm saltwater or take cough drops to comfort your throat as told by your doctor.  Use a warm mist humidifier or inhale steam from a shower to increase air moisture. This may make it easier to breathe.  Drink enough fluid to keep your pee (urine) clear or pale yellow.  Eat soups and other clear broths.  Have a healthy diet.  Rest as needed.  Go back to work when your fever is gone or your doctor says it is okay. ? You may need to stay home longer to avoid giving your URI to others. ? You can also wear a face mask and wash your hands often to prevent spread of the virus.  Use your inhaler more if you have asthma.  Do not use any tobacco products, including cigarettes, chewing tobacco, or electronic cigarettes. If you need help quitting, ask your doctor. Contact a doctor if:  You are getting worse, not better.  Your symptoms are not helped by medicine.  You have chills.  You are getting more short of breath.  You have Scibilia or red mucus.  You have yellow or Hod[place]on discharge from your nose.  You have pain in your face, especially when you bend forward.  You have a fever.  You have puffy (swollen) neck glands.  You have pain while swallowing.  You have white areas in the back of your throat. Get help right away if:  You have very bad or constant: ? Headache. ? Ear pain. ? Pain in your forehead, behind your eyes, and over your cheekbones (sinus pain). ? Chest pain.  You have long-lasting (chronic) lung disease and any of the following: ? Wheezing. ? Long-lasting cough. ? Coughing up blood. ? A change in your usual mucus.  You have a stiff neck.  You have  changes in your: ? Vision. ? Hearing. ? Thinking. ? Mood. This information is not intended to replace advice given to you by your health care provider. Make sure you discuss any questions you have with your health care provider. Document Released: 11/29/2007 Document Revised: 02/13/2016 Document Reviewed: 09/17/2013 Elsevier Interactive Patient Education  2018 Elsevier Inc.  

## 2017-03-16 NOTE — Progress Notes (Signed)
Subjective:    Patient ID: [name], female    DOB: 09/22/1970, 45 y.o.   MRN: [id number]  No chief complaint on file.   HPI Patient was seen today for acute concern. Patient with three-day history of nasal congestion, ear pressure, headache. Patient endorses taking Claritin. Patient forgot she had Flonase and has not been using it. Sick contacts include patient's mother who has an upper respiratory infection. Patient denies nausea, vomiting, chills, diarrhea, cough.  Patient also mentions her blood sugar has been high. States after eating cereal this a.m. blood sugar was 250. States another day her blood sugar was 175. Patient endorses drinking diet sodas, juice but little to no water. Patient denies blurred vision, nausea, increased thirst.  Patient does not have glucometer or fsbs log with her.   Allergies  Allergen Reactions  . Macrobid [employer] Macro] Other (See Comments)    "Drains fluids out and can not walk" requiring hospitalization  . Nitrofuran Derivatives     Other reaction(s): Other paralysis  . Sulfa Antibiotics Nausea And Vomiting  . Brexpiprazole Other (See Comments)    Jittery, nervous  . Codeine Nausea And Vomiting  . Doxepin Other (See Comments)    Irritated IC  . Doxycycline     "doesn't make me feel good"  . Duloxetine Other (See Comments)    interacted with antidepressant  . Gabapentin Rash    Severe itching in vaginal area  . Pregabalin Rash    ROS General: Denies fever, chills, night sweats, changes in weight, changes in appetite HEENT: Denies ear pain, changes in vision   +HA, rhinorrhea, sore throat, nasal congestion, ear fullness CV: Denies CP, palpitations, SOB, orthopnea Pulm: Denies SOB, cough    +wheezing GI: Denies abdominal pain, nausea, vomiting, diarrhea, constipation GU: Denies dysuria, hematuria, frequency, vaginal discharge Msk: Denies muscle cramps, joint pains Neuro: Denies weakness, numbness, tingling Skin: Denies  rashes, bruising Psych: Denies depression, anxiety, hallucinations  +anxiety     Objective:    Blood pressure 100/70, pulse 85, temperature 98.2 F (36.8 C), temperature source Oral, weight 240 lb 3.2 oz (109 kg), last menstrual period 11/12/2010, SpO2 95 %.   Gen. Pleasant, well-nourished, in no distress, normal affect  HEENT: Welch/AT, face symmetric, no scleral icterus, PERRLA, nares patent with clear drainage, pharynx with mild erythema and post nasal drainage. no exudate. Lungs: no accessory muscle use, CTAB, no wheezes or rales Cardiovascular: RRR, no m/r/g, no peripheral edema Abdomen: soft and non-tender, no hepatosplenomegaly, BS normal. Neuro:  A&Ox3, CN II-XII intact, normal gait Skin:  Warm, well healed hypopigmented lesions on shoulders and arms possibly 2/2 picking   Wt Readings from Last 3 Encounters:  03/16/17 240 lb 3.2 oz (109 kg)  02/21/17 240 lb 3.2 oz (109 kg)  02/16/17 239 lb 3.2 oz (108.5 kg)    Assessment/Plan:  Acute nasopharyngitis -Supportive care -Obtained to gargle with warm salt water or Chloraseptic spray. -Encouraged to stay hydrated. -Encouraged to continue Claritin and use Flonase. -Patient states unable to use decongestant medications. -Handwashing encouraged. -Given RTC precautions.  F/u prn.  Type 2 diabetes mellitus with diabetic neuropathy, without long-term current use of insulin (HCC) -last hgb A1C 6.6% on 02/21/17 -No medication changes made. -pt encouraged to increase po intake of water. -continue fsbs and keep log.  Bring log or meter to each [facility]. -Patient is to continue metformin xr  daily , Onglyza 2.5 mg tablet -f/u in next few months

## 2017-03-18 ENCOUNTER — Telehealth: Payer: Medicare Other | Admitting: Family

## 2017-03-18 DIAGNOSIS — J019 Acute sinusitis, unspecified: Secondary | ICD-10-CM

## 2017-03-18 MED ORDER — AMOXICILLIN-POT CLAVULANATE 875-125 MG PO TABS: 1.0000 | ORAL_TABLET | Freq: Two times a day (BID) | ORAL | 0 refills | Status: DC

## 2017-03-18 NOTE — Progress Notes (Signed)
We are sorry that you are not feeling well.  Here is how we plan to help!  Since your symptoms have worsen over the last 6 day and since seeing your PCP we will treat. See below.   Based on what you have shared with me it looks like you have sinusitis.  Sinusitis is inflammation and infection in the sinus cavities of the head.  Based on your presentation I believe you most likely have Acute Bacterial Sinusitis.  This is an infection caused by bacteria and is treated with antibiotics. I have prescribed Augmentin /125mg  one tablet twice daily with food, for 7 days. You may use an oral decongestant such as Mucinex D or if you have glaucoma or high blood pressure use plain Mucinex. Saline nasal spray help and can safely be used as often as needed for congestion.  If you develop worsening sinus pain, fever or notice severe headache and vision changes, or if symptoms are not better after completion of antibiotic, please schedule an appointment with a health care provider.    Sinus infections are not as easily transmitted as other respiratory infection, however we still recommend that you avoid close contact with loved ones, especially the very young and elderly.  Remember to wash your hands thoroughly throughout the day as this is the number one way to prevent the spread of infection!  Home Care:  Only take medications as instructed by your medical team.  Complete the entire course of an antibiotic.  Do not take these medications with alcohol.  A steam or ultrasonic humidifier can help congestion.  You can place a towel over your head and breathe in the steam from hot water coming from a faucet.  Avoid close contacts especially the very young and the elderly.  Cover your mouth when you cough or sneeze.  Always remember to wash your hands.  Get Help Right Away If:  You develop worsening fever or sinus pain.  You develop a severe head ache or visual changes.  Your symptoms persist after  you have completed your treatment plan.  Make sure you  Understand these instructions.  Will watch your condition.  Will get help right away if you are not doing well or get worse.  Your e-visit answers were reviewed by a board certified advanced clinical practitioner to complete your personal care plan.  Depending on the condition, your plan could have included both over the counter or prescription medications.  If there is a problem please reply  once you have received a response from your provider.  Your safety is important to [place].  If you have drug allergies check your prescription carefully.    You can use MyChart to ask questions about today's visit, request a non-urgent call back, or ask for a work or school excuse for 24 hours related to this e-Visit. If it has been greater than 24 hours you will need to follow up with your provider, or enter a new e-Visit to address those concerns.  You will get an e-mail in the next two days asking about your experience.  I hope that your e-visit has been valuable and will speed your recovery. Thank you for using e-visits.

## 2017-03-20 ENCOUNTER — Encounter: Payer: Self-pay | Admitting: Family Medicine

## 2017-03-21 ENCOUNTER — Other Ambulatory Visit: Payer: Self-pay | Admitting: Emergency Medicine

## 2017-03-21 MED ORDER — FLUCONAZOLE 150 MG PO TABS: 150.0000 mg | ORAL_TABLET | Freq: Every day | ORAL | 0 refills | Status: DC

## 2017-03-27 ENCOUNTER — Encounter: Payer: Self-pay | Admitting: Endocrinology

## 2017-03-27 ENCOUNTER — Other Ambulatory Visit: Payer: Self-pay | Admitting: Endocrinology

## 2017-03-27 MED ORDER — GLIMEPIRIDE 4 MG PO TABS: 4.0000 mg | ORAL_TABLET | Freq: Every day | ORAL | 0 refills | Status: DC

## 2017-03-28 ENCOUNTER — Telehealth: Payer: Self-pay | Admitting: Endocrinology

## 2017-03-28 NOTE — Telephone Encounter (Signed)
Patient feels that she needs earlier appointment than Tuesday with Dr. Everardo All. Please call

## 2017-03-29 ENCOUNTER — Ambulatory Visit (INDEPENDENT_AMBULATORY_CARE_PROVIDER_SITE_OTHER)
Admission: RE | Admit: 2017-03-29 | Discharge: 2017-03-29 | Disposition: A | Discharge status: Home / Self Care | Payer: Medicare Other | Source: Ambulatory Visit | Attending: Family Medicine | Admitting: Family Medicine

## 2017-03-29 ENCOUNTER — Ambulatory Visit (INDEPENDENT_AMBULATORY_CARE_PROVIDER_SITE_OTHER): Payer: Medicare Other | Admitting: Family Medicine

## 2017-03-29 ENCOUNTER — Encounter: Payer: Self-pay | Admitting: Family Medicine

## 2017-03-29 VITALS — BP 108/80 | HR 86 | Temp 98.4°F | Wt 238.9 lb

## 2017-03-29 DIAGNOSIS — R05 Cough: Secondary | ICD-10-CM | POA: Diagnosis not present

## 2017-03-29 DIAGNOSIS — R059 Cough, unspecified: Secondary | ICD-10-CM

## 2017-03-29 DIAGNOSIS — J988 Other specified respiratory disorders: Secondary | ICD-10-CM

## 2017-03-29 NOTE — Telephone Encounter (Signed)
Spoke with patient regarding x-ray per Dr. Salomon Fick x ray showed no infection and it looks to be that patient has a viral infection that has to runs its course. Patient understood and had no further questions.

## 2017-03-29 NOTE — Telephone Encounter (Signed)
Called patient but no answer & no VM set up to leave message.

## 2017-03-29 NOTE — Patient Instructions (Addendum)
Please go to the [place] [facility] on [facility] for your chest xray.   Cough, Adult A cough helps to clear your throat and lungs. A cough may last only 2-3 weeks (acute), or it may last longer than 8 weeks (chronic). Many different things can cause a cough. A cough may be a sign of an illness or another medical condition. Follow these instructions at home:  Pay attention to any changes in your cough.  Take medicines only as told by your doctor. ? If you were prescribed an antibiotic medicine, take it as told by your doctor. Do not stop taking it even if you start to feel better. ? Talk with your doctor before you try using a cough medicine.  Drink enough fluid to keep your pee (urine) clear or pale yellow.  If the air is dry, use a cold steam vaporizer or humidifier in your home.  Stay away from things that make you cough at work or at home.  If your cough is worse at night, try using extra pillows to raise your head up higher while you sleep.  Do not smoke, and try not to be around smoke. If you need help quitting, ask your doctor.  Do not have caffeine.  Do not drink alcohol.  Rest as needed. Contact a doctor if:  You have new problems (symptoms).  You cough up yellow fluid (pus).  Your cough does not get better after 2-3 weeks, or your cough gets worse.  Medicine does not help your cough and you are not sleeping well.  You have pain that gets worse or pain that is not helped with medicine.  You have a fever.  You are losing weight and you do not know why.  You have night sweats. Get help right away if:  You cough up blood.  You have trouble breathing.  Your heartbeat is very fast. This information is not intended to replace advice given to you by your health care provider. Make sure you discuss any questions you have with your health care provider. Document Released: 02/23/2011 Document Revised: 11/18/2015 Document Reviewed: 08/19/2014 Elsevier Interactive Patient  Education  [employer].

## 2017-03-29 NOTE — Progress Notes (Signed)
Subjective:    Patient ID: [name], female    DOB: 14-May-1971, 46 y.o.   MRN: [id number]  No chief complaint on file.   HPI Patient was seen today for ongoing concern.  Patient stated after being seen on 9/21 for 3 days of nasal congestion, that Saturday she developed a fever Tmax 101.4. She then had an e-visit on 9/23 for which Augmentin was prescribed. Patient states she continues to have congestion, productive cough, but no fevers. She states she has taken Claritin and Flonase.   Patient also notes increase in blood sugar. This a.m. at this BS was 170, the highest was 385. Patient states she has been drinking 3 bottles of water per day, tea, or issues. This morning she had [place] toast for breakfast. She is also been eating protein bars with 16 g of carbs. Patient has been in contact with endocrinology office, she was started on a new medicine. Pt has an appointment on Tuesday.   Outpatient Medications Prior to Visit  Medication Sig Dispense Refill  . amoxicillin-clavulanate (AUGMENTIN) 875-125 MG tablet Take 1 tablet by mouth 2 (two) times daily. 14 tablet 0  . baclofen (LIORESAL) 20 MG tablet Take 20 mg by mouth 2 (two) times daily.     . clonazePAM (KLONOPIN) 1 MG tablet Take 1 mg by mouth 3 (three) times daily.     . colesevelam (WELCHOL) 625 MG tablet Take 1 tablet (625 mg total) by mouth daily. 30 tablet 11  . fluconazole (DIFLUCAN) 150 MG tablet Take 1 tablet (150 mg total) by mouth daily. 1 tablet 0  . FLUoxetine (PROZAC) 20 MG tablet Take 20 mg by mouth.    . fluticasone (FLONASE) 50 MCG/ACT nasal spray PLACE 1 SPRAY INTO BOTH NOSTRILS 2 TIMES DAILY 48 g 0  . glimepiride (AMARYL) 4 MG tablet Take 1 tablet (4 mg total) by mouth daily. 30 tablet 0  . glucose blood (BAYER CONTOUR TEST) test strip 1 each by Other route daily. And lancets 1/day 100 each 3  . HYDROcodone-acetaminophen (NORCO) 5-325 MG tablet Take by mouth.    . hydrocortisone (ANUSOL-HC) 2.5 % rectal cream Place 1  application rectally daily as needed for itching.     . hydrOXYzine (VISTARIL) 50 MG capsule TAKE ONE CAPSULE BY MOUTH TWICE A DAY AS NEEDED 60 capsule 3  . Lancets (FREESTYLE) lancets Use 1 lancets per test to test blood sugar 2 times daily. 100 each 3  . lidocaine (LIDODERM) 5 % Remove & Discard patch within 12 hours or as directed by MD    . lidocaine (XYLOCAINE) 5 % ointment Apply daily to urethra    . metFORMIN (GLUCOPHAGE-XR) 500 MG 24 hr tablet Take 2 tablets (1,000 mg total) by mouth daily with breakfast. 60 tablet 11  . OVER THE COUNTER MEDICATION Take 2 tablets by mouth daily as needed (IC flare ups). cystoprotek    . pentosan polysulfate (ELMIRON) 100 MG capsule Take 100 mg by mouth 3 (three) times daily.    . polyethylene glycol (MIRALAX / GLYCOLAX) packet Take 17 g by mouth daily. 14 each 0  . pravastatin (PRAVACHOL) 40 MG tablet TAKE 1 TABLET EVERY DAY 90 tablet 1  . ranitidine (ZANTAC) 150 MG tablet Take 150-300 mg by mouth daily as needed for heartburn.     . saxagliptin HCl (ONGLYZA) 2.5 MG TABS tablet Take 1 tablet (2.5 mg total) by mouth daily. 30 tablet 11  . Suppository Base MISC 1 capsule by Does not apply route.  As needed    . traZODone (DESYREL) 50 MG tablet   0  . triamcinolone ointment (KENALOG) 0.1 % Apply 1 application topically daily as needed (rash).      No facility-administered medications prior to visit.     Allergies  Allergen Reactions  . Macrobid [employer] Macro] Other (See Comments)    "Drains fluids out and can not walk" requiring hospitalization  . Nitrofuran Derivatives     Other reaction(s): Other paralysis  . Sulfa Antibiotics Nausea And Vomiting  . Brexpiprazole Other (See Comments)    Jittery, nervous  . Codeine Nausea And Vomiting  . Doxepin Other (See Comments)    Irritated IC  . Doxycycline     "doesn't make me feel good"  . Duloxetine Other (See Comments)    interacted with antidepressant  . Gabapentin Rash    Severe  itching in vaginal area  . Pregabalin Rash    ROS General: Denies fever, chills, night sweats, changes in weight, changes in appetite HEENT: Denies headaches, ear pain, changes in vision, rhinorrhea, sore throat   +nasal congestion CV: Denies CP, palpitations, SOB, orthopnea Pulm: Denies SOB, cough, wheezing  +productive cough with green sputum. GI: Denies abdominal pain, nausea, vomiting, diarrhea, constipation GU: Denies dysuria, hematuria, frequency, vaginal discharge Msk: Denies muscle cramps, joint pains Neuro: Denies weakness, numbness, tingling Skin: Denies rashes, bruising Psych: Denies depression, anxiety, hallucinations     Objective:    Blood pressure 108/80, pulse 86, temperature 98.4 F (36.9 C), weight 238 lb 14.4 oz (108.4 kg), last menstrual period 11/12/2010, SpO2 98 %.   Gen. Pleasant, well-nourished, in no distress, normal affect   HEENT: [place]/AT, face symmetric, no scleral icterus, PERRLA, EOMI, nares patent without drainage, pharynx without erythema or exudate. Lungs: Not coughing at this time. No accessory muscle use, mild bibasilar rhonchi and wheezing Cardiovascular: RR, heart sounds  normal, no m/r/g, no peripheral edema Skin:  Warm, hypopigmented lesions on upper UEs, some new areas with fresh blood  Assessment/Plan:  Cough  -discussed causes of cough. -given PE findings and hx, will send for CXR. -Based on CXR findings will give rx for abx prn. -Plan: DG Chest 2 View  Respiratory tract infection -Discussed supportive care -Discussed increasing by mouth intake of fluids.  DM -Patient has follow-up with endocrinology on Tuesday -Advised to increase po intake of water, reminded of appropriate dietary choices. -Pt given RTC/ED precautions

## 2017-04-04 ENCOUNTER — Encounter: Payer: Self-pay | Admitting: Family Medicine

## 2017-04-26 ENCOUNTER — Ambulatory Visit (INDEPENDENT_AMBULATORY_CARE_PROVIDER_SITE_OTHER): Payer: Medicare Other | Admitting: Family Medicine

## 2017-04-26 ENCOUNTER — Encounter: Payer: Self-pay | Admitting: Family Medicine

## 2017-04-26 VITALS — BP 102/80 | HR 90 | Temp 98.6°F | Wt 235.7 lb

## 2017-04-26 DIAGNOSIS — E1140 Type 2 diabetes mellitus with diabetic neuropathy, unspecified: Secondary | ICD-10-CM | POA: Diagnosis not present

## 2017-04-26 DIAGNOSIS — F341 Dysthymic disorder: Secondary | ICD-10-CM
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
## 2017-04-26 NOTE — Patient Instructions (Addendum)
Type 2 Diabetes Mellitus, Self Care, Adult When you have type 2 diabetes (type 2 diabetes mellitus), you must keep your blood sugar (glucose) under control. You can do this with:  Nutrition.  Exercise.  Lifestyle changes.  Medicines or insulin, if needed.  Support from your doctors and others.  How do I manage my blood sugar?  Check your blood sugar level every day, as often as told.  Call your doctor if your blood sugar is above your goal numbers for 2 tests in a row.  Have your A1c (hemoglobin A1c) level checked at least two times a year. Have it checked more often if your doctor tells you to. Your doctor will set treatment goals for you. Generally, you should have these blood sugar levels:  Before meals (preprandial): 80-130 mg/dL (4.4-7.2 mmol/L).  After meals (postprandial): lower than 180 mg/dL (10 mmol/L).  A1c level: less than 7%.  What do I need to know about high blood sugar? High blood sugar is called hyperglycemia. Know the signs of high blood sugar. Signs may include:  Feeling: ? Thirsty. ? Hungry. ? Very tired.  Needing to pee (urinate) more than usual.  Blurry vision.  What do I need to know about low blood sugar? Low blood sugar is called hypoglycemia. This is when blood sugar is at or below 70 mg/dL (3.9 mmol/L). Symptoms may include:  Feeling: ? Hungry. ? Worried or nervous (anxious). ? Sweaty and clammy. ? Confused. ? Dizzy. ? Sleepy. ? Sick to your stomach (nauseous).  Having: ? A fast heartbeat (palpitations). ? A headache. ? A change in your vision. ? Jerky movements that you cannot control (seizure). ? Nightmares. ? Tingling or no feeling (numbness) around the mouth, lips, or tongue.  Having trouble with: ? Talking. ? Paying attention (concentrating). ? Moving (coordination). ? Sleeping.  Shaking.  Passing out (fainting).  Getting upset easily (irritability).  Treating low blood sugar  To treat low blood sugar, eat or  drink something sugary right away. If you can think clearly and swallow safely, follow the 15:15 rule:  Take 15 grams of a fast-acting carb (carbohydrate). Some fast-acting carbs are: ? 1 tube of glucose gel. ? 3 sugar tablets (glucose pills). ? 6-8 pieces of hard candy. ? 4 oz (120 mL) of fruit juice. ? 4 oz (120 mL) regular (not diet) soda.  Check your blood sugar 15 minutes after you take the carb.  If your blood sugar is still at or below 70 mg/dL (3.9 mmol/L), take 15 grams of a carb again.  If your blood sugar does not go above 70 mg/dL (3.9 mmol/L) after 3 tries, get help right away.  After your blood sugar goes back to normal, eat a meal or a snack within 1 hour.  Treating very low blood sugar If your blood sugar is at or below 54 mg/dL (3 mmol/L), you have very low blood sugar (severe hypoglycemia). This is an emergency. Do not wait to see if the symptoms will go away. Get medical help right away. Call your local emergency services (911 in the [place] Do not drive yourself to the hospital. If you have very low blood sugar and you cannot eat or drink, you may need a glucagon shot (injection). A family member or friend should learn how to check your blood sugar and how to give you a glucagon shot. Ask your doctor if you need to have a glucagon shot kit at home. What else is important to manage my diabetes? Medicine  Follow these instructions about insulin and diabetes medicines:  Take them as told by your doctor.  Adjust them as told by your doctor.  Do not run out of them.  Having diabetes can raise your risk for other long-term conditions. These include heart or kidney disease. Your doctor may prescribe medicines to help prevent problems from diabetes. Food   Make healthy food choices. These include: ? Chicken, fish, egg whites, and beans. ? Oats, whole wheat, bulgur, Looney rice, quinoa, and millet. ? Fresh fruits and vegetables. ? Low-fat dairy products. ? Nuts,  avocado, olive oil, and canola oil.  Make a food plan with a specialist (dietitian).  Follow instructions from your doctor about what you cannot eat or drink.  Drink enough fluid to keep your pee (urine) clear or pale yellow.  Eat healthy snacks between healthy meals.  Keep track of carbs that you eat. Read food labels. Learn food serving sizes.  Follow your sick day plan when you cannot eat or drink normally. Make this plan with your doctor so it is ready to use. Activity  Exercise at least 3 times a week.  Do not go more than 2 days without exercising.  Talk with your doctor before you start a new exercise. Your doctor may need to adjust your insulin, medicines, or food. Lifestyle   Do not use any tobacco products. These include cigarettes, chewing tobacco, and e-cigarettes.If you need help quitting, ask your doctor.  Ask your doctor how much alcohol is safe for you.  Learn to deal with stress. If you need help with this, ask your doctor. Body care  Stay up to date with your shots (immunizations).  Have your eyes and feet checked by a doctor as often as told.  Check your skin and feet every day. Check for cuts, bruises, redness, blisters, or sores.  Brush your teeth and gums two times a day.  Floss at least one time a day.  Go to the dentist least one time every 6 months.  Stay at a healthy weight. General instructions   Take over-the-counter and prescription medicines only as told by your doctor.  Share your diabetes care plan with: ? Your work or school. ? People you live with.  Check your pee (urine) for ketones: ? When you are sick. ? As told by your doctor.  Carry a card or wear jewelry that says that you have diabetes.  Ask your doctor: ? Do I need to meet with a diabetes educator? ? Where can I find a support group for people with diabetes?  Keep all follow-up visits as told by your doctor. This is important. Where to find more information: To  learn more about diabetes, visit:  American Diabetes Association: www.diabetes.org  American Association of Diabetes Educators: www.diabeteseducator.org/patient-resources  This information is not intended to replace advice given to you by your health care provider. Make sure you discuss any questions you have with your health care provider. Document Released: 10/04/2015 Document Revised: 11/18/2015 Document Reviewed: 07/16/2015 Elsevier Interactive Patient Education  [employer].

## 2017-05-16 ENCOUNTER — Ambulatory Visit: Payer: Medicare Other | Admitting: Family Medicine

## 2017-05-16 ENCOUNTER — Encounter: Payer: Self-pay | Admitting: Family Medicine

## 2017-05-16 ENCOUNTER — Ambulatory Visit: Payer: Self-pay

## 2017-05-16 VITALS — BP 110/80 | HR 93 | Temp 98.5°F | Wt 237.5 lb

## 2017-05-16 DIAGNOSIS — K5900 Constipation, unspecified: Secondary | ICD-10-CM | POA: Diagnosis not present

## 2017-05-16 DIAGNOSIS — K582 Mixed irritable bowel syndrome: Secondary | ICD-10-CM

## 2017-05-16 NOTE — Patient Instructions (Signed)
Constipation, Adult Constipation is when a person has fewer bowel movements in a week than normal, has difficulty having a bowel movement, or has stools that are dry, hard, or larger than normal. Constipation may be caused by an underlying condition. It may become worse with age if a person takes certain medicines and does not take in enough fluids. Follow these instructions at home: Eating and drinking   Eat foods that have a lot of fiber, such as fresh fruits and vegetables, whole grains, and beans.  Limit foods that are high in fat, low in fiber, or overly processed, such as french fries, hamburgers, cookies, candies, and soda.  Drink enough fluid to keep your urine clear or pale yellow. General instructions  Exercise regularly or as told by your health care provider.  Go to the restroom when you have the urge to go. Do not hold it in.  Take over-the-counter and prescription medicines only as told by your health care provider. These include any fiber supplements.  Practice pelvic floor retraining exercises, such as deep breathing while relaxing the lower abdomen and pelvic floor relaxation during bowel movements.  Watch your condition for any changes.  Keep all follow-up visits as told by your health care provider. This is important. Contact a health care provider if:  You have pain that gets worse.  You have a fever.  You do not have a bowel movement after 4 days.  You vomit.  You are not hungry.  You lose weight.  You are bleeding from the anus.  You have thin, pencil-like stools. Get help right away if:  You have a fever and your symptoms suddenly get worse.  You leak stool or have blood in your stool.  Your abdomen is bloated.  You have severe pain in your abdomen.  You feel dizzy or you faint. This information is not intended to replace advice given to you by your health care provider. Make sure you discuss any questions you have with your health care  provider. Document Released: 03/10/2004 Document Revised: 12/31/2015 Document Reviewed: 12/01/2015 Elsevier Interactive Patient Education  2017 [employer]  I would hold the [place] for now and discuss with your endocrinologist Drink plenty of fluids Follow up for any fever or increasing abdominal pain.
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
## 2017-05-16 NOTE — Telephone Encounter (Signed)
Pt c/o "distended and bloated" abdomen. Pt states it has been present since 04/25/17. Pt is c/o being constipated lately and having to take Miralax to have a BM. Pt given care advice and instructed to keep a pain diary. Made a Appt for Monday 05/21/17 @ 1400 with Dr. Salomon FickBanks.  Reason for Disposition . Abdominal pain is a chronic symptom (recurrent or ongoing AND present > 4 weeks)  Answer Assessment - Initial Assessment Questions 1. LOCATION: "Where does it hurt?"      Hurts high above belly button 2. RADIATION: "Does the pain shoot anywhere else?" (e.g., chest, back)     no 3. ONSET: "When did the pain begin?" (e.g., minutes, hours or days ago)      Oct 31  4. SUDDEN: "Gradual or sudden onset?"     Gradually and has gradually worsened 5. PATTERN "Does the pain come and go, or is it constant?"    - If constant: "Is it getting better, staying the same, or worsening?"      (Note: Constant means the pain never goes away completely; most serious pain is constant and it progresses)     - If intermittent: "How long does it last?" "Do you have pain now?"     (Note: Intermittent means the pain goes away completely between bouts)     "Only hurts when you press on it, but is constantly uncomfortable like a fullness and prressure 6. SEVERITY: "How bad is the pain?"  (e.g., Scale 1-10; mild, moderate, or severe)   - MILD (1-3): doesn't interfere with normal activities, abdomen soft and not tender to touch    - MODERATE (4-7): interferes with normal activities or awakens from sleep, tender to touch    - SEVERE (8-10): excruciating pain, doubled over, unable to do any normal activities      When she presses on it 6/10 with the fullness pain is "right under moderate pain" 7. RECURRENT SYMPTOM: "Have you ever had this type of abdominal pain before?" If so, ask: "When was the last time?" and "What happened that time?"      No doesn't recall any previous complaint 8. CAUSE: "What do you think is causing the  abdominal pain?"     "I don't know. I have chronic pain everywhere d/t fibromyalgia 9. RELIEVING/AGGRAVATING FACTORS: "What makes it better or worse?" (e.g., movement, antacids, bowel movement)     Better: "I dont know I know when I'm stressed it makes it worse." Pt states she has a h/o IBS and alternated between diarrhea and constipation. Right now feels like constipation" Last BM yesterday and this am.  10. OTHER SYMPTOMS: "Has there been any vomiting, diarrhea, constipation, or urine problems?"       Constipation Pt has interstitial cystitis  11. PREGNANCY: "Is there any chance you are pregnant?" "When was your last menstrual period?"       No S/P hysterectomy  Protocols used: [facility] - [facility]

## 2017-05-16 NOTE — Telephone Encounter (Signed)
Spoke with pt and she would like to be seen today. Appt schedule with Dr. Caryl NeverBurchette for this afternoon.

## 2017-05-21 ENCOUNTER — Ambulatory Visit: Payer: Medicare Other | Admitting: Family Medicine

## 2017-05-22 ENCOUNTER — Other Ambulatory Visit: Payer: Self-pay

## 2017-05-22 MED ORDER — METFORMIN HCL ER 500 MG PO TB24: 1000.0000 mg | ORAL_TABLET | Freq: Every day | ORAL | 3 refills | Status: DC

## 2017-05-24 ENCOUNTER — Ambulatory Visit: Payer: Medicare Other | Admitting: Endocrinology

## 2017-06-08 ENCOUNTER — Ambulatory Visit: Payer: Medicare Other | Admitting: Endocrinology

## 2017-06-08 ENCOUNTER — Encounter: Payer: Self-pay | Admitting: Endocrinology

## 2017-06-08 VITALS — BP 93/64 | HR 71 | Wt 234.6 lb

## 2017-06-08 DIAGNOSIS — E1140 Type 2 diabetes mellitus with diabetic neuropathy, unspecified: Secondary | ICD-10-CM

## 2017-06-08 LAB — POCT GLYCOSYLATED HEMOGLOBIN (HGB A1C): Hemoglobin A1C: 6.8

## 2017-06-08 MED ORDER — SAXAGLIPTIN HCL 5 MG PO TABS: 5.0000 mg | ORAL_TABLET | Freq: Every day | ORAL | 3 refills | Status: DC

## 2017-06-08 NOTE — Patient Instructions (Addendum)
check your blood sugar once a day.  vary the time of day when you check, between before the 3 meals, and at bedtime.  also check if you have symptoms of your blood sugar being too high or too low.  please keep a record of the readings and bring it to your next appointment here (or you can bring the meter itself).  You can write it on any piece of paper.  please call us sooner if your blood sugar goes below 70, or if you have a lot of readings over 200.  Here is a new meter. I have sent a prescription to your pharmacy, to double the onglyza.   Try putting hydrocortisone and miconazole cream on your feet.  Please come back for a follow-up appointment in 3 months
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
## 2017-06-28 ENCOUNTER — Encounter: Payer: Self-pay | Admitting: Dietician

## 2017-06-28 ENCOUNTER — Encounter: Payer: Medicare Other | Attending: Endocrinology | Admitting: Dietician

## 2017-06-28 DIAGNOSIS — Z713 Dietary counseling and surveillance: Secondary | ICD-10-CM | POA: Insufficient documentation

## 2017-06-28 DIAGNOSIS — E1140 Type 2 diabetes mellitus with diabetic neuropathy, unspecified: Secondary | ICD-10-CM | POA: Diagnosis present

## 2017-06-28 NOTE — Patient Instructions (Signed)
Vitamin D3- 2000 units daily Increase your water intake. Continue increase vegetable intake. Choose low fat. Avoid skipping meals (this may help craving at night) Find some form of activity (walking) Small amounts of protein with each meal and snack.  Consider calling your insurance about FreeStyle [place] and look at your current cost of strips.  Libre cost without insurance $75/month  Resource pcrm.org for further plant based information.

## 2017-06-29 NOTE — Progress Notes (Signed)
Diabetes Self-Management Education  Visit Type: First/Initial  Appt. Start Time: 1535 Appt. End Time: 1710  06/29/2017  [name], identified by name and date of birth, is a 47 y.o. female with a diagnosis of Diabetes: Type 2. Diabetes was diagnosed in 1997 after her son was born.  Other history includes GERD, IBS, Hyperlipidemia, PTSD, depression, fibromyalgia, and PTSD.  Pain interfers with her ability to exercise as well as sleep.  She is unable to sleep more than 4-5 hours per night.  She quit smoking 1 year ago and since then eats more.    Patient lives with her mother, father, husband, and 2 sons.  She is on disability.  Patient and mother share cooking.  Her mother does organic gardening.  Medications include metformin , onglyze, CBD oil.  ASSESSMENT  Height 5\' 6"  (1.676 m), weight 235 lb (106.6 kg), last menstrual period 11/12/2010. Body mass index is 37.93 kg/m.  Diabetes Self-Management Education - 06/28/17 1552      Visit Information   Visit Type  First/Initial      Initial Visit   Diabetes Type  Type 2    Are you currently following a meal plan?  No    Are you taking your medications as prescribed?  Yes    Date Diagnosed  63 when son was [age]      Health Coping   How would you rate your overall health?  Good      Psychosocial Assessment   Patient Belief/Attitude about Diabetes  Motivated to manage diabetes    Self-care barriers  Other (comment) chronic pain    Self-management support  Doctor's office;Family    Other persons present  Patient    Patient Concerns  Nutrition/Meal planning;Glycemic Control    Special Needs  None    Preferred Learning Style  No preference indicated    Learning Readiness  Ready    How often do you need to have someone help you when you read instructions, pamphlets, or other written materials from your doctor or pharmacy?  1 - Never    What is the last grade level you completed in school?  diploma      Pre-Education Assessment    Patient understands the diabetes disease and treatment process.  Needs Review    Patient understands incorporating nutritional management into lifestyle.  Needs Review    Patient undertands incorporating physical activity into lifestyle.  Needs Review    Patient understands using medications safely.  Needs Review    Patient understands monitoring blood glucose, interpreting and using results  Needs Review    Patient understands prevention, detection, and treatment of acute complications.  Needs Review    Patient understands prevention, detection, and treatment of chronic complications.  Needs Review    Patient understands how to develop strategies to address psychosocial issues.  Needs Review    Patient understands how to develop strategies to promote health/change behavior.  Needs Review      Complications   Last HgB A1C per patient/outside source  6.8 % 06/08/17    How often do you check your blood sugar?  3-4 times/day    Fasting Blood glucose range (mg/dL)  161-096;04-540    Postprandial Blood glucose range (mg/dL)  >[phone]    Number of hypoglycemic episodes per month  1    Can you tell when your blood sugar is low?  Yes    What do you do if your blood sugar is low?  candy or OJ  Number of hyperglycemic episodes per week  14    Can you tell when your blood sugar is high?  Yes    What do you do if your blood sugar is high?  drinks water, walks    Have you had a dilated eye exam in the past 12 months?  Yes    Have you had a dental exam in the past 12 months?  No    Are you checking your feet?  No      Dietary Intake   Breakfast  raisin date walnut instant oatmeal, 1/2 banana 8    Lunch  skips OR greek yogurt (flips) OR protein bar OR half banana OR 1/2 apple and PB or almond butter OR nuts inconsistent time    Snack (afternoon)  string cheese    Dinner  vegetables, black eyed peas, bacon OR 6" sub OR or vegetables, fish OR meat, vegetables, sweet potato or Lean Cuisine 5-6     Snack (evening)  chips, sweets "junk food"    Beverage(s)  coke zero, smart water, occasional half and haft tea      Exercise   Exercise Type  ADL's    How many days per week to you exercise?  0    How many minutes per day do you exercise?  0    Total minutes per week of exercise  0      Patient Education   Previous Diabetes Education  Yes (please comment) past    Disease state   Definition of diabetes, type 1 and 2, and the diagnosis of diabetes    Nutrition management   Role of diet in the treatment of diabetes and the relationship between the three main macronutrients and blood glucose level;Information on hints to eating out and maintain blood glucose control.;Meal options for control of blood glucose level and chronic complications.;Food label reading, portion sizes and measuring food.    Physical activity and exercise   Role of exercise on diabetes management, blood pressure control and cardiac health.;Helped patient identify appropriate exercises in relation to his/her diabetes, diabetes complications and other health issue.    Medications  Reviewed patients medication for diabetes, action, purpose, timing of dose and side effects.    Monitoring  Purpose and frequency of SMBG.;Identified appropriate SMBG and/or A1C goals.;Daily foot exams;Yearly dilated eye exam    Acute complications  Taught treatment of hypoglycemia - the 15 rule.    Chronic complications  Relationship between chronic complications and blood glucose control    Psychosocial adjustment  Worked with patient to identify barriers to care and solutions      Individualized Goals (developed by patient)   Nutrition  General guidelines for healthy choices and portions discussed    Physical Activity  Exercise 3-5 times per week;15 minutes per day    Medications  take my medication as prescribed    Monitoring   test my blood glucose as discussed    Reducing Risk  examine blood glucose patterns    Health Coping  discuss  diabetes with (comment) MD/RD/CDE      Post-Education Assessment   Patient understands the diabetes disease and treatment process.  Demonstrates understanding / competency    Patient understands incorporating nutritional management into lifestyle.  Needs Review    Patient undertands incorporating physical activity into lifestyle.  Demonstrates understanding / competency    Patient understands using medications safely.  Demonstrates understanding / competency    Patient understands monitoring blood glucose, interpreting and using results  Demonstrates understanding / competency    Patient understands prevention, detection, and treatment of acute complications.  Demonstrates understanding / competency    Patient understands prevention, detection, and treatment of chronic complications.  Demonstrates understanding / competency    Patient understands how to develop strategies to address psychosocial issues.  Demonstrates understanding / competency    Patient understands how to develop strategies to promote health/change behavior.  Needs Review      Outcomes   Expected Outcomes  Demonstrated interest in learning. Expect positive outcomes    Future DMSE  4-6 wks    Program Status  Completed       Individualized Plan for Diabetes Self-Management Training:   Learning Objective:  Patient will have a greater understanding of diabetes self-management. Patient education plan is to attend individual and/or group sessions per assessed needs and concerns.   Plan:   Patient Instructions  Vitamin D3- 2000 units daily Increase your water intake. Continue increase vegetable intake. Choose low fat. Avoid skipping meals (this may help craving at night) Find some form of activity (walking) Small amounts of protein with each meal and snack.  Consider calling your insurance about FreeStyle [place] and look at your current cost of strips.  Libre cost without insurance $75/month  Resource pcrm.org for  further plant based information.   Expected Outcomes:  Demonstrated interest in learning. Expect positive outcomes  Education material provided: Living Well with Diabetes, Food label handouts, A1C conversion sheet, Meal plan card, My Plate and Snack sheet; cholesterol and triglycerides.  If problems or questions, -patient to contact team via:  Phone  Future DSME appointment: 4-6 wks- She would like to learn more about meal planning at her next visit./

## 2017-07-23 ENCOUNTER — Ambulatory Visit: Payer: Self-pay | Admitting: *Deleted

## 2017-07-23 NOTE — Telephone Encounter (Signed)
Pt states she has been experiencing excruciating pain in her right foot for about 2 months making it difficult to walk. Pt describes the pain as like "a knife stabbing" on the heel of her foot and on the bottom and sides of her foot she states she has a throbbing pain.Pt is rating her pain 7 on a scale of 1-10. Pt states she has a history of plantar fasciitis and fibromyalgia. Explained to pt that being a diabetic she may experience pain due to diabetic neuropathy but she would need an appt for further evaluation of current cause of pain. Pt advised of home care to help with pain until seen in the officePt asking to be seen in the office tomorrow due to pain. Pt scheduled for appt with Dr. Salomon FickBanks.  Reason for Disposition . [1] MODERATE pain (e.g., interferes with normal activities, limping) AND [2] present > 3 days  Answer Assessment - Initial Assessment Questions 1. ONSET: "When did the pain start?"      2 months ago 2. LOCATION: "Where is the pain located?"      Right foot-bottom of foot, heel and on sides of the foot 3. PAIN: "How bad is the pain?"    (Scale 1-10; or mild, moderate, severe)   -  MILD (1-3): doesn't interfere with normal activities    -  MODERATE (4-7): interferes with normal activities (e.g., work or school) or awakens from sleep, limping    -  SEVERE (8-10): excruciating pain, unable to do any normal activities, unable to walk     Moderate 7, feels like a stabbing pain on the heel of foot and throbbing pain to other areas of the foot 4. WORK OR EXERCISE: "Has there been any recent work or exercise that involved this part of the body?"      No 5. CAUSE: "What do you think is causing the foot pain?"     Unsure pt has a history of plantar fasciitis 6. OTHER SYMPTOMS: "Do you have any other symptoms?" (e.g., leg pain, rash, fever, numbness)     No 7. PREGNANCY: "Is there any chance you are pregnant?" "When was your last menstrual period?"    Not assessed  Protocols used: FOOT  PAIN-A-AH

## 2017-07-24 ENCOUNTER — Ambulatory Visit: Payer: Medicare Other | Admitting: Family Medicine

## 2017-07-24 ENCOUNTER — Encounter: Payer: Self-pay | Admitting: Family Medicine

## 2017-07-24 VITALS — BP 110/70 | HR 75 | Temp 98.6°F

## 2017-07-24 DIAGNOSIS — M722 Plantar fascial fibromatosis: Secondary | ICD-10-CM

## 2017-07-24 DIAGNOSIS — E1140 Type 2 diabetes mellitus with diabetic neuropathy, unspecified: Secondary | ICD-10-CM

## 2017-07-24 NOTE — Patient Instructions (Addendum)
Plantar Fasciitis Plantar fasciitis is a painful foot condition that affects the heel. It occurs when the band of tissue that connects the toes to the heel bone (plantar fascia) becomes irritated. This can happen after exercising too much or doing other repetitive activities (overuse injury). The pain from plantar fasciitis can range from mild irritation to severe pain that makes it difficult for you to walk or move. The pain is usually worse in the morning or after you have been sitting or lying down for a while. What are the causes? This condition may be caused by:  Standing for long periods of time.  Wearing shoes that do not fit.  Doing high-impact activities, including running, aerobics, and ballet.  Being overweight.  Having an abnormal way of walking (gait).  Having tight calf muscles.  Having high arches in your feet.  Starting a new athletic activity.  What are the signs or symptoms? The main symptom of this condition is heel pain. Other symptoms include:  Pain that gets worse after activity or exercise.  Pain that is worse in the morning or after resting.  Pain that goes away after you walk for a few minutes.  How is this diagnosed? This condition may be diagnosed based on your signs and symptoms. Your health care provider will also do a physical exam to check for:  A tender area on the bottom of your foot.  A high arch in your foot.  Pain when you move your foot.  Difficulty moving your foot.  You may also need to have imaging studies to confirm the diagnosis. These can include:  X-rays.  Ultrasound.  MRI.  How is this treated? Treatment for plantar fasciitis depends on the severity of the condition. Your treatment may include:  Rest, ice, and over-the-counter pain medicines to manage your pain.  Exercises to stretch your calves and your plantar fascia.  A splint that holds your foot in a stretched, upward position while you sleep (night  splint).  Physical therapy to relieve symptoms and prevent problems in the future.  Cortisone injections to relieve severe pain.  Extracorporeal shock wave therapy (ESWT) to stimulate damaged plantar fascia with electrical impulses. It is often used as a last resort before surgery.  Surgery, if other treatments have not worked after 12 months.  Follow these instructions at home:  Take medicines only as directed by your health care provider.  Avoid activities that cause pain.  Roll the bottom of your foot over a bag of ice or a bottle of cold water. Do this for 20 minutes, 3-4 times a day.  Perform simple stretches as directed by your health care provider.  Try wearing athletic shoes with air-sole or gel-sole cushions or soft shoe inserts.  Wear a night splint while sleeping, if directed by your health care provider.  Keep all follow-up appointments with your health care provider. How is this prevented?  Do not perform exercises or activities that cause heel pain.  Consider finding low-impact activities if you continue to have problems.  Lose weight if you need to. The best way to prevent plantar fasciitis is to avoid the activities that aggravate your plantar fascia. Contact a health care provider if:  Your symptoms do not go away after treatment with home care measures.  Your pain gets worse.  Your pain affects your ability to move or do your daily activities. This information is not intended to replace advice given to you by your health care provider. Make sure you   discuss any questions you have with your health care provider. Document Released: 03/07/2001 Document Revised: 11/15/2015 Document Reviewed: 04/22/2014 Elsevier Interactive Patient Education  2018 [employer]   Plantar Fasciitis Rehab Ask your health care provider which exercises are safe for you. Do exercises exactly as told by your health care provider and adjust them as directed. It is normal to feel  mild stretching, pulling, tightness, or discomfort as you do these exercises, but you should stop right away if you feel sudden pain or your pain gets worse. Do not begin these exercises until told by your health care provider. Stretching and range of motion exercises These exercises warm up your muscles and joints and improve the movement and flexibility of your foot. These exercises also help to relieve pain. Exercise A: Plantar fascia stretch  1. Sit with your left / right leg crossed over your opposite knee. 2. Hold your heel with one hand with that thumb near your arch. With your other hand, hold your toes and gently pull them back toward the top of your foot. You should feel a stretch on the bottom of your toes or your foot or both. 3. Hold this stretch for__________ seconds. 4. Slowly release your toes and return to the starting position. Repeat __________ times. Complete this exercise __________ times a day. Exercise B: Gastroc, standing  1. Stand with your hands against a wall. 2. Extend your left / right leg behind you, and bend your front knee slightly. 3. Keeping your heels on the floor and keeping your back knee straight, shift your weight toward the wall without arching your back. You should feel a gentle stretch in your left / right calf. 4. Hold this position for __________ seconds. Repeat __________ times. Complete this exercise __________ times a day. Exercise C: Soleus, standing 1. Stand with your hands against a wall. 2. Extend your left / right leg behind you, and bend your front knee slightly. 3. Keeping your heels on the floor, bend your back knee and slightly shift your weight over the back leg. You should feel a gentle stretch deep in your calf. 4. Hold this position for __________ seconds. Repeat __________ times. Complete this exercise __________ times a day. Exercise D: Gastrocsoleus, standing 1. Stand with the ball of your left / right foot on a step. The ball of  your foot is on the walking surface, right under your toes. 2. Keep your other foot firmly on the same step. 3. Hold onto the wall or a railing for balance. 4. Slowly lift your other foot, allowing your body weight to press your heel down over the edge of the step. You should feel a stretch in your left / right calf. 5. Hold this position for __________ seconds. 6. Return both feet to the step. 7. Repeat this exercise with a slight bend in your left / right knee. Repeat __________ times with your left / right knee straight and __________ times with your left / right knee bent. Complete this exercise __________ times a day. Balance exercise This exercise builds your balance and strength control of your arch to help take pressure off your plantar fascia. Exercise E: Single leg stand 1. Without shoes, stand near a railing or in a doorway. You may hold onto the railing or door frame as needed. 2. Stand on your left / right foot. Keep your big toe down on the floor and try to keep your arch lifted. Do not let your foot roll inward. 3. Hold   this position for __________ seconds. 4. If this exercise is too easy, you can try it with your eyes closed or while standing on a pillow. Repeat __________ times. Complete this exercise __________ times a day. This information is not intended to replace advice given to you by your health care provider. Make sure you discuss any questions you have with your health care provider. Document Released: 06/12/2005 Document Revised: 02/15/2016 Document Reviewed: 04/26/2015 Elsevier Interactive Patient Education  2018 [employer]   
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
## 2017-07-26 ENCOUNTER — Ambulatory Visit: Payer: Medicare Other | Admitting: Family Medicine

## 2017-07-30 ENCOUNTER — Encounter: Payer: Self-pay | Admitting: Endocrinology

## 2017-07-30 NOTE — Telephone Encounter (Signed)
Patient is calling on the status of medication onglyza?  She think it is causing her to have stomach cramps.  Pls advise

## 2017-08-01 MED ORDER — SAXAGLIPTIN HCL 5 MG PO TABS: 2.5000 mg | ORAL_TABLET | Freq: Every day | ORAL | 3 refills | Status: AC

## 2017-08-02 ENCOUNTER — Ambulatory Visit: Payer: Medicare Other | Admitting: Dietician

## 2017-09-06 ENCOUNTER — Ambulatory Visit: Payer: Medicare Other | Admitting: Endocrinology

## 2017-09-06 DIAGNOSIS — Z0289 Encounter for other administrative examinations: Secondary | ICD-10-CM

## 2017-09-19 ENCOUNTER — Telehealth: Payer: Self-pay

## 2017-09-19 ENCOUNTER — Other Ambulatory Visit: Payer: Self-pay

## 2017-09-19 NOTE — Telephone Encounter (Signed)
[facility] requesting a PA for Onglyza- I called the patient and left her a VM requesting she call her insurance to find out what is covered and call us back

## 2017-09-24 ENCOUNTER — Ambulatory Visit: Payer: Self-pay | Admitting: Family Medicine

## 2017-10-04 ENCOUNTER — Encounter: Payer: Self-pay | Admitting: Endocrinology
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
## 2018-02-12 ENCOUNTER — Ambulatory Visit: Payer: Medicare Other

## 2018-05-06 DIAGNOSIS — F1190 Opioid use, unspecified, uncomplicated: Secondary | ICD-10-CM | POA: Insufficient documentation

## 2018-06-05 ENCOUNTER — Other Ambulatory Visit: Payer: Self-pay | Admitting: Obstetrics & Gynecology

## 2018-06-12 ENCOUNTER — Other Ambulatory Visit: Payer: Self-pay | Admitting: Endocrinology

## 2018-06-27 ENCOUNTER — Other Ambulatory Visit: Payer: Self-pay | Admitting: Endocrinology

## 2018-07-27 ENCOUNTER — Other Ambulatory Visit: Payer: Self-pay | Admitting: Endocrinology

## 2018-07-27 NOTE — Telephone Encounter (Signed)
Please refill x 1 Ov is due  

## 2018-08-20 ENCOUNTER — Other Ambulatory Visit: Payer: Self-pay | Admitting: Endocrinology

## 2018-08-30 DIAGNOSIS — N3010 Interstitial cystitis (chronic) without hematuria: Secondary | ICD-10-CM | POA: Insufficient documentation

## 2018-10-28 ENCOUNTER — Ambulatory Visit: Payer: Medicare Other | Attending: Psychiatry | Admitting: Physical Therapy

## 2018-11-25 ENCOUNTER — Encounter: Payer: Medicare Other | Admitting: Family Medicine

## 2018-12-04 ENCOUNTER — Ambulatory Visit (INDEPENDENT_AMBULATORY_CARE_PROVIDER_SITE_OTHER): Payer: Medicare Other | Admitting: Family Medicine

## 2018-12-04 ENCOUNTER — Encounter: Payer: Self-pay | Admitting: Family Medicine

## 2018-12-04 ENCOUNTER — Other Ambulatory Visit: Payer: Self-pay

## 2018-12-04 VITALS — BP 112/64 | HR 68 | Temp 98.2°F | Wt 234.0 lb

## 2018-12-04 DIAGNOSIS — Z0000 Encounter for general adult medical examination without abnormal findings: Secondary | ICD-10-CM

## 2018-12-04 DIAGNOSIS — R232 Flushing: Secondary | ICD-10-CM

## 2018-12-04 DIAGNOSIS — E782 Mixed hyperlipidemia: Secondary | ICD-10-CM

## 2018-12-04 DIAGNOSIS — K5903 Drug induced constipation: Secondary | ICD-10-CM | POA: Diagnosis not present

## 2018-12-04 DIAGNOSIS — Z0001 Encounter for general adult medical examination with abnormal findings: Secondary | ICD-10-CM | POA: Diagnosis not present

## 2018-12-04 DIAGNOSIS — R102 Pelvic and perineal pain: Secondary | ICD-10-CM

## 2018-12-04 DIAGNOSIS — E1140 Type 2 diabetes mellitus with diabetic neuropathy, unspecified: Secondary | ICD-10-CM

## 2018-12-04 DIAGNOSIS — T402X5A Adverse effect of other opioids, initial encounter: Secondary | ICD-10-CM

## 2018-12-04 LAB — CBC WITH DIFFERENTIAL/PLATELET
Basophils Absolute: 0.1 10*3/uL (ref 0.0–0.1)
Basophils Relative: 0.9 % (ref 0.0–3.0)
Eosinophils Absolute: 0.4 10*3/uL (ref 0.0–0.7)
Eosinophils Relative: 5.7 % — ABNORMAL HIGH (ref 0.0–5.0)
HCT: 41.9 % (ref 36.0–46.0)
Hemoglobin: 13.6 g/dL (ref 12.0–15.0)
Lymphocytes Relative: 28.6 % (ref 12.0–46.0)
Lymphs Abs: 2.3 10*3/uL (ref 0.7–4.0)
MCHC: 32.5 g/dL (ref 30.0–36.0)
MCV: 93 fl (ref 78.0–100.0)
Monocytes Absolute: 0.6 10*3/uL (ref 0.1–1.0)
Monocytes Relative: 8.1 % (ref 3.0–12.0)
Neutro Abs: 4.5 10*3/uL (ref 1.4–7.7)
Neutrophils Relative %: 56.7 % (ref 43.0–77.0)
Platelets: 235 10*3/uL (ref 150.0–400.0)
RBC: 4.5 Mil/uL (ref 3.87–5.11)
RDW: 13.2 % (ref 11.5–15.5)
WBC: 7.9 10*3/uL (ref 4.0–10.5)

## 2018-12-04 LAB — LIPID PANEL
Cholesterol: 134 mg/dL (ref 0–200)
HDL: 54.3 mg/dL (ref >39.00)
LDL Cholesterol: 53 mg/dL (ref 0–99)
NonHDL: 79.34
Total CHOL/HDL Ratio: 2
Triglycerides: 133 mg/dL (ref 0.0–149.0)
VLDL: 26.6 mg/dL (ref 0.0–40.0)

## 2018-12-04 LAB — BASIC METABOLIC PANEL
BUN: 14 mg/dL (ref 6–23)
CO2: 32 mEq/L (ref 19–32)
Calcium: 9.3 mg/dL (ref 8.4–10.5)
Chloride: 98 mEq/L (ref 96–112)
Creatinine, Ser: 0.61 mg/dL (ref 0.40–1.20)
GFR: 104.31 mL/min (ref >60.00)
Glucose, Bld: 105 mg/dL — ABNORMAL HIGH (ref 70–99)
Potassium: 4.6 mEq/L (ref 3.5–5.1)
Sodium: 138 mEq/L (ref 135–145)

## 2018-12-04 LAB — T4, FREE: Free T4: 0.75 ng/dL (ref 0.60–1.60)

## 2018-12-04 LAB — MICROALBUMIN / CREATININE URINE RATIO
Creatinine,U: 119.5 mg/dL
Microalb Creat Ratio: 0.6 mg/g (ref 0.0–30.0)
Microalb, Ur: <0.7 mg/dL (ref 0.0–1.9)

## 2018-12-04 LAB — TSH: TSH: 1.28 u[IU]/mL (ref 0.35–4.50)

## 2018-12-04 LAB — HEMOGLOBIN A1C: Hgb A1c MFr Bld: 6.7 % — ABNORMAL HIGH (ref 4.6–6.5)

## 2018-12-04 NOTE — Patient Instructions (Signed)
Preventive Care 40-64 Years, Female Preventive care refers to lifestyle choices and visits with your health care provider that can promote health and wellness. What does preventive care include?   A yearly physical exam. This is also called an annual well check.  Dental exams once or twice a year.  Routine eye exams. Ask your health care provider how often you should have your eyes checked.  Personal lifestyle choices, including: ? Daily care of your teeth and gums. ? Regular physical activity. ? Eating a healthy diet. ? Avoiding tobacco and drug use. ? Limiting alcohol use. ? Practicing safe sex. ? Taking low-dose aspirin daily starting at age 39. ? Taking vitamin and mineral supplements as recommended by your health care provider. What happens during an annual well check? The services and screenings done by your health care provider during your annual well check will depend on your age, overall health, lifestyle risk factors, and family history of disease. Counseling Your health care provider may ask you questions about your:  Alcohol use.  Tobacco use.  Drug use.  Emotional well-being.  Home and relationship well-being.  Sexual activity.  Eating habits.  Work and work Statistician.  Method of birth control.  Menstrual cycle.  Pregnancy history. Screening You may have the following tests or measurements:  Height, weight, and BMI.  Blood pressure.  Lipid and cholesterol levels. These may be checked every 5 years, or more frequently if you are over 51 years old.  Skin check.  Lung cancer screening. You may have this screening every year starting at age 60 if you have a 30-pack-year history of smoking and currently smoke or have quit within the past 15 years.  Colorectal cancer screening. All adults should have this screening starting at age 55 and continuing until age 13. Your health care provider may recommend screening at age 67. You will have tests every  1-10 years, depending on your results and the type of screening test. People at increased risk should start screening at an earlier age. Screening tests may include: ? Guaiac-based fecal occult blood testing. ? Fecal immunochemical test (FIT). ? Stool DNA test. ? Virtual colonoscopy. ? Sigmoidoscopy. During this test, a flexible tube with a tiny camera (sigmoidoscope) is used to examine your rectum and lower colon. The sigmoidoscope is inserted through your anus into your rectum and lower colon. ? Colonoscopy. During this test, a long, thin, flexible tube with a tiny camera (colonoscope) is used to examine your entire colon and rectum.  Hepatitis C blood test.  Hepatitis B blood test.  Sexually transmitted disease (STD) testing.  Diabetes screening. This is done by checking your blood sugar (glucose) after you have not eaten for a while (fasting). You may have this done every 1-3 years.  Mammogram. This may be done every 1-2 years. Talk to your health care provider about when you should start having regular mammograms. This may depend on whether you have a family history of breast cancer.  BRCA-related cancer screening. This may be done if you have a family history of breast, ovarian, tubal, or peritoneal cancers.  Pelvic exam and Pap test. This may be done every 3 years starting at age 12. Starting at age 25, this may be done every 5 years if you have a Pap test in combination with an HPV test.  Bone density scan. This is done to screen for osteoporosis. You may have this scan if you are at high risk for osteoporosis. Discuss your test results, treatment options,  and if necessary, the need for more tests with your health care provider. Vaccines Your health care provider may recommend certain vaccines, such as:  Influenza vaccine. This is recommended every year.  Tetanus, diphtheria, and acellular pertussis (Tdap, Td) vaccine. You may need a Td booster every 10 years.  Varicella  vaccine. You may need this if you have not been vaccinated.  Zoster vaccine. You may need this after age 37.  Measles, mumps, and rubella (MMR) vaccine. You may need at least one dose of MMR if you were born in 1957 or later. You may also need a second dose.  Pneumococcal 13-valent conjugate (PCV13) vaccine. You may need this if you have certain conditions and were not previously vaccinated.  Pneumococcal polysaccharide (PPSV23) vaccine. You may need one or two doses if you smoke cigarettes or if you have certain conditions.  Meningococcal vaccine. You may need this if you have certain conditions.  Hepatitis A vaccine. You may need this if you have certain conditions or if you travel or work in places where you may be exposed to hepatitis A.  Hepatitis B vaccine. You may need this if you have certain conditions or if you travel or work in places where you may be exposed to hepatitis B.  Haemophilus influenzae type b (Hib) vaccine. You may need this if you have certain conditions. Talk to your health care provider about which screenings and vaccines you need and how often you need them. This information is not intended to replace advice given to you by your health care provider. Make sure you discuss any questions you have with your health care provider. Document Released: 07/09/2015 Document Revised: 08/02/2017 Document Reviewed: 04/13/2015 Elsevier Interactive Patient Education  2019 [employer]  Preventing Diabetes Mellitus Complications You can take action to prevent or slow down problems that are caused by diabetes (diabetes mellitus). Following your diabetes plan and taking care of yourself can reduce your risk of serious or life-threatening complications. What actions can I take to prevent diabetes complications? Manage your diabetes   Follow instructions from your health care providers about managing your diabetes. Your diabetes may be managed by a team of health care providers  who can teach you how to care for yourself and can answer questions that you have.  Educate yourself about your condition so you can make healthy choices about eating and physical activity.  Check your blood sugar (glucose) levels as often as directed. Your health care provider will help you decide how often to check your blood glucose level depending on your treatment goals and how well you are meeting them.  Ask your health care provider if you should take low-dose aspirin daily and what dose is recommended for you. Taking low-dose aspirin daily is recommended to help prevent cardiovascular disease. Do not use nicotine or tobacco Do not use any products that contain nicotine or tobacco, such as cigarettes and e-cigarettes. If you need help quitting, ask your health care provider. Nicotine raises your risk for diabetes problems. If you quit using nicotine:  You will lower your risk for heart attack, stroke, nerve disease, and kidney disease.  Your cholesterol and blood pressure may improve.  Your blood circulation will improve. Keep your blood pressure under control Your personal target blood pressure is determined based on:  Your age.  Your medicines.  How long you have had diabetes.  Any other medical conditions you have. To control your blood pressure:  Follow instructions from your health care provider about  meal planning, exercise, and medicines.  Make sure your health care provider checks your blood pressure at every medical visit.  Monitor your blood pressure at home as told by your health care provider.  Keep your cholesterol under control To control your cholesterol:  Follow instructions from your health care provider about meal planning, exercise, and medicines.  Have your cholesterol checked at least once a year.  You may be prescribed medicine to lower cholesterol (statin). If you are not taking a statin, ask your health care provider if you should  be. Controlling your cholesterol may:  Help prevent heart disease and stroke. These are the most common health problems for people with diabetes.  Improve your blood flow. Schedule and keep yearly physical exams and eye exams Your health care provider will tell you how often you need medical visits depending on your diabetes management plan. Keep all follow-up visits as directed. This is important so possible problems can be identified early and complications can be avoided or treated.  Every visit with your health care provider should include measuring your: ? Weight. ? Blood pressure. ? Blood glucose control.  Your A1c (hemoglobin A1c) level should be checked: ? At least 2 times a year, if you are meeting your treatment goals. ? 4 times a year, if you are not meeting treatment goals or if your treatment goals have changed.  Your blood lipids (lipid profile) should be checked yearly. You should also be checked yearly for protein in your urine (urine microalbumin).  If you have type 1 diabetes, get an eye exam 3-5 years after you are diagnosed, and then once a year after your first exam.  If you have type 2 diabetes, get an eye exam as soon as you are diagnosed, and then once a year after your first exam. Keep your vaccines current It is recommended that you receive:  A flu (influenza) vaccine every year.  A pneumonia (pneumococcal) vaccine and a hepatitis B vaccine. If you are age 62 or older, you may get the pneumonia vaccine as a series of two separate shots. Ask your health care provider which other vaccines may be recommended. Take care of your feet Diabetes may cause you to have poor blood circulation to your legs and feet. Because of this, taking care of your feet is very important. Diabetes can cause:  The skin on the feet to get thinner, break more easily, and heal more slowly.  Nerve damage in your legs and feet, which results in decreased feeling. You may not notice  minor injuries that could lead to serious problems. To avoid foot problems:  Check your skin and feet every day for cuts, bruises, redness, blisters, or sores.  Schedule a foot exam with your health care provider once every year. This exam includes: ? Inspecting of the structure and skin of your feet. ? Checking the pulses and sensation in your feet.  Make sure that your health care provider performs a visual foot exam at every medical visit.  Take care of your teeth People with poorly controlled diabetes are more likely to have gum (periodontal) disease. Diabetes can make periodontal diseases harder to control. If not treated, periodontal diseases can lead to tooth loss. To prevent this:  Brush your teeth twice a day.  Floss at least once a day.  Visit your dentist 2 times a year. Drink responsibly Limit alcohol intake to no more than 1 drink a day for nonpregnant women and 2 drinks a day for men.  One drink equals 12 oz of beer, 5 oz of wine, or 1 oz of hard liquor.  It is important to eat food when you drink alcohol to avoid low blood glucose (hypoglycemia). Avoid alcohol if you:  Have a history of alcohol abuse or dependence.  Are pregnant.  Have liver disease, pancreatitis, advanced neuropathy, or severe hypertriglyceridemia. Lessen stress Living with diabetes can be stressful. When you are experiencing stress, your blood glucose may be affected in two ways:  Stress hormones may cause your blood glucose to rise.  You may be distracted from taking good care of yourself. Be aware of your stress level and make changes to help you manage challenging situations. To lower your stress levels:  Consider joining a support group.  Do planned relaxation or meditation.  Do a hobby that you enjoy.  Maintain healthy relationships.  Exercise regularly.  Work with your health care provider or a mental health professional. Summary  You can take action to prevent or slow down  problems that are caused by diabetes (diabetes mellitus). Following your diabetes plan and taking care of yourself can reduce your risk of serious or life-threatening complications.  Follow instructions from your health care providers about managing your diabetes. Your diabetes may be managed by a team of health care providers who can teach you how to care for yourself and can answer questions that you have.  Your health care provider will tell you how often you need medical visits depending on your diabetes management plan. Keep all follow-up visits as directed. This is important so possible problems can be identified early and complications can be avoided or treated. This information is not intended to replace advice given to you by your health care provider. Make sure you discuss any questions you have with your health care provider. Document Released: 02/28/2011 Document Revised: 01/30/2017 Document Reviewed: 03/11/2016 Elsevier Interactive Patient Education  2019 [place].  Menopause Menopause is the normal time of life when menstrual periods stop completely. It is usually confirmed by 12 months without a menstrual period. The transition to menopause (perimenopause) most often happens between the ages of 87 and 36. During perimenopause, hormone levels change in your body, which can cause symptoms and affect your health. Menopause may increase your risk for:  Loss of bone (osteoporosis), which causes bone breaks (fractures).  Depression.  Hardening and narrowing of the arteries (atherosclerosis), which can cause heart attacks and strokes. What are the causes? This condition is usually caused by a natural change in hormone levels that happens as you get older. The condition may also be caused by surgery to remove both ovaries (bilateral oophorectomy). What increases the risk? This condition is more likely to start at an earlier age if you have certain medical conditions or treatments,  including:  A tumor of the pituitary gland in the brain.  A disease that affects the ovaries and hormone production.  Radiation treatment for cancer.  Certain cancer treatments, such as chemotherapy or hormone (anti-estrogen) therapy.  Heavy smoking and excessive alcohol use.  Family history of early menopause. This condition is also more likely to develop earlier in women who are very thin. What are the signs or symptoms? Symptoms of this condition include:  Hot flashes.  Irregular menstrual periods.  Night sweats.  Changes in feelings about sex. This could be a decrease in sex drive or an increased comfort around your sexuality.  Vaginal dryness and thinning of the vaginal walls. This may cause painful intercourse.  Dryness  of the skin and development of wrinkles.  Headaches.  Problems sleeping (insomnia).  Mood swings or irritability.  Memory problems.  Weight gain.  Hair growth on the face and chest.  Bladder infections or problems with urinating. How is this diagnosed? This condition is diagnosed based on your medical history, a physical exam, your age, your menstrual history, and your symptoms. Hormone tests may also be done. How is this treated? In some cases, no treatment is needed. You and your health care provider should make a decision together about whether treatment is necessary. Treatment will be based on your individual condition and preferences. Treatment for this condition focuses on managing symptoms. Treatment may include:  Menopausal hormone therapy (MHT).  Medicines to treat specific symptoms or complications.  Acupuncture.  Vitamin or herbal supplements. Before starting treatment, make sure to let your health care provider know if you have a personal or family history of:  Heart disease.  Breast cancer.  Blood clots.  Diabetes.  Osteoporosis. Follow these instructions at home: Lifestyle  Do not use any products that contain  nicotine or tobacco, such as cigarettes and e-cigarettes. If you need help quitting, ask your health care provider.  Get at least 30 minutes of physical activity on 5 or more days each week.  Avoid alcoholic and caffeinated beverages, as well as spicy foods. This may help prevent hot flashes.  Get 7-8 hours of sleep each night.  If you have hot flashes, try: ? Dressing in layers. ? Avoiding things that may trigger hot flashes, such as spicy food, warm places, or stress. ? Taking slow, deep breaths when a hot flash starts. ? Keeping a fan in your home and office.  Find ways to manage stress, such as deep breathing, meditation, or journaling.  Consider going to group therapy with other women who are having menopause symptoms. Ask your health care provider about recommended group therapy meetings. Eating and drinking  Eat a healthy, balanced diet that contains whole grains, lean protein, low-fat dairy, and plenty of fruits and vegetables.  Your health care provider may recommend adding more soy to your diet. Foods that contain soy include tofu, tempeh, and soy milk.  Eat plenty of foods that contain calcium and vitamin D for bone health. Items that are rich in calcium include low-fat milk, yogurt, beans, almonds, sardines, broccoli, and kale. Medicines  Take over-the-counter and prescription medicines only as told by your health care provider.  Talk with your health care provider before starting any herbal supplements. If prescribed, take vitamins and supplements as told by your health care provider. These may include: ? Calcium. Women age 27 and older should get 1,200 mg (milligrams) of calcium every day. ? Vitamin D. Women need 600-800 International Units of vitamin D each day. ? Vitamins B12 and B6. Aim for 50 micrograms of B12 and 1.5 mg of B6 each day. General instructions  Keep track of your menstrual periods, including: ? When they occur. ? How heavy they are and how long they  last. ? How much time passes between periods.  Keep track of your symptoms, noting when they start, how often you have them, and how long they last.  Use vaginal lubricants or moisturizers to help with vaginal dryness and improve comfort during sex.  Keep all follow-up visits as told by your health care provider. This is important. This includes any group therapy or counseling. Contact a health care provider if:  You are still having menstrual periods after age 18.  You have pain during sex.  You have not had a period for 12 months and you develop vaginal bleeding. Get help right away if:  You have: ? Severe depression. ? Excessive vaginal bleeding. ? Pain when you urinate. ? A fast or irregular heart beat (palpitations). ? Severe headaches. ? Abdomen (abdominal) pain or severe indigestion.  You fell and you think you have a broken bone.  You develop leg or chest pain.  You develop vision problems.  You feel a lump in your breast. Summary  Menopause is the normal time of life when menstrual periods stop completely. It is usually confirmed by 12 months without a menstrual period.  The transition to menopause (perimenopause) most often happens between the ages of 19 and 75.  Symptoms can be managed through medicines, lifestyle changes, and complementary therapies such as acupuncture.  Eat a balanced diet that is rich in nutrients to promote bone health and heart health and to manage symptoms during menopause. This information is not intended to replace advice given to you by your health care provider. Make sure you discuss any questions you have with your health care provider. Document Released: 09/02/2003 Document Revised: 07/15/2016 Document Reviewed: 07/15/2016 Elsevier Interactive Patient Education  2019 [employer].

## 2018-12-04 NOTE — Progress Notes (Signed)
Subjective:     [name] is a 48 y.o. female and is here for a comprehensive physical exam. The patient reports no problems.  Pt continues to follow with psychiatry.  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  Currently on Oxycontin ER BID and Percocet QID prn.  Pt states she has been having constipation from the medications.     Pt seeing Dermatology for skin lesions on breast/chest.  States has special bandaids she puts on the area, but they are expensive.  Pt taking metformin ER and onglyza 5 mg.  Denies hypoglycemia.  Taking pravastatin 40 mg.  Denies myalgias.  Pt inquires about if she is in menopause.  Pt having hot flashes.  Pt has s/p hysterectomy in 2012.  States has one ovary.  Social History   Socioeconomic History  . Marital status: Married    Spouse name: Not on file  . Number of children: 2  . Years of education: 2112  . Highest education level: Not on file  Occupational History  . Occupation: Disability  Social Needs  . Financial resource strain: Not on file  . Food insecurity:    Worry: Not on file    Inability: Not on file  . Transportation needs:    Medical: Not on file    Non-medical: Not on file  Tobacco Use  . Smoking status: Former Smoker    Packs/day: 1.50    Years: 13.00    Pack years: 19.50    Types: Cigarettes    Last attempt to quit: 08/20/2016    Years since quitting: 2.2  . Smokeless tobacco: Never Used  Substance and Sexual Activity  . Alcohol use: No    Alcohol/week: 0.0 standard drinks  . Drug use: No  . Sexual activity: Not on file  Lifestyle  . Physical activity:    Days per week: Not on file    Minutes per session: Not on file  . Stress: Not on file  Relationships  . Social connections:    Talks on phone: Not on file    Gets together: Not on file    Attends religious service: Not on file    Active member of club or organization: Not on file    Attends meetings of clubs or  organizations: Not on file    Relationship status: Not on file  . Intimate partner violence:    Fear of current or ex partner: Not on file    Emotionally abused: Not on file    Physically abused: Not on file    Forced sexual activity: Not on file  Other Topics Concern  . Not on file  Social History Narrative   Fun: Anything with family.   Feels safe at home and denies abuse   Health Maintenance  Topic Date Due  . PAP SMEAR-Modifier  07/29/2012  . OPHTHALMOLOGY EXAM  10/30/2016  . HEMOGLOBIN A1C  12/07/2017  . URINE MICROALBUMIN  02/08/2018  . FOOT EXAM  07/24/2018  . HIV Screening  02/23/2029 (Originally 01/25/1986)  . INFLUENZA VACCINE  01/25/2019  . TETANUS/TDAP  12/10/2021  . PNEUMOCOCCAL POLYSACCHARIDE VACCINE AGE 78-64 HIGH RISK  Completed    The following portions of the patient's history were reviewed and updated as appropriate: allergies, current medications, past family history, past medical history, past social history, past surgical history and problem list.  Review of Systems Pertinent items noted in HPI and remainder of comprehensive ROS otherwise negative.  Objective:    BP 112/64 (BP Location: Left Arm, Patient Position: Sitting, Cuff Size: Large)   Pulse 68   Temp 98.2 F (36.8 C) (Oral)   Wt 234 lb (106.1 kg)   LMP 11/12/2010   SpO2 96%   BMI 37.77 kg/m  General appearance: alert, cooperative and no distress Head: Normocephalic, without obvious abnormality, atraumatic Eyes: conjunctivae/corneas clear. PERRL, EOM's intact. Fundi benign. Ears: normal TM's and external ear canals both ears Nose: Nares normal. Septum midline. Mucosa normal. No drainage or sinus tenderness., no discharge Throat: lips, mucosa, and tongue normal; teeth and gums normal Neck: no adenopathy, no carotid bruit, no JVD, supple, symmetrical, trachea midline and thyroid not enlarged, symmetric, no tenderness/mass/nodules Lungs: clear to auscultation bilaterally Heart: regular rate  and rhythm, S1, S2 normal, no murmur, click, rub or gallop Abdomen: soft, non-tender; bowel sounds normal; no masses,  no organomegaly Pelvic: pt declined Extremities: extremities normal, atraumatic, no cyanosis or edema Pulses: 2+ and symmetric Skin: warm, dry, intact.  numerous hyperpigmented lesions and areas in various stages of healing on upper extremities and upper chest. Neurologic: Alert and oriented X 3, normal strength and tone. Normal symmetric reflexes. Normal coordination and gait    Assessment:    Healthy female exam.      Plan:     Anticipatory guidance given including wearing seatbelts, smoke detectors in the home, increasing physical activity, increasing p.o. intake of water and vegetables. -will obtain labs -pt with h/o hysterectomy, declined pelvic exam See After Visit Summary for Counseling Recommendations    DM II -continue current meds -discussed lifestyle modifications -will obtain hgb A1C, microalbumin/creat ratio  HLD -continue pravastatin 40 mg -lipid panel  Pelvic Pain -likely 2/2 mesh placement -continue f/u with [facility] -continue current meds:  Oxycontin ER BID and Percocet QID prn  Opioid induced constipation -discussed using miralax. -advised to discussed with Pain management.  Hot flashes -allergy to gabapentin -consider black coash -encouraged to f/u with OB/Gyn -TSH, free T4  F/u prn  Grier Mitts, MD

## 2018-12-05 ENCOUNTER — Encounter: Payer: Self-pay | Admitting: Family Medicine

## 2018-12-20 NOTE — Addendum Note (Signed)
Addended by: Grier Mitts R on: 12/20/2018 04:17 PM   Modules accepted: Level of Service

## 2019-01-20 ENCOUNTER — Encounter: Payer: Self-pay | Admitting: Family Medicine

## 2019-01-21 ENCOUNTER — Ambulatory Visit (INDEPENDENT_AMBULATORY_CARE_PROVIDER_SITE_OTHER): Payer: Medicare Other | Admitting: Family Medicine

## 2019-01-21 ENCOUNTER — Other Ambulatory Visit: Payer: Self-pay

## 2019-01-21 ENCOUNTER — Encounter: Payer: Self-pay | Admitting: Family Medicine

## 2019-01-21 DIAGNOSIS — B372 Candidiasis of skin and nail: Secondary | ICD-10-CM | POA: Diagnosis not present

## 2019-01-21 MED ORDER — NYSTATIN 100000 UNIT/GM EX CREA: 1.0000 "application " | TOPICAL_CREAM | Freq: Two times a day (BID) | CUTANEOUS | 0 refills | Status: DC
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
## 2019-02-24 NOTE — Telephone Encounter (Signed)
Pt was seen by Dr Volanda Napoleon in the office on 01/21/2019

## 2019-04-02 ENCOUNTER — Ambulatory Visit: Payer: Medicare Other | Admitting: Family Medicine

## 2019-04-02 DIAGNOSIS — Z0289 Encounter for other administrative examinations: Secondary | ICD-10-CM

## 2019-04-11 ENCOUNTER — Encounter: Payer: Self-pay | Admitting: Family Medicine

## 2019-04-21 ENCOUNTER — Other Ambulatory Visit: Payer: Self-pay | Admitting: Family Medicine

## 2019-04-21 DIAGNOSIS — B372 Candidiasis of skin and nail: Secondary | ICD-10-CM

## 2019-06-09 ENCOUNTER — Other Ambulatory Visit: Payer: Self-pay | Admitting: Family Medicine

## 2019-06-09 DIAGNOSIS — B372 Candidiasis of skin and nail: Secondary | ICD-10-CM

## 2019-08-10 ENCOUNTER — Other Ambulatory Visit: Payer: Self-pay | Admitting: Family Medicine

## 2019-08-10 DIAGNOSIS — B372 Candidiasis of skin and nail: Secondary | ICD-10-CM

## 2019-09-11 ENCOUNTER — Other Ambulatory Visit: Payer: Self-pay | Admitting: Family Medicine

## 2019-09-11 DIAGNOSIS — B372 Candidiasis of skin and nail: Secondary | ICD-10-CM

## 2019-10-08 ENCOUNTER — Other Ambulatory Visit: Payer: Self-pay | Admitting: Family Medicine

## 2019-10-08 DIAGNOSIS — B372 Candidiasis of skin and nail: Secondary | ICD-10-CM

## 2020-01-15 ENCOUNTER — Telehealth: Payer: Self-pay | Admitting: *Deleted

## 2020-01-15 ENCOUNTER — Encounter: Payer: Self-pay | Admitting: Neurology

## 2020-01-15 ENCOUNTER — Ambulatory Visit: Payer: Self-pay | Admitting: Neurology

## 2020-01-15 NOTE — Telephone Encounter (Signed)
No showed new patient appointment. 

## 2020-02-02 ENCOUNTER — Encounter: Payer: Self-pay | Admitting: Family Medicine

## 2020-02-02 ENCOUNTER — Ambulatory Visit: Payer: Medicare Other | Admitting: Family Medicine

## 2020-02-03 NOTE — Telephone Encounter (Signed)
Pt was rescheduled for 02/06/2020

## 2020-02-06 ENCOUNTER — Ambulatory Visit: Payer: Medicare Other | Admitting: Family Medicine

## 2020-02-06 DIAGNOSIS — Z0289 Encounter for other administrative examinations: Secondary | ICD-10-CM

## 2020-08-03 ENCOUNTER — Telehealth: Payer: Self-pay | Admitting: Family Medicine

## 2020-08-03 NOTE — Telephone Encounter (Signed)
Left message for patient to call back and schedule Medicare Annual Wellness Visit (AWV) either virtually or in office.   Last AWV 02/08/17  please schedule at anytime with [facility] Nurse Health Advisor 1 or 2   This should be a 45 minute visit. Patient needs appointment with pcp last appointment 01/21/2019

## 2020-09-24 ENCOUNTER — Telehealth: Payer: Self-pay | Admitting: Family Medicine

## 2020-09-24 NOTE — Telephone Encounter (Signed)
Left message for patient to call back and schedule Medicare Annual Wellness Visit (AWV) either virtually or in office. No detailed message   Last AWV 02/08/17 ; please schedule at anytime with [facility] Nurse Health Advisor 1 or 2  Patient also needs appointment with pcp last appointment 02/08/17  This should be a 45 minute visit.

## 2021-02-23 ENCOUNTER — Institutional Professional Consult (permissible substitution): Payer: Medicare Other | Admitting: Plastic Surgery

## 2023-07-20 DIAGNOSIS — K623 Rectal prolapse: Secondary | ICD-10-CM | POA: Insufficient documentation

## 2023-07-26 DIAGNOSIS — M6289 Other specified disorders of muscle: Secondary | ICD-10-CM | POA: Insufficient documentation

## 2023-10-06 ENCOUNTER — Emergency Department (HOSPITAL_BASED_OUTPATIENT_CLINIC_OR_DEPARTMENT_OTHER)

## 2023-10-06 ENCOUNTER — Emergency Department (HOSPITAL_BASED_OUTPATIENT_CLINIC_OR_DEPARTMENT_OTHER)
Admission: EM | Admit: 2023-10-06 | Discharge: 2023-10-06 | Disposition: A | Discharge status: Home / Self Care | Attending: Emergency Medicine | Admitting: Emergency Medicine

## 2023-10-06 ENCOUNTER — Encounter (HOSPITAL_BASED_OUTPATIENT_CLINIC_OR_DEPARTMENT_OTHER): Payer: Self-pay | Admitting: Emergency Medicine

## 2023-10-06 DIAGNOSIS — E119 Type 2 diabetes mellitus without complications: Secondary | ICD-10-CM | POA: Insufficient documentation

## 2023-10-06 DIAGNOSIS — K5641 Fecal impaction: Secondary | ICD-10-CM | POA: Insufficient documentation

## 2023-10-06 DIAGNOSIS — K5900 Constipation, unspecified: Secondary | ICD-10-CM | POA: Insufficient documentation

## 2023-10-06 LAB — COMPREHENSIVE METABOLIC PANEL WITH GFR
ALT: 34 U/L (ref 0–44)
AST: 24 U/L (ref 15–41)
Albumin: 4 g/dL (ref 3.5–5.0)
Alkaline Phosphatase: 64 U/L (ref 38–126)
Anion gap: 10 (ref 5–15)
BUN: 18 mg/dL (ref 6–20)
CO2: 27 mmol/L (ref 22–32)
Calcium: 9.2 mg/dL (ref 8.9–10.3)
Chloride: 101 mmol/L (ref 98–111)
Creatinine, Ser: 0.61 mg/dL (ref 0.44–1.00)
GFR, Estimated: >60 mL/min (ref >60)
Glucose, Bld: 143 mg/dL — ABNORMAL HIGH (ref 70–99)
Potassium: 4.1 mmol/L (ref 3.5–5.1)
Sodium: 138 mmol/L (ref 135–145)
Total Bilirubin: 0.6 mg/dL (ref 0.0–1.2)
Total Protein: 7.2 g/dL (ref 6.5–8.1)

## 2023-10-06 LAB — CBC
HCT: 39.8 % (ref 36.0–46.0)
Hemoglobin: 13.4 g/dL (ref 12.0–15.0)
MCH: 30.7 pg (ref 26.0–34.0)
MCHC: 33.7 g/dL (ref 30.0–36.0)
MCV: 91.3 fL (ref 80.0–100.0)
Platelets: 231 10*3/uL (ref 150–400)
RBC: 4.36 MIL/uL (ref 3.87–5.11)
RDW: 12.8 % (ref 11.5–15.5)
WBC: 6.7 10*3/uL (ref 4.0–10.5)
nRBC: 0 % (ref 0.0–0.2)

## 2023-10-06 LAB — LIPASE, BLOOD: Lipase: 27 U/L (ref 11–51)

## 2023-10-06 MED ORDER — IOHEXOL 300 MG/ML  SOLN
100.0000 mL | Freq: Once | INTRAMUSCULAR | Status: AC | PRN
  Administered 2023-10-06: 100 mL via INTRAVENOUS

## 2023-10-06 MED ORDER — MORPHINE SULFATE (PF) 4 MG/ML IV SOLN
4.0000 mg | Freq: Once | INTRAVENOUS | Status: AC
  Administered 2023-10-06: 4 mg via INTRAVENOUS
  Filled 2023-10-06: qty 1

## 2023-10-06 MED ORDER — LIDOCAINE HCL URETHRAL/MUCOSAL 2 % EX GEL
1.0000 | Freq: Once | CUTANEOUS | Status: AC
  Administered 2023-10-06: 1
  Filled 2023-10-06: qty 11

## 2023-10-06 MED ORDER — LORAZEPAM 2 MG/ML IJ SOLN
2.0000 mg | Freq: Once | INTRAMUSCULAR | Status: AC
  Administered 2023-10-06: 2 mg via INTRAVENOUS
  Filled 2023-10-06: qty 1

## 2023-10-06 MED ORDER — FENTANYL CITRATE PF 50 MCG/ML IJ SOSY
100.0000 ug | PREFILLED_SYRINGE | Freq: Once | INTRAMUSCULAR | Status: AC
  Administered 2023-10-06: 100 ug via INTRAVENOUS
  Filled 2023-10-06: qty 2

## 2023-10-06 NOTE — Discharge Instructions (Signed)
 You should drink 50 to 64 ounces of water every day, you should take at least MiraLAX every day, you can use your other constipation medication as prescribed to help prevent constipation in the future.

## 2023-10-06 NOTE — ED Provider Notes (Signed)
 [facility] Provider Note   CSN: [id number] Arrival date & time: 10/06/23  1348     History  Chief Complaint  Patient presents with   Constipation    [name] is a 53 y.o. female with past medical history significant for diabetes, anxiety, depression, tobacco abuse, obsessive-compulsive disorder, psychosomatic disorder, interstitial cystitis, with recent constipation, rectal prolapse, rectocele who presents with concern for impaction.  Patient reports that she had an x-ray earlier today and it showed a large stool ball, she was told that she would not be able to pass the stool on her own.  She cannot tell me when the last time she had a bowel movement or pass gas was.  She rates her pain 8/10.  She feels like the constipation is worsening her interstitial cystitis.   Constipation      Home Medications Prior to Admission medications   Medication Sig Start Date End Date Taking? Authorizing Provider  baclofen (LIORESAL) 20 MG tablet Take 20 mg by mouth 2 (two) times daily.     [provider]  cetirizine (ZYRTEC) 10 MG chewable tablet Chew 10 mg by mouth daily.    [provider]  citalopram (CELEXA) 20 MG tablet Take 20 mg by mouth daily. 04/17/17   [provider]  clonazePAM (KLONOPIN) 1 MG tablet Take 1 mg by mouth 3 (three) times daily.     [provider]  fluticasone (FLONASE) 50 MCG/ACT nasal spray PLACE 1 SPRAY INTO BOTH NOSTRILS 2 TIMES DAILY 09/11/16   [place], Betty G, MD  glucose blood (BAYER CONTOUR TEST) test strip 1 each by Other route daily. And lancets 1/day 10/20/16   Gwyndolyn Lerner, MD  hydrocortisone (ANUSOL-HC) 2.5 % rectal cream Place 1 application rectally daily as needed for itching.  03/23/15   [provider]  hydrOXYzine (VISTARIL) 50 MG capsule TAKE ONE CAPSULE BY MOUTH TWICE A DAY AS NEEDED 11/09/14   Calone, Gregory D, FNP  Lancets (FREESTYLE) lancets Use 1 lancets  per test to test blood sugar 2 times daily. 05/29/16   Kordsmeier, Julia, FNP  lidocaine (LIDODERM) 5 % Remove & Discard patch within 12 hours or as directed by MD 09/14/15   [provider]  lidocaine (XYLOCAINE) 5 % ointment Apply daily to urethra 08/24/15   [provider]  metFORMIN (GLUCOPHAGE-XR) 500 MG 24 hr tablet TAKE 2 TABLETS BY MOUTH EVERY DAY WITH BREAKFAST 07/29/18   Gwyndolyn Lerner, MD  nystatin cream (MYCOSTATIN) APPLY TO AFFECTED AREA TWICE A DAY 10/08/19   Viola Greulich, MD  OVER THE COUNTER MEDICATION Take 2 tablets by mouth daily as needed (IC flare ups). cystoprotek    [provider]  oxyCODONE-acetaminophen (PERCOCET) 10-325 MG tablet Take 1 tablet by mouth every 4 (four) hours as needed for pain.    [provider]  polyethylene glycol (MIRALAX / GLYCOLAX) packet Take 17 g by mouth daily. 09/22/14   Street, Broughton, PA-C  pravastatin (PRAVACHOL) 40 MG tablet TAKE 1 TABLET EVERY DAY 03/02/17   Viola Greulich, MD  ranitidine (ZANTAC) 150 MG tablet Take 150-300 mg by mouth daily as needed for heartburn.     [provider]  saxagliptin HCl (ONGLYZA) 5 MG TABS tablet Take 0.5 tablets (2.5 mg total) by mouth daily. 08/01/17   Gwyndolyn Lerner, MD  Suppository Base MISC 1 capsule by Does not apply route. As needed    [provider]  traZODone (DESYREL) 50 MG tablet  01/19/17   [provider]  triamcinolone ointment (KENALOG) 0.1 % Apply 1 application topically daily as needed (rash).  03/03/15   [provider]  vitamin B-12 (CYANOCOBALAMIN) 500 MCG tablet Take 500 mcg by mouth daily.    [provider]      Allergies    Colesevelam, Macrobid [nitrofurantoin monohyd macro], Nitrofuran derivatives, Sulfa antibiotics, Brexpiprazole, Codeine, Doxepin, Doxycycline, Duloxetine, Gabapentin, and Pregabalin    Review of Systems   Review of Systems  Gastrointestinal:  Positive for constipation.  All other systems  reviewed and are negative.   Physical Exam Updated Vital Signs BP 108/77 (BP Location: Right Arm)   Pulse 81   Temp 98.1 F (36.7 C)   Resp 18   Ht 5\' 6"  (1.676 m)   Wt 76.2 kg   LMP 11/12/2010   SpO2 96%   BMI 27.12 kg/m  Physical Exam Vitals and nursing note reviewed.  Constitutional:      General: She is not in acute distress.    Appearance: Normal appearance.  HENT:     Head: Normocephalic and atraumatic.  Eyes:     General:        Right eye: No discharge.        Left eye: No discharge.  Cardiovascular:     Rate and Rhythm: Normal rate and regular rhythm.     Heart sounds: No murmur heard.    No friction rub. No gallop.  Pulmonary:     Effort: Pulmonary effort is normal.     Breath sounds: Normal breath sounds.  Abdominal:     General: Bowel sounds are normal.     Palpations: Abdomen is soft.     Comments: Tender throughout without overt distention, worst ttp over suprapubic region.   Skin:    General: Skin is warm and dry.     Capillary Refill: Capillary refill takes less than 2 seconds.  Neurological:     Mental Status: She is alert and oriented to person, place, and time.  Psychiatric:        Mood and Affect: Mood normal.        Behavior: Behavior normal.     ED Results / Procedures / Treatments   Labs (all labs ordered are listed, but only abnormal results are displayed) Labs Reviewed  COMPREHENSIVE METABOLIC PANEL WITH GFR - Abnormal; Notable for the following components:      Result Value   Glucose, Bld 143 (*)    All other components within normal limits  LIPASE, BLOOD  CBC  URINALYSIS, ROUTINE W REFLEX MICROSCOPIC    EKG None  Radiology CT ABDOMEN PELVIS W CONTRAST Result Date: 10/06/2023 CLINICAL DATA:  Acute abdominal pain.  Bowel obstruction suspected. EXAM: CT ABDOMEN AND PELVIS WITH CONTRAST TECHNIQUE: Multidetector CT imaging of the abdomen and pelvis was performed using the standard protocol following bolus administration of  intravenous contrast. RADIATION DOSE REDUCTION: This exam was performed according to the departmental dose-optimization program which includes automated exposure control, adjustment of the mA and/or kV according to patient size and/or use of iterative reconstruction technique. CONTRAST:  OMNIPAQUE IOHEXOL 300 MG/ML  SOLN COMPARISON:  CT abdomen and pelvis 07/25/2023 FINDINGS: Lower chest: No acute abnormality. Hepatobiliary: No focal liver abnormality is seen. Status post cholecystectomy. No biliary dilatation. Pancreas: Unremarkable. No pancreatic ductal dilatation or surrounding inflammatory changes. Spleen: Normal in size without focal abnormality. Adrenals/Urinary Tract: There are punctate calculi in the inferior pole the right kidney. Otherwise, kidneys, adrenal glands and  bladder are within normal limits. Stomach/Bowel: Stomach is within normal limits. Appendix appears normal. No evidence of bowel wall thickening, distention, or inflammatory changes. There is a large amount of stool throughout the entire colon. Vascular/Lymphatic: No significant vascular findings are present. No enlarged abdominal or pelvic lymph nodes. Reproductive: Status post hysterectomy. No adnexal masses. Rounded midline hyperdense oval area along the anterior wall of the vagina measures 8 x 13 by 14 mm and is unchanged. Other: There is a small fat containing umbilical hernia. There is no ascites. Musculoskeletal: No acute or significant osseous findings. IMPRESSION: 1. No acute localizing process in the abdomen or pelvis. 2. Large amount of stool throughout the colon. 3. Nonobstructing right renal calculi. 4. Stable hyperdense oval area along the anterior wall of the vagina. Recommend correlation with physical examination and gynecology follow-up. Electronically Signed   By: Tyron Gallon [name]   On: 10/06/2023 15:47    Procedures .Fecal disimpaction  Date/Time: 10/06/2023 4:58 PM  Performed by: Nelly Banco,  PA-C Authorized by: Nelly Banco, PA-C  Consent: Verbal consent obtained. Risks and benefits: risks, benefits and alternatives were discussed Consent given by: patient Patient understanding: patient states understanding of the procedure being performed Imaging studies: imaging studies available Patient identity confirmed: verbally with patient Preparation: Patient was prepped and draped in the usual sterile fashion. Local anesthesia used: yes Anesthesia method: Uro-Jet topical lidocaine.  Anesthesia: Local anesthesia used: yes Local Anesthetic: topical anesthetic  Sedation: Patient sedated: no  Patient tolerance: patient tolerated the procedure well with no immediate complications       Medications Ordered in ED Medications  morphine (PF) 4 MG/ML injection 4 mg (4 mg Intravenous Given 10/06/23 1438)  iohexol (OMNIPAQUE) 300 MG/ML solution 100 mL (100 mLs Intravenous Contrast Given 10/06/23 1536)  morphine (PF) 4 MG/ML injection 4 mg (4 mg Intravenous Given 10/06/23 1615)  LORazepam (ATIVAN) injection 2 mg (2 mg Intravenous Given 10/06/23 1616)  lidocaine (XYLOCAINE) 2 % jelly 1 Application (1 Application Other Given 10/06/23 1617)  fentaNYL (SUBLIMAZE) injection 100 mcg (100 mcg Intravenous Given 10/06/23 1645)    ED Course/ Medical Decision Making/ A&P                                 Medical Decision Making Amount and/or Complexity of Data Reviewed Labs: ordered. Radiology: ordered.  Risk Prescription drug management.   This patient is a 53 y.o. female  who presents to the ED for concern of abdominal pain, constipations.   Differential diagnoses prior to evaluation: The emergent differential diagnosis includes, but is not limited to,  The causes of generalized abdominal pain include but are not limited to AAA, mesenteric ischemia, appendicitis, diverticulitis, DKA, gastritis, gastroenteritis, AMI, nephrolithiasis, pancreatitis, peritonitis, adrenal  insufficiency,lead poisoning, iron toxicity, intestinal ischemia, constipation, UTI,SBO/LBO, splenic rupture, biliary disease, IBD, IBS, PUD, or hepatitis --with patient history, lab work, imaging from her recent urgent care visit more suspicious for acute fecal impaction, versus developing bowel obstruction, versus other.. This is not an exhaustive differential.   Past Medical History / Co-morbidities / Social History:  diabetes, anxiety, depression, tobacco abuse, obsessive-compulsive disorder, psychosomatic disorder, interstitial cystitis, with recent constipation, rectal prolapse, rectocele  Additional history: Chart reviewed. Pertinent results include: Reviewed significant outpatient labs, imaging, recent previous admission, she has very extensive pelvic floor dysfunction, and chronic constipation.  Physical Exam: Physical exam performed. The pertinent findings include: No significant abdominal distention, but some  focal tenderness in the suprapubic region, vital signs stable in the ED.  Lab Tests/Imaging studies: I personally interpreted labs/imaging and the pertinent results include: CBC unremarkable, CMP overall unremarkable, normal lipase.. CT abd pelvis shows  1. No acute localizing process in the abdomen or pelvis.  2. Large amount of stool throughout the colon.  3. Nonobstructing right renal calculi.  4. Stable hyperdense oval area along the anterior wall of the  vagina. Recommend correlation with physical examination and  gynecology follow-up.   Will proceed with attempted disimpaction I agree with the radiologist interpretation.  Medications: I ordered medication including morphine for pain.  I have reviewed the patients home medicines and have made adjustments as needed.   Disposition: After consideration of the diagnostic results and the patients response to treatment, I feel that patient was successfully disimpacted in the emergency department with some relief, will  discharge with plan for aggressive bowel regimen at home and close follow-up.Aaron Aas   emergency department workup does not suggest an emergent condition requiring admission or immediate intervention beyond what has been performed at this time. The plan is: as above. The patient is safe for discharge and has been instructed to return immediately for worsening symptoms, change in symptoms or any other concerns.  Final Clinical Impression(s) / ED Diagnoses Final diagnoses:  Fecal impaction in rectum (HCC)  Constipation, unspecified constipation type    Rx / DC Orders ED Discharge Orders     None         Nelly Banco, PA-C 10/06/23 1659    Lowery Rue, DO 10/07/23 [phone]

## 2023-10-06 NOTE — ED Triage Notes (Signed)
 Pt reports she has a rectocele and is now impacted; sts she had an XR (?) yesterday and was told she would not be able to pass the stool on her own

## 2023-10-12 ENCOUNTER — Encounter (HOSPITAL_BASED_OUTPATIENT_CLINIC_OR_DEPARTMENT_OTHER): Payer: Self-pay

## 2023-10-12 ENCOUNTER — Emergency Department (HOSPITAL_BASED_OUTPATIENT_CLINIC_OR_DEPARTMENT_OTHER)
Admission: EM | Admit: 2023-10-12 | Discharge: 2023-10-12 | Disposition: A | Discharge status: Home / Self Care | Attending: Emergency Medicine | Admitting: Emergency Medicine

## 2023-10-12 DIAGNOSIS — R42 Dizziness and giddiness: Secondary | ICD-10-CM | POA: Diagnosis not present

## 2023-10-12 DIAGNOSIS — Z7984 Long term (current) use of oral hypoglycemic drugs: Secondary | ICD-10-CM | POA: Diagnosis not present

## 2023-10-12 DIAGNOSIS — K6289 Other specified diseases of anus and rectum: Secondary | ICD-10-CM | POA: Diagnosis present

## 2023-10-12 DIAGNOSIS — K5641 Fecal impaction: Secondary | ICD-10-CM | POA: Diagnosis not present

## 2023-10-12 DIAGNOSIS — E1165 Type 2 diabetes mellitus with hyperglycemia: Secondary | ICD-10-CM | POA: Diagnosis not present

## 2023-10-12 DIAGNOSIS — K5900 Constipation, unspecified: Secondary | ICD-10-CM

## 2023-10-12 LAB — CBC
HCT: 37.6 % (ref 36.0–46.0)
Hemoglobin: 12.5 g/dL (ref 12.0–15.0)
MCH: 30.3 pg (ref 26.0–34.0)
MCHC: 33.2 g/dL (ref 30.0–36.0)
MCV: 91 fL (ref 80.0–100.0)
Platelets: 241 10*3/uL (ref 150–400)
RBC: 4.13 MIL/uL (ref 3.87–5.11)
RDW: 13 % (ref 11.5–15.5)
WBC: 6 10*3/uL (ref 4.0–10.5)
nRBC: 0 % (ref 0.0–0.2)

## 2023-10-12 LAB — BASIC METABOLIC PANEL WITH GFR
Anion gap: 10 (ref 5–15)
BUN: 19 mg/dL (ref 6–20)
CO2: 27 mmol/L (ref 22–32)
Calcium: 9.7 mg/dL (ref 8.9–10.3)
Chloride: 100 mmol/L (ref 98–111)
Creatinine, Ser: 0.58 mg/dL (ref 0.44–1.00)
GFR, Estimated: >60 mL/min (ref >60)
Glucose, Bld: 121 mg/dL — ABNORMAL HIGH (ref 70–99)
Potassium: 4.1 mmol/L (ref 3.5–5.1)
Sodium: 137 mmol/L (ref 135–145)

## 2023-10-12 LAB — OCCULT BLOOD X 1 CARD TO LAB, STOOL: Fecal Occult Bld: NEGATIVE

## 2023-10-12 LAB — CBG MONITORING, ED: Glucose-Capillary: 112 mg/dL — ABNORMAL HIGH (ref 70–99)

## 2023-10-12 MED ORDER — ONDANSETRON 4 MG PO TBDP
4.0000 mg | ORAL_TABLET | Freq: Once | ORAL | Status: AC
  Administered 2023-10-12: 4 mg via ORAL
  Filled 2023-10-12: qty 1

## 2023-10-12 MED ORDER — HYDROCODONE-ACETAMINOPHEN 5-325 MG PO TABS
1.0000 | ORAL_TABLET | Freq: Once | ORAL | Status: AC
  Administered 2023-10-12: 1 via ORAL
  Filled 2023-10-12: qty 1

## 2023-10-12 MED ORDER — MECLIZINE HCL 25 MG PO TABS
25.0000 mg | ORAL_TABLET | Freq: Once | ORAL | Status: AC
  Administered 2023-10-12: 25 mg via ORAL
  Filled 2023-10-12: qty 1

## 2023-10-12 MED ORDER — POLYETHYLENE GLYCOL 3350 17 G PO PACK: 17.0000 g | PACK | ORAL | 0 refills | Status: AC

## 2023-10-12 MED ORDER — FLEET ENEMA RE ENEM: 1.0000 | ENEMA | Freq: Once | RECTAL | 0 refills | Status: AC

## 2023-10-12 MED ORDER — LIDOCAINE VISCOUS HCL 2 % MT SOLN
15.0000 mL | Freq: Once | OROMUCOSAL | Status: AC
  Administered 2023-10-12: 15 mL via TOPICAL
  Filled 2023-10-12: qty 15

## 2023-10-12 MED ORDER — ONDANSETRON 4 MG PO TBDP: 4.0000 mg | ORAL_TABLET | Freq: Three times a day (TID) | ORAL | 0 refills | Status: AC | PRN

## 2023-10-12 NOTE — ED Notes (Signed)
 Provided patient with a shasta lemon lime drink.

## 2023-10-12 NOTE — ED Triage Notes (Signed)
 Pt states that she is having some dizziness that started yesterday. Pt states that she has been having episodes come and go. Also reports that she was seen last week for constipation. Has the sensation that her rectum is dilated and is currently sitting on an ice pack.

## 2023-10-12 NOTE — Discharge Instructions (Signed)
 As discussed, I do think that you are significantly constipated.  Will place you on MiraLAX .  Recommend beginning with 2 to 4 packets and increasing if you still feel constipated to decrease if you develop diarrhea.  Recommend beginning a fiber supplement as well as maintaining oral hydration.  Follow-up with your primary care for reassessment.  Please do not hesitate to return if the worrisome signs and symptoms we discussed become apparent.

## 2023-10-12 NOTE — ED Provider Notes (Signed)
 [place] [facility] Provider Note   CSN: [id number] Arrival date & time: 10/12/23  1609     History  Chief Complaint  Patient presents with   Dizziness    [name] is a 53 y.o. female.   Dizziness   53 year old female presents emergency department with a couple different complaints.  States that she is having rectal fullness.  States she states had a bowel movement for the past week.  Has not tried anything medication for this specifically.  Does report crampy type abdominal pain.  Was seen in the emergency department for similar complaint 10/06/23 and was disimpacted with relief.  Patient states this feels similar or slightly worsening rectal pain.  Patient also reports feelings of room spinning dizziness.  This has happened over the past couple of days.  Symptoms exacerbated with increased rectal pain.  Denies any visual disturbance, gait abnormality, slurred speech, difficulty swallowing, facial droop, weakness/deficit of lower extremities.  Past medical history significant for diabetes mellitus type 2, IBS, hyperlipidemia, GERD, GAD  Home Medications Prior to Admission medications   Medication Sig Start Date End Date Taking? Authorizing Provider  baclofen (LIORESAL) 20 MG tablet Take 20 mg by mouth 2 (two) times daily.     [provider]  cetirizine  (ZYRTEC ) 10 MG chewable tablet Chew 10 mg by mouth daily.    [provider]  citalopram  (CELEXA ) 20 MG tablet Take 20 mg by mouth daily. 04/17/17   [provider]  clonazePAM (KLONOPIN) 1 MG tablet Take 1 mg by mouth 3 (three) times daily.     [provider]  fluticasone  (FLONASE ) 50 MCG/ACT nasal spray PLACE 1 SPRAY INTO BOTH NOSTRILS 2 TIMES DAILY 09/11/16   [place], Betty G, MD  glucose blood (BAYER CONTOUR TEST) test strip 1 each by Other route daily. And lancets 1/day 10/20/16   Gwyndolyn Lerner, MD  hydrocortisone (ANUSOL-HC) 2.5 % rectal cream Place 1  application rectally daily as needed for itching.  03/23/15   [provider]  hydrOXYzine  (VISTARIL ) 50 MG capsule TAKE ONE CAPSULE BY MOUTH TWICE A DAY AS NEEDED 11/09/14   Calone, Gregory D, FNP  Lancets (FREESTYLE) lancets Use 1 lancets per test to test blood sugar 2 times daily. 05/29/16   Kordsmeier, Julia, FNP  lidocaine  (LIDODERM ) 5 % Remove & Discard patch within 12 hours or as directed by MD 09/14/15   [provider]  lidocaine  (XYLOCAINE ) 5 % ointment Apply daily to urethra 08/24/15   [provider]  metFORMIN  (GLUCOPHAGE -XR) 500 MG 24 hr tablet TAKE 2 TABLETS BY MOUTH EVERY DAY WITH BREAKFAST 07/29/18   Gwyndolyn Lerner, MD  nystatin  cream (MYCOSTATIN ) APPLY TO AFFECTED AREA TWICE A DAY 10/08/19   Viola Greulich, MD  OVER THE COUNTER MEDICATION Take 2 tablets by mouth daily as needed (IC flare ups). cystoprotek    [provider]  oxyCODONE-acetaminophen  (PERCOCET) 10-325 MG tablet Take 1 tablet by mouth every 4 (four) hours as needed for pain.    [provider]  polyethylene glycol (MIRALAX  / GLYCOLAX ) packet Take 17 g by mouth daily. 09/22/14   Street, [place], PA-C  pravastatin  (PRAVACHOL ) 40 MG tablet TAKE 1 TABLET EVERY DAY 03/02/17   Viola Greulich, MD  ranitidine (ZANTAC) 150 MG tablet Take 150-300 mg by mouth daily as needed for heartburn.     [provider]  saxagliptin  HCl (ONGLYZA) 5 MG TABS tablet Take 0.5 tablets (2.5 mg total) by mouth daily.  08/01/17   Gwyndolyn Lerner, MD  Suppository Base MISC 1 capsule by Does not apply route. As needed    [provider]  traZODone (DESYREL) 50 MG tablet  01/19/17   [provider]  triamcinolone  ointment (KENALOG ) 0.1 % Apply 1 application topically daily as needed (rash).  03/03/15   [provider]  vitamin B-12 (CYANOCOBALAMIN) 500 MCG tablet Take 500 mcg by mouth daily.    [provider]      Allergies    Colesevelam , Macrobid [nitrofurantoin monohyd  macro], Nitrofuran derivatives, Sulfa  antibiotics, Brexpiprazole, Codeine, Doxepin, Doxycycline , Duloxetine, Gabapentin, and Pregabalin    Review of Systems   Review of Systems  Neurological:  Positive for dizziness.  All other systems reviewed and are negative.   Physical Exam Updated Vital Signs BP 120/81 (BP Location: Left Arm)   Pulse 86   Temp 98.3 F (36.8 C) (Oral)   Resp 12   Ht 5\' 6"  (1.676 m)   Wt 76.2 kg   LMP 11/12/2010   SpO2 98%   BMI 27.11 kg/m  Physical Exam Vitals and nursing note reviewed. Exam conducted with a chaperone present.  Constitutional:      General: She is not in acute distress.    Appearance: She is well-developed.  HENT:     Head: Normocephalic and atraumatic.  Eyes:     Conjunctiva/sclera: Conjunctivae normal.  Cardiovascular:     Rate and Rhythm: Normal rate and regular rhythm.     Heart sounds: No murmur heard. Pulmonary:     Effort: Pulmonary effort is normal. No respiratory distress.     Breath sounds: Normal breath sounds. No wheezing, rhonchi or rales.  Abdominal:     Palpations: Abdomen is soft.     Tenderness: There is no abdominal tenderness.  Genitourinary:    Comments: Copious amounts of firm light Litzenberger-colored stool appreciated in rectal vault with clay consistency.  Large amount of stool removed until patient no longer able to tolerate disimpaction. Musculoskeletal:        General: No swelling.     Cervical back: Neck supple.  Skin:    General: Skin is warm and dry.     Capillary Refill: Capillary refill takes less than 2 seconds.  Neurological:     Mental Status: She is alert.     Comments: Alert and oriented to self, place, time and event.   Speech is fluent, clear without dysarthria or dysphasia.   Strength 5/5 in upper/lower extremities   Sensation intact in upper/lower extremities   Normal gait.  Negative Romberg. No pronator drift.  Normal finger-to-nose and feet tapping.  CN I not tested  CN II not  tested CN III, IV, VI PERRLA and EOMs intact bilaterally  CN V Intact sensation to sharp and light touch to the face  CN VII facial movements symmetric  CN VIII not tested  CN IX, X no uvula deviation, symmetric rise of soft palate  CN XI 5/5 SCM and trapezius strength bilaterally  CN XII Midline tongue protrusion, symmetric L/R movements   Psychiatric:        Mood and Affect: Mood normal.     ED Results / Procedures / Treatments   Labs (all labs ordered are listed, but only abnormal results are displayed) Labs Reviewed - No data to display  EKG None  Radiology No results found.  Procedures .Fecal disimpaction  Date/Time: 10/12/2023 6:21 PM  Performed by: [place] Butter, PA Authorized by: [place] Butter, PA  Consent: Verbal consent not obtained. Risks and benefits: risks, benefits and alternatives were discussed Consent given by: patient Patient understanding: patient states understanding of the procedure being performed Patient consent: the patient's understanding of the procedure matches consent given Patient identity confirmed: verbally with patient and arm band Time out: Immediately prior to procedure a "time out" was called to verify the correct patient, procedure, equipment, support staff and site/side marked as required. Local anesthesia used: yes  Anesthesia: Local anesthesia used: yes Local Anesthetic: topical anesthetic Anesthetic total: 15 mL  Sedation: Patient sedated: no  Patient tolerance: patient tolerated the procedure well with no immediate complications       Medications Ordered in ED Medications - No data to display  ED Course/ Medical Decision Making/ A&P                                 Medical Decision Making Amount and/or Complexity of Data Reviewed Labs: ordered.  Risk OTC drugs. Prescription drug management.   This patient presents to the ED for concern of rectal pain, this involves an extensive number of treatment  options, and is a complaint that carries with it a high risk of complications and morbidity.  The differential diagnosis includes abscess, rectal fissure, fecal impaction, constipation, rectal prolapse, BPPV, labyrinthitis, Mnire's disease, vertebrobasilar insufficiency, CVA, other   Co morbidities that complicate the patient evaluation  See HPI   Additional history obtained:  Additional history obtained from EMR External records from outside source obtained and reviewed including hospital records   Lab Tests:  I Ordered, and personally interpreted labs.  The pertinent results include:  no leukocytosis. No evidence of anemia. Platelets within normal range. Occult negative. CBG 112.   Imaging Studies ordered:  N/a   Cardiac Monitoring: / EKG:  The patient was maintained on a cardiac monitor.  I personally viewed and interpreted the cardiac monitored which showed an underlying rhythm of: This rhythm without evidence of acute ischemic change from prior EKG performed.   Consultations Obtained:  N/a   Problem List / ED Course / Critical interventions / Medication management  Fecal impaction, constipation, dizziness I ordered medication including Antivert , viscous lidocaine , Zofran , Norco   Reevaluation of the patient after these medicines showed that the patient improved I have reviewed the patients home medicines and have made adjustments as needed   Social Determinants of Health:  Former cigarette use.  Denies illicit drug use.   Test / Admission - Considered:  Fecal impaction, constipation, dizziness Vitals signs within normal range and stable throughout visit. Laboratory/imaging studies significant for: See above 53 year old female presents emergency department with a couple different complaints.  States that she is having rectal fullness.  States she states had a bowel movement for the past week.  Has not tried anything medication for this specifically.  Does  report crampy type abdominal pain.  Was seen in the emergency department for similar complaint/12/25 and was disimpacted with relief.  Patient states this feels similar or slightly worsening rectal pain.  Patient also reports feelings of room spinning dizziness.  This has happened over the past couple of days.  Symptoms exacerbated with positional changes of the head and relieved when laying still.  Denies any visual disturbance, gait abnormality, slurred speech, difficulty swallowing, facial droop, weakness/deficit of lower extremities. On exam, exam unremarkable.  Large amount of clay consistency stool multiple which was improved in manage as above until patient no  longer able to tolerate procedure.  Suspect the patient's symptoms most likely secondary to fecal impaction.  Did a CT scan 6 days ago for the same presentation which showed constipation.  She had not taken any medication specifically for this since prior to discharge.  Will trial MiraLAX  titrated to effect, daily fiber, oral hydration.  Follow-up with primary care/GI recommend in the outpatient setting for reassessment.  Treatment plan discussed with patient and she is understanding was agreeable to said plan.  Patient well-appearing, afebrile in no acute distress. Worrisome signs and symptoms were discussed with the patient, and the patient acknowledged understanding to return to the ED if noticed. Patient was stable upon discharge.          Final Clinical Impression(s) / ED Diagnoses Final diagnoses:  None    Rx / DC Orders ED Discharge Orders     None         [place] Butter, [place] 10/12/23 Rosemary Conrad, MD 10/19/23 [phone]

## 2023-10-12 NOTE — ED Notes (Signed)
 Discharge instructions reviewed with patient. Patient verbalizes understanding, no further questions at this time. Medications/prescriptions and follow up information provided. No acute distress noted at time of departure.

## 2024-02-21 ENCOUNTER — Ambulatory Visit: Admitting: Family Medicine

## 2024-02-29 ENCOUNTER — Ambulatory Visit: Admitting: Family Medicine

## 2024-03-07 ENCOUNTER — Ambulatory Visit: Admitting: Family

## 2024-03-21 ENCOUNTER — Ambulatory Visit: Admitting: Family Medicine

## 2024-04-17 ENCOUNTER — Ambulatory Visit: Admitting: Family Medicine

## 2024-04-17 DIAGNOSIS — Z0000 Encounter for general adult medical examination without abnormal findings: Secondary | ICD-10-CM

## 2024-06-23 ENCOUNTER — Ambulatory Visit (HOSPITAL_BASED_OUTPATIENT_CLINIC_OR_DEPARTMENT_OTHER): Admitting: Family Medicine

## 2024-08-11 ENCOUNTER — Ambulatory Visit: Admitting: Medical

## 2024-08-20 ENCOUNTER — Ambulatory Visit: Admitting: Medical
